# Patient Record
Sex: Male | Born: 1945 | Race: White | Hispanic: No | Marital: Married | Smoking: Former smoker
Health system: Southern US, Community
[De-identification: ages and names within clinical notes are randomized; demographics above are authoritative.]

## PROBLEM LIST (undated history)

## (undated) DIAGNOSIS — K219 Gastro-esophageal reflux disease without esophagitis: Secondary | ICD-10-CM

## (undated) DIAGNOSIS — M159 Polyosteoarthritis, unspecified: Secondary | ICD-10-CM

## (undated) DIAGNOSIS — F419 Anxiety disorder, unspecified: Secondary | ICD-10-CM

## (undated) DIAGNOSIS — M797 Fibromyalgia: Secondary | ICD-10-CM

## (undated) DIAGNOSIS — G4730 Sleep apnea, unspecified: Secondary | ICD-10-CM

## (undated) DIAGNOSIS — N529 Male erectile dysfunction, unspecified: Secondary | ICD-10-CM

## (undated) DIAGNOSIS — Z9889 Other specified postprocedural states: Secondary | ICD-10-CM

## (undated) DIAGNOSIS — IMO0001 Reserved for inherently not codable concepts without codable children: Secondary | ICD-10-CM

## (undated) DIAGNOSIS — I4891 Unspecified atrial fibrillation: Secondary | ICD-10-CM

## (undated) DIAGNOSIS — D126 Benign neoplasm of colon, unspecified: Secondary | ICD-10-CM

## (undated) DIAGNOSIS — H269 Unspecified cataract: Secondary | ICD-10-CM

## (undated) DIAGNOSIS — G4733 Obstructive sleep apnea (adult) (pediatric): Secondary | ICD-10-CM

## (undated) DIAGNOSIS — IMO0002 Reserved for concepts with insufficient information to code with codable children: Secondary | ICD-10-CM

## (undated) HISTORY — DX: Obstructive sleep apnea (adult) (pediatric): G47.33

## (undated) HISTORY — DX: Gastro-esophageal reflux disease without esophagitis: K21.9

## (undated) HISTORY — PX: COLONOSCOPY: SHX174

## (undated) HISTORY — DX: Polyosteoarthritis, unspecified: M15.9

## (undated) HISTORY — PX: POLYPECTOMY: SHX149

## (undated) HISTORY — PX: ROTATOR CUFF REPAIR: SHX139

## (undated) HISTORY — DX: Unspecified cataract: H26.9

## (undated) HISTORY — PX: LUMBAR LAMINECTOMY: SHX95

## (undated) HISTORY — DX: Sleep apnea, unspecified: G47.30

## (undated) HISTORY — DX: Benign neoplasm of colon, unspecified: D12.6

## (undated) HISTORY — DX: Other specified postprocedural states: Z98.890

## (undated) HISTORY — DX: Anxiety disorder, unspecified: F41.9

## (undated) HISTORY — DX: Reserved for inherently not codable concepts without codable children: IMO0001

## (undated) HISTORY — PX: LIPOMA EXCISION: SHX5283

## (undated) HISTORY — PX: UPPER GASTROINTESTINAL ENDOSCOPY: SHX188

## (undated) HISTORY — DX: Male erectile dysfunction, unspecified: N52.9

## (undated) HISTORY — DX: Reserved for concepts with insufficient information to code with codable children: IMO0002

## (undated) HISTORY — PX: CHONDROPLASTY: SHX5177

---

## 1999-02-11 ENCOUNTER — Observation Stay (HOSPITAL_COMMUNITY): Admission: RE | Admit: 1999-02-11 | Discharge: 1999-02-12 | Payer: Self-pay | Admitting: Specialist

## 1999-02-21 ENCOUNTER — Inpatient Hospital Stay (HOSPITAL_COMMUNITY): Admission: EM | Admit: 1999-02-21 | Discharge: 1999-02-25 | Payer: Self-pay | Admitting: Specialist

## 1999-08-19 ENCOUNTER — Ambulatory Visit: Admission: RE | Admit: 1999-08-19 | Discharge: 1999-08-19 | Payer: Self-pay | Admitting: Internal Medicine

## 2003-04-19 ENCOUNTER — Emergency Department (HOSPITAL_COMMUNITY): Admission: EM | Admit: 2003-04-19 | Discharge: 2003-04-19 | Payer: Self-pay | Admitting: Emergency Medicine

## 2003-08-03 ENCOUNTER — Emergency Department (HOSPITAL_COMMUNITY): Admission: EM | Admit: 2003-08-03 | Discharge: 2003-08-03 | Payer: Self-pay | Admitting: Emergency Medicine

## 2004-11-11 ENCOUNTER — Ambulatory Visit: Payer: Self-pay | Admitting: Internal Medicine

## 2005-01-10 ENCOUNTER — Encounter (INDEPENDENT_AMBULATORY_CARE_PROVIDER_SITE_OTHER): Payer: Self-pay | Admitting: *Deleted

## 2005-01-10 ENCOUNTER — Ambulatory Visit (HOSPITAL_COMMUNITY): Admission: RE | Admit: 2005-01-10 | Discharge: 2005-01-10 | Payer: Self-pay | Admitting: Surgery

## 2005-07-01 ENCOUNTER — Ambulatory Visit: Payer: Self-pay | Admitting: Internal Medicine

## 2005-07-03 ENCOUNTER — Ambulatory Visit: Payer: Self-pay | Admitting: Internal Medicine

## 2006-04-20 ENCOUNTER — Encounter: Admission: RE | Admit: 2006-04-20 | Discharge: 2006-04-20 | Payer: Self-pay

## 2006-11-02 ENCOUNTER — Ambulatory Visit: Payer: Self-pay | Admitting: Emergency Medicine

## 2006-11-02 LAB — CONVERTED CEMR LAB
Inflenza A Ag: NEGATIVE
Influenza B Ag: NEGATIVE

## 2007-09-09 DIAGNOSIS — D126 Benign neoplasm of colon, unspecified: Secondary | ICD-10-CM

## 2007-09-09 HISTORY — DX: Benign neoplasm of colon, unspecified: D12.6

## 2007-09-20 ENCOUNTER — Ambulatory Visit: Payer: Self-pay | Admitting: Internal Medicine

## 2007-09-21 ENCOUNTER — Encounter: Payer: Self-pay | Admitting: Internal Medicine

## 2007-09-21 ENCOUNTER — Ambulatory Visit: Payer: Self-pay | Admitting: Internal Medicine

## 2008-04-03 ENCOUNTER — Ambulatory Visit: Payer: Self-pay | Admitting: Internal Medicine

## 2008-04-03 DIAGNOSIS — J449 Chronic obstructive pulmonary disease, unspecified: Secondary | ICD-10-CM

## 2008-04-03 DIAGNOSIS — J309 Allergic rhinitis, unspecified: Secondary | ICD-10-CM | POA: Insufficient documentation

## 2008-04-03 DIAGNOSIS — G4733 Obstructive sleep apnea (adult) (pediatric): Secondary | ICD-10-CM

## 2009-06-07 ENCOUNTER — Ambulatory Visit: Payer: Self-pay | Admitting: Internal Medicine

## 2009-09-11 ENCOUNTER — Ambulatory Visit: Payer: Self-pay | Admitting: Internal Medicine

## 2009-10-08 ENCOUNTER — Encounter: Payer: Self-pay | Admitting: Internal Medicine

## 2009-11-29 ENCOUNTER — Encounter: Payer: Self-pay | Admitting: Internal Medicine

## 2009-12-03 ENCOUNTER — Ambulatory Visit: Payer: Self-pay | Admitting: Internal Medicine

## 2009-12-03 DIAGNOSIS — R079 Chest pain, unspecified: Secondary | ICD-10-CM | POA: Insufficient documentation

## 2009-12-13 ENCOUNTER — Ambulatory Visit: Payer: Self-pay | Admitting: Internal Medicine

## 2009-12-27 ENCOUNTER — Ambulatory Visit: Payer: Self-pay | Admitting: Cardiology

## 2009-12-27 DIAGNOSIS — R0602 Shortness of breath: Secondary | ICD-10-CM

## 2010-01-08 ENCOUNTER — Ambulatory Visit (HOSPITAL_COMMUNITY): Admission: RE | Admit: 2010-01-08 | Discharge: 2010-01-08 | Payer: Self-pay | Admitting: Cardiology

## 2010-01-08 ENCOUNTER — Ambulatory Visit: Payer: Self-pay | Admitting: Cardiology

## 2010-01-21 ENCOUNTER — Telehealth: Payer: Self-pay | Admitting: Cardiology

## 2010-10-06 LAB — CONVERTED CEMR LAB: Pro B Natriuretic peptide (BNP): <30 pg/mL (ref 0.0–100.0)

## 2010-10-08 NOTE — Assessment & Plan Note (Signed)
Summary: np6/ chestpain./ pt has medicare/ gd  Medications Added MULTIVITAMINS   TABS (MULTIPLE VITAMIN) 1 po daily COQ10 30 MG CAPS (COENZYME Q10) 1 by mouth daliy VITAMIN D 1000 UNIT TABS (CHOLECALCIFEROL) 1 podaily PROBIOTIC  CAPS (PROBIOTIC PRODUCT) 1 by mouth daily ZEGERID OTC 20-1100 MG CAPS (OMEPRAZOLE-SODIUM BICARBONATE) 1 by mouth daily PROAIR HFA 108 (90 BASE) MCG/ACT AERS (ALBUTEROL SULFATE) as needed      Allergies Added:   Visit Type:  Initial Consult Referring Provider:  Fannie Knee, MD Primary Provider:  Shary Decamp, MD  CC:  Dyspnea and Chest Pain.  History of Present Illness: The patient presents with chest discomfort. This has been going on for approximately 3-4 weeks. It happens more often in the morning. It is sporadic. He describes a left-sided discomfort that is 3/10 in intensity. It's heavy. It might go away after about 30 minutes. His wife thinks that there is some anxiety involved.  He does not describe associated symptoms such as nausea vomiting or diaphoresis. Presented palpitations, presyncope or syncope. He also has been having shortness of breath for about 3 or 4 months. This is slowly progressive. It happens when he is trying to rise sure when he is walking at least a moderate distance on level ground. He does not describe any PND or orthopnea. He doesn't describe any palpitations, presyncope or syncope. He does sleep with CPAP. His last stress test was a perfusion study in 2002.  Current Medications (verified): 1)  Benefiber   Powd (Wheat Dextrin) .... As Needed 2)  Cpap 12 Cwp Advanced 3)  Vicodin 5-500 Mg Tabs (Hydrocodone-Acetaminophen) .... Take 1 By Mouth Every 6 Hours As Needed Pain 4)  Multivitamins   Tabs (Multiple Vitamin) .Marland Kitchen.. 1 Po Daily 5)  Coq10 30 Mg Caps (Coenzyme Q10) .Marland Kitchen.. 1 By Mouth Daliy 6)  Vitamin D 1000 Unit Tabs (Cholecalciferol) .Marland Kitchen.. 1 Podaily 7)  Probiotic  Caps (Probiotic Product) .Marland Kitchen.. 1 By Mouth Daily 8)  Zegerid Otc 20-1100  Mg Caps (Omeprazole-Sodium Bicarbonate) .Marland Kitchen.. 1 By Mouth Daily 9)  Proair Hfa 108 (90 Base) Mcg/act Aers (Albuterol Sulfate) .... As Needed  Allergies (verified): 1)  ! Pcn 2)  ! Prilosec 3)  ! Hydrocodone 4)  ! Codeine  Past History:  Past Medical History: Last updated: 04/03/2008 Allergic Rhinitis Bronchitis Sleep Apnea  Past Surgical History: Right rotator cuff- staph infection post op Lumbar laminectomy Lipoma from back  Social History: Wife was respiratory therapist, he is disabled from multiple orthopedic problems. Quit smoking- 1996  Review of Systems       Positive for constipation, headaches, 25 pound weight gain in 2 years, situational depression, reflux, joint pains. Otherwise as stated in the history of present illness negative for all other systems.   Vital Signs:  Patient profile:   65 year old male Height:      70.5 inches Weight:      238 pounds BMI:     33.79 Pulse rate:   49 / minute Resp:     18 per minute BP sitting:   148 / 78  (right arm)  Vitals Entered By: Marrion Coy, CNA (December 27, 2009 2:30 PM)  Physical Exam  General:  Well developed, well nourished, in no acute distress. Head:  normocephalic and atraumatic Eyes:  PERRLA/EOM intact; conjunctiva and lids normal. Mouth:  Teeth, gums and palate normal. Oral mucosa normal. Neck:  Neck supple, no JVD. No masses, thyromegaly or abnormal cervical nodes. Chest Wall:  no deformities or breast  masses noted Lungs:  Clear bilaterally to auscultation and percussion. Abdomen:  Bowel sounds positive; abdomen soft and non-tender without masses, organomegaly, or hernias noted, obese Msk:  Back normal, normal gait. Muscle strength and tone normal. Extremities:  No clubbing or cyanosis. Neurologic:  Alert and oriented x 3. Skin:  Intact without lesions or rashes. Cervical Nodes:  no significant adenopathy Axillary Nodes:  no significant adenopathy Inguinal Nodes:  no significant adenopathy Psych:   Normal affect.   Detailed Cardiovascular Exam  Neck    Carotids: Carotids full and equal bilaterally without bruits.      Neck Veins: Normal, no JVD.    Heart    Inspection: no deformities or lifts noted.      Palpation: normal PMI with no thrills palpable.      Auscultation: regular rate and rhythm, S1, S2 without murmurs, rubs, gallops, or clicks.    Vascular    Abdominal Aorta: no palpable masses, pulsations, or audible bruits.      Femoral Pulses: normal femoral pulses bilaterally.      Pedal Pulses: normal pedal pulses bilaterally.      Radial Pulses: normal radial pulses bilaterally.      Peripheral Circulation: no clubbing, cyanosis, or edema noted with normal capillary refill.     EKG  Procedure date:  12/27/2009  Findings:      sinus bradycardia, rate 49, axis within normal limits, RSR prime V1, no acute ST-T wave changes  Impression & Recommendations:  Problem # 1:  CHEST PAIN (ICD-786.50) The patient's chest pain is somewhat atypical. However, he has risk factors including a family history. I would like to perform an exercise treadmill test that he could not ambulate because of his joint pains. Therefore, I would consider pharmacologic perfusion imaging. Orders: EKG w/ Interpretation (93000) Cardiac CTA (Cardiac CTA)  Problem # 2:  SHORTNESS OF BREATH (ICD-786.05) I will check a BNP level. This had the study above are normal then I would not suggest a cardiac etiology for his dyspnea. Rather it would be related to obesity and sedentary lifestyle Orders: EKG w/ Interpretation (93000) T-BNP  (B Natriuretic Peptide) (04540-98119)  Patient Instructions: 1)  Your physician recommends that you schedule a follow-up appointment as directed 2)  Your physician recommends that you have a BNP today 786.05 3)  Your physician recommends that you continue on your current medications as directed. Please refer to the Current Medication list given to you today.

## 2010-10-08 NOTE — Progress Notes (Signed)
Summary: ct scan    Phone Note Call from Patient Call back at Home Phone [phone] Call back at [phone]   Caller: Spouse - patricia Reason for Call: Talk to Nurse, Lab or Test Results Details for Reason: ct scan, pt in a research study.  Initial call taken by: Lorne Skeens,  Jan 21, 2010 4:40 PM  Follow-up for Phone Call        I called the pt's cell # and got the ok to info to his wife. The pt's wife if aware of his results.  Follow-up by: Sherri Rad, RN, BSN,  Jan 21, 2010 5:17 PM

## 2010-10-08 NOTE — Assessment & Plan Note (Signed)
Summary: sob/jd   Primary Provider/Referring Provider:  Shary Decamp  CC:  Accute Visit-Increased SOB-new onset; mild chest pain non raidating and increased pressure. Marland Kitchen  History of Present Illness:  SLEEP APNEA (ICD-780.57) Hx of BRONCHITIS (ICD-491.20) ALLERGIC RHINITIS (ICD-477.9) 04/03/08- 65 year old man returning with his wife for follow-up.  History of sleep apnea, and allergic rhinitis.  Acute visit today because of cough and bronchitis.  They stayed for 3 or 4 days.  He has been coughing up "nasty stuff.".  Denies fever, chills, nausea, and vomiting, adenopathy, and rash.  Was caught in rain and chilled last week, shortly before this began.  June 07, 2009--Presents for an acute office viist. Pt c/o low grade fever, productive cough with yellow mucus, runny nose, and facial pressure  x 2 to 3 days. Complains that symptoms bad last night.  Used otc no help. Denies chest pain, dyspnea, orthopnea, hemoptysis, fever, n/v/d, edema, headache.   September 11, 2009--Presents for for an acute office visit. Complains of sinus pressure/congestion with yellow/ mixed with bright red drainage, HA x3weeks - denies f/c/s. Denies chest pain, dyspnea, orthopnea, hemoptysis, fever, n/v/d, edema, headache,recent travel or antibiotics.   December 03, 2009- OSA, Bronchitis, Allergic rhinitis.......................Marland Kitchenwife here( former Buyer, retail) Wife is more articulate and outgoing than he. She gave up her appointment to get him in today. They report intervals of dyspnea with minor BP interventions with stress. 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 He notes no changes with weather. It seems exertional, noted with sustained walking . This is happening more frequently than when it began 2 months ago, but still episodic.. Gets twinges in left precordial pain- comes and goes, not exertional. He had no problem during 2 week hunting vacation.Drinks  large cups of coffee in AM.   Had normal stress test about 8-9 years ago for chest tightness attributed then to Vioxx. Continues regular compliant use of CPAP at 12.          Preventive Screening-Counseling & Management  Alcohol-Tobacco     Smoking Status: quit  Current Medications (verified): 1)  Benefiber   Powd (Wheat Dextrin) .... As Needed 2)  Cpap-Ahc Set On 12 .... Wear At Bedtime 3)  Vicodin 5-500 Mg Tabs (Hydrocodone-Acetaminophen) .... Take 1 By Mouth Every 6 Hours As Needed Pain  Allergies (verified): 1)  ! Pcn 2)  ! Prilosec 3)  ! Hydrocodone 4)  ! Codeine  Past History:  Past Medical History: Last updated: 04/03/2008 Allergic Rhinitis Bronchitis Sleep Apnea  Family History: Last updated: 12/03/2009 Cancer-mother Family History Diabetes-aunt Mother- died metastatic breast cancer Father- died MI age 33  Social History: Last updated: 12/03/2009 Wife was respiratory therapist, disabled by post-polio syndrome Quit smoking- 1996  Risk Factors: Smoking Status: quit (12/03/2009) Packs/Day: 1.5 (06/07/2009)  Past Surgical History: Right roator cuff- staph infection lumbar laminectomy Lipoma from back  Family History: Cancer-mother Family History Diabetes-aunt Mother- died metastatic breast cancer Father- died MI age 52  Social History: Wife was respiratory therapist, 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  The patient denies anorexia, fever, weight loss, weight gain, vision loss, decreased hearing, hoarseness, syncope, peripheral edema, prolonged cough, headaches, hemoptysis, abdominal pain, and severe indigestion/heartburn.    Vital Signs:  Patient profile:   65 year old male Height:  70.5 inches Weight:      246.38 pounds BMI:     34.98 O2 Sat:      94 % on Room air Pulse rate:   60 / minute BP sitting:   112 / 78   (left arm) Cuff size:   regular  Vitals Entered By: Reynaldo Minium CMA (December 03, 2009 3:39 PM)  O2 Flow:  Room air  Physical Exam  Additional Exam:  GENERAL:  A/Ox3; pleasant & cooperative.NAD, comfortable appearoing HEENT:  [place]/AT, EOM-wnl,  EACs-clear, TMs-wnl, NOSE- red, irritated  THROAT-clear & wnl., tonsils, Mallampati  II NECK:  Supple w/ fair ROM; no JVD; normal carotid impulses w/o bruits; no thyromegaly or nodules palpated; no lymphadenopathy. CHEST: clear to P&A HEART:  RRR, no m/r/g  heard ABDOMEN:  Soft & nt; nml bowel sounds; no organomegaly or masses detected. EXT: Warm bilat,  no calf pain, edema, clubbing, pulses intact Skin: no rash/lesion     Pulmonary Function Test Date: 12/03/2009 4:50 PM Gender: Male  Pre-Spirometry FVC    Value: 8.64 L/min   % Pred: 187.10 % FEV1    Value: 7.81 L     Pred: 3.47 L     % Pred: 225.40 % FEV1/FVC  Value: 90.39 %     % Pred: 120.40 %  Impression & Recommendations:  Problem # 1:  CHEST PAIN (ICD-786.50)  Nonspecific chest pains and dyspnea. I favor stress and some esophagitis. The ddx includes angina and less likely- some bronchitis. We will get spirometry now and schedule a 6 MWT with trial of bronchodilator. He will try Zegerid for a month. Since it is potentially the most acutely dangerous dx, I will ask him to see cardiology for consideration of angina work-up.  Medications Added to Medication List This Visit: 1)  Cpap-ahc Set On 12  .... Wear at bedtime 2)  Cpap 12 Cwp Advanced  3)  Vicodin 5-500 Mg Tabs (Hydrocodone-acetaminophen) .... Take 1 by mouth every 6 hours as needed pain  Other Orders: Est. Patient Level II (64332) Cardiology Referral (Cardiology)  Patient Instructions: 1)  Please schedule a follow-up appointment in 1 year. 2)  See [facility] to schedule cardiology appointment 3)  sample rescue inhlaer: 2 puffs four times a day as needed for shortness of breath 4)  Try an otc acid blocker like Zegerid 5)   Schedule 6 MWT 6)  Office spirometry     [employer] Spirometry  ID: [id number] Patient: Joseph Mcdaniel, Joseph Mcdaniel DOB: 1945-09-17 Age: 65 Years Old Sex: Male Race: White Physician: Young,Clinton Height: 70.5 Weight: 246.38 Smoker: No PPD: 1.5 Has Pacemaker? No Status: Unconfirmed Past Medical History:  Allergic Rhinitis Bronchitis Sleep Apnea  Recorded: 12/03/2009 4:50 PM  Parameter  Measured Predicted %Predicted FVC     8.64        4.62        187.10 FEV1     7.81        3.47        225.40 FEV1%   90.39        75.05        120.40 PEF    16.12        8.92        180.70   Interpretation:

## 2010-10-08 NOTE — Letter (Signed)
Summary: CMN for CPAP Supplies/[facility]  CMN for CPAP Supplies/[facility]   Imported By: Sherian Rein 10/11/2009 13:26:58  _____________________________________________________________________  External Attachment:    Type:   Image     Comment:   External Document

## 2010-10-08 NOTE — Assessment & Plan Note (Signed)
Summary: Acute NP office visit - sinus infection   Primary Provider/Referring Provider:  Dr. Phylliss Bob  CC:  sinus pressure/congestion with yellow/bright red drainage and HA x3weeks - denies f/c/s.  History of Present Illness: Current Problems:  SLEEP APNEA (ICD-780.57) Hx of BRONCHITIS (ICD-491.20) ALLERGIC RHINITIS (ICD-477.9)  65 yo male with known history fo  sleep apnea, and allergic rhinitis.     June 07, 2009--Presents for an acute office viist. Pt c/o low grade fever, productive cough with yellow mucus, runny nose, and facial pressure  x 2 to 3 days. Complains that symptoms bad last night.  Used otc no help. Denies chest pain, dyspnea, orthopnea, hemoptysis, fever, n/v/d, edema, headache.   September 11, 2009--Presents for for an acute office visit. Complains of sinus pressure/congestion with yellow/ mixed with bright red drainage, HA x3weeks - denies f/c/s. Denies chest pain, dyspnea, orthopnea, hemoptysis, fever, n/v/d, edema, headache,recent travel or antibiotics.     Medications Prior to Update: 1)  Darvocet A500 100-500 Mg  Tabs (Propoxyphene N-Apap) .... As Needed 2)  Benefiber   Powd (Wheat Dextrin) .... As Needed 3)  Cpap 4)  Zithromax Z-Pak 250 Mg Tabs (Azithromycin) .... Take As Directed.  Current Medications (verified): 1)  Darvocet A500 100-500 Mg  Tabs (Propoxyphene N-Apap) .... As Needed 2)  Benefiber   Powd (Wheat Dextrin) .... As Needed 3)  Cpap .... Wear At Bedtime  Allergies (verified): 1)  ! Pcn 2)  ! Prilosec 3)  ! Hydrocodone 4)  ! Codeine  Past History:  Past Medical History: Last updated: 04/03/2008 Allergic Rhinitis Bronchitis Sleep Apnea  Family History: Last updated: 06/07/2009 Cancer-mother Family History Diabetes-aunt  Social History: Last updated: 06/07/2009 Wife was respiratory therapist, disabled by post-polio syndrome Patient states former smoker.   Risk Factors: Smoking Status: quit (06/07/2009) Packs/Day: 1.5  (06/07/2009)  Review of Systems      See HPI  Vital Signs:  Patient profile:   65 year old male Height:      70.5 inches Weight:      231.25 pounds BMI:     32.83 O2 Sat:      97 % on Room air Temp:     98.9 degrees F oral Pulse rate:   52 / minute BP sitting:   108 / 64  (left arm) Cuff size:   regular  Vitals Entered By: Boone Master CNA (September 11, 2009 3:29 PM)  O2 Flow:  Room air CC: sinus pressure/congestion with yellow/bright red drainage, HA x3weeks - denies f/c/s Is Patient Diabetic? No Comments Medications reviewed with patient Daytime contact number verified with patient. Boone Master CNA  September 11, 2009 3:29 PM    Physical Exam  Additional Exam:  GENERAL:  A/Ox3; pleasant & cooperative.NAD HEENT:  [place]/AT, EOM-wnl,  EACs-clear, TMs-wnl, NOSE- red, irritated  THROAT-clear & wnl. NECK:  Supple w/ fair ROM; no JVD; normal carotid impulses w/o bruits; no thyromegaly or nodules palpated; no lymphadenopathy. CHEST: Mild rhonchi HEART:  RRR, no m/r/g  heard ABDOMEN:  Soft & nt; nml bowel sounds; no organomegaly or masses detected. EXT: Warm bilat,  no calf pain, edema, clubbing, pulses intact Skin: no rash/lesion     Impression & Recommendations:  Problem # 1:  Hx of BRONCHITIS (ICD-491.20)  Flare with asscoicated sinusitis REC:  Zpack take as directed.  Mucinex DM two times a day as needed cough/congestion  Saline nasal rinses as needed  .Increase fluids.  Please contact office for sooner follow up if symptoms  do not improve or worsen   Orders: Est. Patient Level IV (84696)  Medications Added to Medication List This Visit: 1)  Cpap  .... Wear at bedtime 2)  Zithromax Z-pak 250 Mg Tabs (Azithromycin) .... Take as directed.  Complete Medication List: 1)  Darvocet A500 100-500 Mg Tabs (Propoxyphene n-apap) .... As needed 2)  Benefiber Powd (Wheat dextrin) .... As needed 3)  Cpap  .... Wear at bedtime 4)  Zithromax Z-pak 250 Mg Tabs (Azithromycin)  .... Take as directed.  Patient Instructions: 1)  Zpack take as directed.  2)  Mucinex DM two times a day as needed cough/congestion  3)  Saline nasal rinses as needed  4)  .Increase fluids.  5)  Please contact office for sooner follow up if symptoms do not improve or worsen  Prescriptions: ZITHROMAX Z-PAK 250 MG TABS (AZITHROMYCIN) take as directed.  #1 x 0   Entered and Authorized by:   Rubye Oaks NP   Signed by:   Rubye Oaks NP on 09/11/2009   Method used:   Electronically to        [facility] [address] (retail)       [id number]       [place], [place]  [address]       Ph: [phone]       Fax: [phone]   RxID:   [id number]    Not Administered:    Influenza Vaccine not given due to: declined

## 2010-10-08 NOTE — Assessment & Plan Note (Signed)
Summary: chest & head congestion/apc   Primary Provider/Referring Provider:  Shary Decamp  CC:  Sinus congestion/pressure; yellow drainage x 2days. Sinus headache as well.Marland Kitchen  History of Present Illness: June 07, 2009--Presents for an acute office viist. Pt c/o low grade fever, productive cough with yellow mucus, runny nose, and facial pressure  x 2 to 3 days. Complains that symptoms bad last night.  Used otc no help. Denies chest pain, dyspnea, orthopnea, hemoptysis, fever, n/v/d, edema, headache.   September 11, 2009--Presents for for an acute office visit. Complains of sinus pressure/congestion with yellow/ mixed with bright red drainage, HA x3weeks - denies f/c/s. Denies chest pain, dyspnea, orthopnea, hemoptysis, fever, n/v/d, edema, headache,recent travel or antibiotics.   December 03, 2009- OSA, Bronchitis, Allergic rhinitis.......................Marland Kitchenwife here( former Buyer, retail) Wife is more articulate and outgoing than he. She gave up her appointment to get him in today. They report intervals of dyspnea with minor BP interventions with stress. 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 He notes no changes with weather. It seems exertional, noted with sustained walking . This is happening more frequently than when it began 2 months ago, but still episodic.. Gets twinges in left precordial pain- comes and goes, not exertional. He had no problem during 2 week hunting vacation.Drinks large cups of coffee in AM.   Had normal stress test about 8-9 years ago for chest tightness attributed then to Vioxx. Continues regular compliant use of CPAP at 12.  December 13, 2009- OSA, bronchitis, Allergic rhinitis Returns for f/u dyspnea and chest pains. Acid blocker seems to have relieved this- c/w GERD etiology. Reports sinus pressure, yellow nasal discharge, postnasal drip. Harder to breathe through his nose with his CPAP. This all began during  night yesterday. Denies fever or head congestion.  Current Medications (verified): 1)  Benefiber   Powd (Wheat Dextrin) .... As Needed 2)  Cpap 12 Cwp Advanced 3)  Vicodin 5-500 Mg Tabs (Hydrocodone-Acetaminophen) .... Take 1 By Mouth Every 6 Hours As Needed Pain  Allergies (verified): 1)  ! Pcn 2)  ! Prilosec 3)  ! Hydrocodone 4)  ! Codeine  Past History:  Past Medical History: Last updated: 04/03/2008 Allergic Rhinitis Bronchitis Sleep Apnea  Family History: Last updated: 12/03/2009 Cancer-mother Family History Diabetes-aunt Mother- died metastatic breast cancer Father- died MI age 18  Social History: Last updated: 12/03/2009 Wife was respiratory therapist, disabled by post-polio syndrome Quit smoking- 1996  Risk Factors: Smoking Status: quit (12/03/2009) Packs/Day: 1.5 (06/07/2009)  Past Surgical History: Right rotator cuff- staph infection post op lumbar laminectomy Lipoma from back  Review of Systems      See HPI  The patient denies anorexia, fever, weight loss, weight gain, vision loss, decreased hearing, hoarseness, chest pain, syncope, dyspnea on exertion, peripheral edema, prolonged cough, headaches, hemoptysis, and severe indigestion/heartburn.    Vital Signs:  Patient profile:   65 year old male Height:      70.5 inches Weight:      245.25 pounds BMI:     34.82 O2 Sat:      95 % on Room air Pulse rate:   61 / minute BP sitting:   122 / 80  (left arm) Cuff size:   large  Vitals Entered By: Reynaldo Minium CMA (December 13, 2009 8:59 AM)  O2 Flow:  Room air  Physical Exam  Additional Exam:  GENERAL:  A/Ox3; pleasant & cooperative.NAD, comfortable appearoing HEENT:  [place]/AT, EOM-wnl,  EACs-clear, TMs-wnl, NOSE- moderate turbinate edema with congestion  THROAT-clear & wnl., tonsils, Mallampati  II NECK:  Supple w/ fair ROM; no JVD; normal carotid impulses w/o bruits; no thyromegaly or nodules palpated; no lymphadenopathy. CHEST: clear to P&A HEART:   RRR, no m/r/g  heard ABDOMEN:  Soft & nt; nml bowel sounds; no organomegaly or masses detected. EXT: Warm bilat,  no calf pain, edema, clubbing, pulses intact Skin: no rash/lesion     Impression & Recommendations:  Problem # 1:  ALLERGIC RHINITIS (ICD-477.9)  Acute exacerbation of seasonal rhinitis with probable developing rhinosinusisits. We will give neb neo, depo 80, doxycycline and decongestant antihistamine   Medications Added to Medication List This Visit: 1)  Doxycycline Hyclate 100 Mg Caps (Doxycycline hyclate) .... 2 today then one daily  Other Orders: Est. Patient Level II (16109) Admin of Therapeutic Inj  intramuscular or subcutaneous (60454) Depo- Medrol 80mg  (J1040) Nebulizer Tx (09811)  Patient Instructions: 1)  Please schedule a follow-up appointment in 1 year. 2)  Neb neo nasal 3)  depo 80 4)  script for antibiotic doxycycline sent to drug store 5)  Try an antihistamine-decongestant like claritin-D, or combine an otc decongestamnt like Sudafed-PE with your plain antihistamine. if needed Prescriptions: DOXYCYCLINE HYCLATE 100 MG CAPS (DOXYCYCLINE HYCLATE) 2 today then one daily  #8 x 0   Entered and Authorized by:   Waymon Budge MD   Signed by:   Waymon Budge MD on 12/13/2009   Method used:   Electronically to        [facility] [address] (retail)       [phone]       [place], [place]  [address]       Ph: [phone]       Fax: [phone]   RxID:   [id number]    Medication Administration  Injection # 1:    Medication: Depo- Medrol 80mg     Diagnosis: ALLERGIC RHINITIS (ICD-477.9)    Route: IM    Site: RUOQ gluteus    Exp Date: 07/2012    Lot #: [id number]    Mfr: Pharmacia    Patient tolerated injection without complications    Given by: Reynaldo Minium CMA (December 13, 2009 5:22 PM)  Medication # 1:    Medication: EMR miscellaneous medications    Diagnosis: ALLERGIC RHINITIS (ICD-477.9)    Dose:  3drops    Route: intranasal    Exp Date: 01/2011    Lot #: [id number]    Mfr: bayer    Comments: Neo-Synephrine    Patient tolerated medication without complications    Given by: Reynaldo Minium CMA (December 13, 2009 5:23 PM)  Orders Added: 1)  Est. Patient Level II [95638] 2)  Admin of Therapeutic Inj  intramuscular or subcutaneous [96372] 3)  Depo- Medrol 80mg  [J1040] 4)  Nebulizer Tx [75643]

## 2011-01-21 NOTE — Assessment & Plan Note (Signed)
[facility]                         GASTROENTEROLOGY OFFICE NOTE   HUMZA, TALLERICO                    MRN:          [id number]  DATE:09/20/2007                            DOB:          1945-12-01    [name] is a 65 year old gentleman referred actually by his wife  for evaluation of change in his bowel habits for constipation.  The  change has occurred in the last 8 months.  He had regular bowel habits  until last year, having 1 bowel movement daily.  Now he may go 2 or 3  days without a bowel movement.  He has responded to increasing the fiber  in his diet and taking Benefiber about once a week, drinking juice in  the mornings.  He has never had a colonoscopy.  Mr. Spahr has been  Dr. Harriette Ohara patient and Dr.Tom Phylliss Bob 's patient for fibromyalgia  and degenerative joint disease.  He also has obstructive sleep apnea for  which he uses CPAP at night.  He has asthmatic bronchitis.  He is an ex-  smoker.   MEDICATIONS:  1. Aspirin one p.o. daily.  2. Glucosamine chondroitin daily.  3. Geritol one p.o. daily.  4. Vitamin  antioxidant.  5. Flora Smart Probiotic one p.o. daily.  6. Benefiber every 2 weeks.  7. He also takes Meprozine and Darvocet for pain.   PAST HISTORY:  Significant for:  1. Sleep apnea.  2. He had herniated disk status post laminectomy 1984.  3. Right rotator cuff surgery in 2000.   FAMILY HISTORY:  1. Negative for colon cancer.  2. Mother had breast cancer.  3. Father heart disease.  4. Maternal grandmother leukemia.   SOCIAL HISTORY:  1. Married with 3 stepchildren.  2. He is a retired Visual merchandiser.  In fact, he installed carpets in      our office in 1989.  He blames his profession to having multiple      arthritic complaints, especially in his knees.  He does not smoke      and drinks alcohol only occasionally.   REVIEW OF SYSTEMS:  Positive for reading glasses, allergies, arthritic  joint  pains, sleeping problems, vision changes, back pain, hearing  problems and muscle pain.   PHYSICAL EXAM:  Blood pressure 126/54, pulse 60 and weight 234 pounds.  She was alert, oriented, no distress.  Sclera is nonicteric, oral cavity  was normal.  NECK:  Supple, no adenopathy, no bruit.  LUNGS:  Clear to auscultation.  No wheezes or rales.  COR:  With a quiet precordium, hardly audible S1 S2.  No murmur.  ABDOMEN:  Soft, relaxed with normoactive bowel sounds, no surgical  scars, liver edge at costal margin.  Lower abdomen was normal.  I could  not elicit any tenderness, pain or fullness.  RECTAL EXAM:  Normal extra tones, there was a small amount of brown  Hemoccult negative stool in the ampulla.  EXTREMITIES:  No edema.   IMPRESSION:  A 65 year old gentleman with new onset of constipation  likely related to use of analgesics for control of his DJD as well as  possibly due to decreased activity, again secondary to fibromyalgia.  He  has no risk factors for colon cancer other than his age of 23.  He is a  good candidate for screening colonoscopy since he has never had an exam.   PLAN:  1. We have discussed high fiber diet.  I gave him outline of the high      fiber diet for him to follow.  2. Samples of Benefiber to use on daily basis rather than every 2      weeks.  3. Increase activity as much as possible.  4. Colonoscopy scheduled.  I have discussed the procedure, sedation as      well as prep with the patient.     Joseph Mcdaniel. Juanda Chance, MD  Electronically Signed    DMB/MedQ  DD: 09/20/2007  DT: 09/20/2007  Job #: [id number]   cc:   Areatha Keas, M.D.  Clinton D. Maple Hudson, MD, FCCP, FACP

## 2011-01-24 NOTE — Assessment & Plan Note (Signed)
[facility]                             PULMONARY OFFICE NOTE   Joseph, Mcdaniel                    MRN:          [id number]  DATE:11/02/2006                            DOB:          Dec 06, 1945    SUBJECTIVE:  Joseph Mcdaniel is a 65 year old man followed by Dr. Maple Hudson in  our office for obstructive sleep apnea and allergic rhinitis.  He had  been doing fairly well until yesterday when he began to experience  fevers, myalgias, joint pain and nonproductive cough.  His cough has  persisted and he is now having midsternal pleuritic chest pain when he  coughs.  He has continued to have nausea with some vomiting.  He is also  developing some diarrhea.  He had a fever last night and has had some  chills as well.  He is not currently taking any medications beyond  p.r.n. pain meds.   EXAMINATION:  GENERAL:  This is a slightly ill-appearing but comfortable  gentleman who is in no distress.  His weight is 234 pounds.  Temperature 101.8.  Blood pressure 128/74.  Heart rate 92.  SPO2 98% on room air.  LUNGS:  Are clear to auscultation bilaterally.  He has no crackles or  wheezing.  ABDOMEN:  Is obese, soft, nontender.  Positive bowel sounds.  HEART:  Has a regular rate and rhythm without murmur.  EXTREMITIES:  Have no cyanosis, clubbing or edema.  I do not feel any  joint effusions.   IMPRESSION:  A 65 year old man with an acute viral syndrome with  myalgias, fever, gastrointestinal symptoms and upper respiratory  symptoms,  all of this is consistent with probable influenza.  Since he  had the onset of symptoms only 18 hours ago, I would like to start  Tamiflu for the next 5 days.  We will check an influenza A and B screen  now, otherwise we will treat him symptomatically, including Tylenol and  decongestants for his upper respiratory symptoms as well as Phenergan  for his nausea.  I have asked him to call me back if he is no better by  the end of the  week or if he worsens in any way.     Leslye Peer, MD  Electronically Signed    RSB/MedQ  DD: 11/02/2006  DT: 11/02/2006  Job #: [id number]   cc:   Joni Fears D. Maple Hudson, MD, FCCP, FACP

## 2011-01-24 NOTE — Op Note (Signed)
NAMEEDIEL, UNANGST NO.:  [id number]   MEDICAL RECORD NO.:  [id number]          PATIENT TYPE:  OIB   LOCATION:  2899                         FACILITY:  [facility]   PHYSICIAN:  Currie Paris, M.D.DATE OF BIRTH:  September 18, 1945   DATE OF PROCEDURE:  01/09/2005  DATE OF DISCHARGE:                                 OPERATIVE REPORT   PREOPERATIVE DIAGNOSIS:  Large lipoma right flank.   POSTOPERATIVE DIAGNOSIS:  Large lipoma right flank.   OPERATION:  Removal large right flank lipoma.   SURGEON:  Currie Paris, M.D.   ANESTHESIA:  General anesthesia.   CLINICAL HISTORY:  This patient is a 65 year old gentleman with a large mass  of the right flank which clinically appeared to be lipoma.  Over many years  it has gradually enlarged and is such that it is difficult for him to sleep  because as he rolls on this, it causes discomfort and wakes him up.  It also  gives him a lot of pressure sensations.   DESCRIPTION OF PROCEDURE:  The patient was seen in the holding area and had  no further questions.  The mass was identified by the patient and marked and  initialed by me.   He was taken to the operating room and after satisfactory general  endotracheal anesthesia had been obtained, he was turned on his left side  and appropriately padded.  The area over the lipoma was clipped, prepped and  draped.  The time out occurred.   I made an elliptical incision because he had a lot of excess skin over the  mass.  Subcutaneous tissue was divided and a fairly well circumscribed large  lipoma was identified and easily, bluntly dissected out of the subcutaneous  tissues using a combination of blunt dissection and some cautery.  It came  off of the muscle fascia nicely.  Once it was removed, I infiltrated some  Marcaine both into the muscle and into the subcutaneous tissues around the  scar to see if we could diminish his postoperative pain.   I then spent a long time  closing, using 3-0 Vicryls to tack the subcutaneous  tissue down to the muscle to diminish the likelihood of a postoperative  seroma.  I put about three rows on either side to tack this down. Then the  subcu was closed along the incision line, closing the inferior part of the  incision to the superior part.  Again, tacking the subcu  down to the muscle.  This creates a nice closure.  I then closed the skin  using a 3-0 Monocryl subcuticular plus some Dermabond.  Sterile dressings  were then applied.   The patient tolerated the procedure well.  There were no operative  complications.  All counts were correct.      CJS/MEDQ  D:  01/10/2005  T:  01/10/2005  Job:  [id number]   cc:   Joni Fears D. Maple Hudson, M.D.

## 2011-10-23 ENCOUNTER — Telehealth: Payer: Self-pay | Admitting: Internal Medicine

## 2011-10-23 NOTE — Telephone Encounter (Signed)
Spoke with Dennie Bible at Dr. Dimas Millin office ([facility]). Patient was seen today for constipation and was started on Miralax daily. Wants to be scheduled before next available. Scheduled with Mike Gip, PA on 10/28/11 at 9:45/10;00 AM. She will fax records

## 2011-10-28 ENCOUNTER — Ambulatory Visit: Payer: Self-pay | Admitting: Physician Assistant

## 2011-10-29 ENCOUNTER — Other Ambulatory Visit (INDEPENDENT_AMBULATORY_CARE_PROVIDER_SITE_OTHER): Payer: Medicare Other

## 2011-10-29 ENCOUNTER — Encounter: Payer: Self-pay | Admitting: Physician Assistant

## 2011-10-29 ENCOUNTER — Ambulatory Visit (INDEPENDENT_AMBULATORY_CARE_PROVIDER_SITE_OTHER): Payer: Medicare Other | Admitting: Physician Assistant

## 2011-10-29 DIAGNOSIS — R198 Other specified symptoms and signs involving the digestive system and abdomen: Secondary | ICD-10-CM

## 2011-10-29 DIAGNOSIS — Z86010 Personal history of colonic polyps: Secondary | ICD-10-CM

## 2011-10-29 LAB — TSH: TSH: 1.17 u[IU]/mL (ref 0.35–5.50)

## 2011-10-29 MED ORDER — PEG-KCL-NACL-NASULF-NA ASC-C 100 G PO SOLR: 1.0000 | Freq: Once | ORAL | Status: AC

## 2011-10-29 NOTE — Progress Notes (Signed)
Subjective:    Patient ID: Joseph Mcdaniel, male    DOB: 1946/05/22, 66 y.o.   MRN: [id number]  HPI Joseph Mcdaniel is a 66 year old male known to Dr. Juanda Chance  from prior colonoscopy which was done in January of 2009. He did have one adenomatous polyp removed and was planned for 5 year followup. He comes in today because of a change in his bowel habits over the past 2 months. His wife gives most of the history and states that he has had a significant change in his bowel habits at least over the past 2 months. He had always been very regular but is at this point requiring MiraLax on a daily basis. MiraLax appears to be working and he is having a bowel movement almost every day but has noticed that he is passing ribbonlike stools. He has also had some mild left-sided abdominal discomfort which seems to be worse when his bowels are not moving. He has not noted any melena or hematochezia. His appetite has been fine however he has lost about 30 pounds over the past 6 months. His wife states they have been trying to diet some to lose weight but she feels that this is excessive given they have not been very strict with the diet .Patient had also had some recent hematospermia   and had been seen by Dr. Patsi Sears in January and treated with a 21 day course of Bactrim. He is due for followup there soon.    Review of Systems  Constitutional: Positive for unexpected weight change.  HENT: Negative.   Eyes: Negative.   Respiratory: Negative.   Cardiovascular: Negative.   Gastrointestinal: Positive for abdominal pain and constipation.  Genitourinary: Positive for hematuria.  Musculoskeletal: Negative.   Neurological: Negative.   Hematological: Negative.   Psychiatric/Behavioral: Negative.    Outpatient Encounter Prescriptions as of 10/29/2011  Medication Sig Dispense Refill  . bisacodyl (DULCOLAX) 10 MG suppository Place 10 mg rectally as needed.      . docusate sodium (DULCOLAX) 100 MG capsule Take 100 mg by mouth  2 (two) times daily. As needed.      . Garlic (GARLIQUE) 400 MG TBEC Take by mouth. Take 1 tablet delayed release once a day.      . Multiple Vitamin (MULTIVITAMIN) tablet Take 1 tablet by mouth daily.      . Omega-3 Fatty Acids (FISH OIL) 1000 MG CAPS Take by mouth. Take 1 capsule once daily.      Maxwell Caul Bicarbonate (ZEGERID) 20-1100 MG CAPS Take 1 capsule by mouth daily before breakfast. Take 1 capsule as needed once a day.      . peg 3350 powder (MOVIPREP) 100 G SOLR Take 1 kit (100 g total) by mouth once.  1 kit  0    Allergies  Allergen Reactions  . Codeine   . Hydrocodone   . Omeprazole   . Penicillins       Patient Active Problem List  Diagnoses  . ALLERGIC RHINITIS  . BRONCHITIS  . SLEEP APNEA  . SHORTNESS OF BREATH  . CHEST PAIN  . H/O adenomatous polyp of colon    Objective:   Physical Exam that his wife. Blood pressure 104/62 pulse 60 weight 227. HEENT; nontraumatic, normocephalic, EOM,I PERRLA sclera anicteric,Neck; Supple no JVD, Cardiovascular; regular rate and rhythm with S1-S2 no murmur gallop, Pulmonary; clear bilaterally, Abdomen; soft bowel sounds are active he Has  mild tenderness in the left mid quadrant, left lower quadrant, there is no guarding no  rebound, no palpable mass or hepatosplenomegaly, Rectal; not done, Extremities; no clubbing cyanosis or edema, Psych; mood and affect normal and appropriate.        Assessment & Plan:  #40 66 year old male with history of adenomatous colon polyps last colonoscopy January 2009, now presenting with an abrupt change in his bowel habits over the past 2 months with onset of significant constipation and mild left-sided abdominal discomfort. This is in the setting of a semi-unintentional weight loss of 30 pounds over the past 6 months. Etiology of his constipation is not clear however need to rule out an occult colon lesion. #2 history of sleep apnea patient uses CPAP at night #3 recent history of  hematospermia urologic workup in progress  Plan; scheduled for colonoscopy with Dr. Lina Sar, procedure discussed in detail with the patient and his wife and they are anxious to proceed. Continue MiraLax on a daily basis If colonoscopy is unrevealing he will need CT scan of the abdomen and pelvis for further evaluation

## 2011-10-29 NOTE — Patient Instructions (Signed)
Please go to the basement level to have your labs drawn.   We have scheduled the Colonoscopy with Dr. Lina Sar. Directions and brochure provided.                                                                   COLONOSCOPY A colonoscopy is an exam to evaluate your entire colon. In this exam, your colon is cleansed. A long fiberoptic tube is inserted through your rectum and into your colon. The fiberoptic scope (endoscope) is a long bundle of enclosed and very flexible fibers. These fibers transmit light to the area examined and send images from that area to your caregiver. Discomfort is usually minimal. You may be given a drug to help you sleep (sedative) during or prior to the procedure. This exam helps to detect lumps (tumors), polyps, inflammation, and areas of bleeding. Your caregiver may also take a small piece of tissue (biopsy) that will be examined under a microscope. LET YOUR CAREGIVER KNOW ABOUT:   Allergies to food or medicine.   Medicines taken, including vitamins, herbs, eyedrops, over-the-counter medicines, and creams.   Use of steroids (by mouth or creams).   Previous problems with anesthetics or numbing medicines.   History of bleeding problems or blood clots.   Previous surgery.   Other health problems, including diabetes and kidney problems.   Possibility of pregnancy, if this applies.  BEFORE THE PROCEDURE   A clear liquid diet may be required for 2 days before the exam.   Ask your caregiver about changing or stopping your regular medications.   Liquid injections (enemas) or laxatives may be required.   A large amount of electrolyte solution may be given to you to drink over a short period of time. This solution is used to clean out your colon.   You should be present 60 minutes prior to your procedure or as directed by your caregiver.  AFTER THE PROCEDURE   If you received a sedative or pain relieving medication, you will need to arrange for someone to  drive you home.   Occasionally, there is a little blood passed with the first bowel movement. Do not be concerned.  FINDING OUT THE RESULTS OF YOUR TEST Not all test results are available during your visit. If your test results are not back during the visit, make an appointment with your caregiver to find out the results. Do not assume everything is normal if you have not heard from your caregiver or the medical facility. It is important for you to follow up on all of your test results. HOME CARE INSTRUCTIONS   It is not unusual to pass moderate amounts of gas and experience mild abdominal cramping following the procedure. This is due to air being used to inflate your colon during the exam. Walking or a warm pack on your belly (abdomen) may help.   You may resume all normal meals and activities after sedatives and medicines have worn off.   Only take over-the-counter or prescription medicines for pain, discomfort, or fever as directed by your caregiver. Do not use aspirin or blood thinners if a biopsy was taken. Consult your caregiver for medicine usage if biopsies were taken.  SEEK IMMEDIATE MEDICAL CARE IF:   You have a fever.  You pass large blood clots or fill a toilet with blood following the procedure. This may also occur 10 to 14 days following the procedure. This is more likely if a biopsy was taken.   You develop abdominal pain that keeps getting worse and cannot be relieved with medicine.  Document Released: 08/22/2000 Document Revised: 05/07/2011 Document Reviewed: 04/06/2008 [facility] Patient Information 2012 [place], [place].

## 2011-10-29 NOTE — Progress Notes (Signed)
Reviewed and agree.

## 2011-11-05 ENCOUNTER — Encounter: Payer: Self-pay | Admitting: Internal Medicine

## 2011-11-05 ENCOUNTER — Ambulatory Visit (AMBULATORY_SURGERY_CENTER): Payer: Medicare Other | Admitting: Internal Medicine

## 2011-11-05 ENCOUNTER — Other Ambulatory Visit (INDEPENDENT_AMBULATORY_CARE_PROVIDER_SITE_OTHER): Payer: Medicare Other

## 2011-11-05 ENCOUNTER — Other Ambulatory Visit: Payer: Self-pay

## 2011-11-05 VITALS — BP 138/74 | HR 46 | Temp 97.4°F | Resp 20 | Ht 70.0 in | Wt 227.0 lb

## 2011-11-05 DIAGNOSIS — R194 Change in bowel habit: Secondary | ICD-10-CM

## 2011-11-05 DIAGNOSIS — R198 Other specified symptoms and signs involving the digestive system and abdomen: Secondary | ICD-10-CM

## 2011-11-05 DIAGNOSIS — Z86010 Personal history of colonic polyps: Secondary | ICD-10-CM

## 2011-11-05 DIAGNOSIS — D126 Benign neoplasm of colon, unspecified: Secondary | ICD-10-CM

## 2011-11-05 DIAGNOSIS — Z1211 Encounter for screening for malignant neoplasm of colon: Secondary | ICD-10-CM

## 2011-11-05 DIAGNOSIS — R634 Abnormal weight loss: Secondary | ICD-10-CM

## 2011-11-05 DIAGNOSIS — K6389 Other specified diseases of intestine: Secondary | ICD-10-CM

## 2011-11-05 LAB — COMPREHENSIVE METABOLIC PANEL
ALT: 19 U/L (ref 0–53)
AST: 17 U/L (ref 0–37)
Albumin: 3.8 g/dL (ref 3.5–5.2)
Alkaline Phosphatase: 57 U/L (ref 39–117)
BUN: 11 mg/dL (ref 6–23)
CO2: 29 mEq/L (ref 19–32)
Calcium: 8.2 mg/dL — ABNORMAL LOW (ref 8.4–10.5)
Chloride: 106 mEq/L (ref 96–112)
Creatinine, Ser: 1 mg/dL (ref 0.4–1.5)
GFR: 82.54 mL/min (ref >60.00)
Glucose, Bld: 81 mg/dL (ref 70–99)
Potassium: 4.3 mEq/L (ref 3.5–5.1)
Sodium: 141 mEq/L (ref 135–145)
Total Bilirubin: 0.5 mg/dL (ref 0.3–1.2)
Total Protein: 6.6 g/dL (ref 6.0–8.3)

## 2011-11-05 LAB — CBC WITH DIFFERENTIAL/PLATELET
Basophils Absolute: 0 10*3/uL (ref 0.0–0.1)
Basophils Relative: 0.3 % (ref 0.0–3.0)
Eosinophils Absolute: 0.3 10*3/uL (ref 0.0–0.7)
Eosinophils Relative: 3.4 % (ref 0.0–5.0)
HCT: 42.8 % (ref 39.0–52.0)
Hemoglobin: 14.3 g/dL (ref 13.0–17.0)
Lymphocytes Relative: 24.7 % (ref 12.0–46.0)
Lymphs Abs: 2 10*3/uL (ref 0.7–4.0)
MCHC: 33.5 g/dL (ref 30.0–36.0)
MCV: 90.4 fl (ref 78.0–100.0)
Monocytes Absolute: 0.4 10*3/uL (ref 0.1–1.0)
Monocytes Relative: 5.3 % (ref 3.0–12.0)
Neutro Abs: 5.3 10*3/uL (ref 1.4–7.7)
Neutrophils Relative %: 66.3 % (ref 43.0–77.0)
Platelets: 173 10*3/uL (ref 150.0–400.0)
RBC: 4.73 Mil/uL (ref 4.22–5.81)
RDW: 13.4 % (ref 11.5–14.6)
WBC: 7.9 10*3/uL (ref 4.5–10.5)

## 2011-11-05 LAB — SEDIMENTATION RATE: Sed Rate: 6 mm/hr (ref 0–22)

## 2011-11-05 MED ORDER — SODIUM CHLORIDE 0.9 % IV SOLN: 500.0000 mL | INTRAVENOUS | Status: DC

## 2011-11-05 NOTE — Progress Notes (Signed)
Darcey Nora, RN FROM GI IN TO SEE PATIENT WITH INSTRUCTIONS FOR ABD CT SCAN. NO FLUIDS GIVEN PRIOR TO LABWORK.

## 2011-11-05 NOTE — Patient Instructions (Signed)
YOU HAD AN ENDOSCOPIC PROCEDURE TODAY AT THE [facility]: Refer to the procedure report that was given to you for any specific questions about what was found during the examination.  If the procedure report does not answer your questions, please call your gastroenterologist to clarify.  If you requested that your care partner not be given the details of your procedure findings, then the procedure report has been included in a sealed envelope for you to review at your convenience later.  YOU SHOULD EXPECT: Some feelings of bloating in the abdomen. Passage of more gas than usual.  Walking can help get rid of the air that was put into your GI tract during the procedure and reduce the bloating. If you had a lower endoscopy (such as a colonoscopy or flexible sigmoidoscopy) you may notice spotting of blood in your stool or on the toilet paper. If you underwent a bowel prep for your procedure, then you may not have a normal bowel movement for a few days.  DIET: Your first meal following the procedure should be a light meal and then it is ok to progress to your normal diet.  A half-sandwich or bowl of soup is an example of a good first meal.  Heavy or fried foods are harder to digest and may make you feel nauseous or bloated.  Likewise meals heavy in dairy and vegetables can cause extra gas to form and this can also increase the bloating.  Drink plenty of fluids but you should avoid alcoholic beverages for 24 hours.  ACTIVITY: Your care partner should take you home directly after the procedure.  You should plan to take it easy, moving slowly for the rest of the day.  You can resume normal activity the day after the procedure however you should NOT DRIVE or use heavy machinery for 24 hours (because of the sedation medicines used during the test).    SYMPTOMS TO REPORT IMMEDIATELY: A gastroenterologist can be reached at any hour.  During normal business hours, 8:30 AM to 5:00 PM Monday through Friday,  call [phone].  After hours and on weekends, please call the GI answering service at [phone] who will take a message and have the physician on call contact you.   Following lower endoscopy (colonoscopy or flexible sigmoidoscopy):  Excessive amounts of blood in the stool  Significant tenderness or worsening of abdominal pains  Swelling of the abdomen that is new, acute  Fever of 100F or higher  Following upper endoscopy (EGD)  Vomiting of blood or coffee ground material  New chest pain or pain under the shoulder blades  Painful or persistently difficult swallowing  New shortness of breath  Fever of 100F or higher  Black, tarry-looking stools  FOLLOW UP: If any biopsies were taken you will be contacted by phone or by letter within the next 1-3 weeks.  Call your gastroenterologist if you have not heard about the biopsies in 3 weeks.  Our staff will call the home number listed on your records the next business day following your procedure to check on you and address any questions or concerns that you may have at that time regarding the information given to you following your procedure. This is a courtesy call and so if there is no answer at the home number and we have not heard from you through the emergency physician on call, we will assume that you have returned to your regular daily activities without incident.  SIGNATURES/CONFIDENTIALITY: You and/or your care   partner have signed paperwork which will be entered into your electronic medical record.  These signatures attest to the fact that that the information above on your After Visit Summary has been reviewed and is understood.  Full responsibility of the confidentiality of this discharge information lies with you and/or your care-partner.  

## 2011-11-05 NOTE — Progress Notes (Signed)
Patient did not have preoperative order for IV antibiotic SSI prophylaxis. (G8918)  Patient did not experience any of the following events: a burn prior to discharge; a fall within the facility; wrong site/side/patient/procedure/implant event; or a hospital transfer or hospital admission upon discharge from the facility. (G8907)  

## 2011-11-05 NOTE — Progress Notes (Signed)
Patient and [facility] recovery aware of the need for labs today and CT scan scheduled for 11/11/11 1:30

## 2011-11-05 NOTE — Op Note (Signed)
[facility] [address] [place], [place]  [address]  COLONOSCOPY PROCEDURE REPORT  PATIENT:  Joseph Mcdaniel, Joseph Mcdaniel  MR#:  [id number] BIRTHDATE:  1946/02/07, 66 yrs. old  GENDER:  male ENDOSCOPIST:  Hedwig Morton. Juanda Chance, MD REF. BY:  Vernie Ammons, M.D. 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 Digital rectal exam was performed and revealed no rectal masses. The [id number] endoscope was introduced through the anus and advanced to the cecum, which was identified by both the appendix and ileocecal valve.  The quality of the prep was good, using MoviPrep.  The instrument was then slowly withdrawn as the colon was fully examined. <<PROCEDUREIMAGES>>  FINDINGS:  A lipoma was found. at 30 cm, 20 mm submucosal nodule c/w lipoma With standard forceps, biopsy was obtained and sent to pathology (see image6 and image7).  Two polyps were found. 1-2 mm polyps in the rectum The polyps were removed using cold biopsy forceps (see image9 and image8).  This was otherwise a normal examination of the colon (see image1, image2, image3, image4, and image5).   Retroflexed views in the rectum revealed no abnormalities.    The scope was then withdrawn from the patient and the procedure completed.  COMPLICATIONS:  None ENDOSCOPIC IMPRESSION: 1) Lipoma 2) Two polyps 3) Otherwise normal examination RECOMMENDATIONS: 1) Await pathology results 2) High fiber diet.  REPEAT EXAM:  In 5 - 7 year(s) for.  ______________________________ Hedwig Morton. Juanda Chance, MD  CC:  n. eSIGNED:   Hedwig Morton. Juanita Devincent at 11/05/2011 12:48 PM  Blane Ohara, [phone]

## 2011-11-06 ENCOUNTER — Telehealth: Payer: Self-pay | Admitting: *Deleted

## 2011-11-06 NOTE — Telephone Encounter (Signed)
Left message on answering machine to call for questions or concerns.

## 2011-11-11 ENCOUNTER — Ambulatory Visit (INDEPENDENT_AMBULATORY_CARE_PROVIDER_SITE_OTHER)
Admission: RE | Admit: 2011-11-11 | Discharge: 2011-11-11 | Disposition: A | Discharge status: Home / Self Care | Payer: Medicare Other | Source: Ambulatory Visit | Attending: Internal Medicine | Admitting: Internal Medicine

## 2011-11-11 DIAGNOSIS — R194 Change in bowel habit: Secondary | ICD-10-CM

## 2011-11-11 DIAGNOSIS — R198 Other specified symptoms and signs involving the digestive system and abdomen: Secondary | ICD-10-CM

## 2011-11-11 DIAGNOSIS — R634 Abnormal weight loss: Secondary | ICD-10-CM

## 2011-11-11 MED ORDER — IOHEXOL 300 MG/ML  SOLN
100.0000 mL | Freq: Once | INTRAMUSCULAR | Status: AC | PRN
  Administered 2011-11-11: 100 mL via INTRAVENOUS

## 2011-11-12 ENCOUNTER — Telehealth: Payer: Self-pay | Admitting: Internal Medicine

## 2011-11-12 ENCOUNTER — Encounter: Payer: Self-pay | Admitting: Internal Medicine

## 2011-11-12 NOTE — Telephone Encounter (Signed)
Spoke with patient's wife and gave her results and recommendations. She does not wish to set up a nutrition consult. Per her request, sent copy of CT to Dr. Patsi Sears and Dr. Ronne Binning.

## 2011-11-12 NOTE — Telephone Encounter (Signed)
Spoke with patient's wife. She states they are anxious about CT results. Can be reached at [phone] when results are available.

## 2011-11-12 NOTE — Telephone Encounter (Signed)
Please call pt with results of CT scan - no active disease, small hemangioma in the liver was present before and has not changed.weight loss unexplained, it was initially intentional. Please offer for him to meet with the nutritionist to assess his caloric needs to maintain weight.

## 2012-08-08 DIAGNOSIS — I4891 Unspecified atrial fibrillation: Secondary | ICD-10-CM

## 2012-08-08 HISTORY — DX: Unspecified atrial fibrillation: I48.91

## 2012-08-30 ENCOUNTER — Emergency Department (HOSPITAL_COMMUNITY): Payer: Medicare Other

## 2012-08-30 ENCOUNTER — Observation Stay (HOSPITAL_COMMUNITY)
Admission: EM | Admit: 2012-08-30 | Discharge: 2012-08-31 | DRG: 310 | Disposition: A | Discharge status: Home / Self Care | Payer: Medicare Other | Attending: Internal Medicine | Admitting: Internal Medicine

## 2012-08-30 ENCOUNTER — Encounter (HOSPITAL_COMMUNITY): Payer: Self-pay | Admitting: *Deleted

## 2012-08-30 DIAGNOSIS — K219 Gastro-esophageal reflux disease without esophagitis: Secondary | ICD-10-CM | POA: Insufficient documentation

## 2012-08-30 DIAGNOSIS — I4891 Unspecified atrial fibrillation: Principal | ICD-10-CM

## 2012-08-30 DIAGNOSIS — G4733 Obstructive sleep apnea (adult) (pediatric): Secondary | ICD-10-CM

## 2012-08-30 DIAGNOSIS — R079 Chest pain, unspecified: Secondary | ICD-10-CM

## 2012-08-30 DIAGNOSIS — J449 Chronic obstructive pulmonary disease, unspecified: Secondary | ICD-10-CM

## 2012-08-30 DIAGNOSIS — Z9989 Dependence on other enabling machines and devices: Secondary | ICD-10-CM

## 2012-08-30 DIAGNOSIS — R0789 Other chest pain: Secondary | ICD-10-CM | POA: Insufficient documentation

## 2012-08-30 DIAGNOSIS — F1220 Cannabis dependence, uncomplicated: Secondary | ICD-10-CM | POA: Insufficient documentation

## 2012-08-30 HISTORY — DX: Fibromyalgia: M79.7

## 2012-08-30 HISTORY — DX: Unspecified atrial fibrillation: I48.91

## 2012-08-30 LAB — CBC WITH DIFFERENTIAL/PLATELET
Basophils Absolute: 0 10*3/uL (ref 0.0–0.1)
Basophils Relative: 0 % (ref 0–1)
Eosinophils Absolute: 0.4 10*3/uL (ref 0.0–0.7)
MCH: 30.9 pg (ref 26.0–34.0)
MCHC: 34.4 g/dL (ref 30.0–36.0)
Neutro Abs: 6.3 10*3/uL (ref 1.7–7.7)
Neutrophils Relative %: 60 % (ref 43–77)
Platelets: 190 10*3/uL (ref 150–400)
RDW: 12.9 % (ref 11.5–15.5)

## 2012-08-30 LAB — URINALYSIS, ROUTINE W REFLEX MICROSCOPIC
Ketones, ur: NEGATIVE mg/dL
Leukocytes, UA: NEGATIVE
Protein, ur: NEGATIVE mg/dL
Urobilinogen, UA: 1 mg/dL (ref 0.0–1.0)

## 2012-08-30 LAB — MAGNESIUM: Magnesium: 2.1 mg/dL (ref 1.5–2.5)

## 2012-08-30 LAB — BASIC METABOLIC PANEL
BUN: 16 mg/dL (ref 6–23)
Calcium: 8.9 mg/dL (ref 8.4–10.5)
Creatinine, Ser: 1.08 mg/dL (ref 0.50–1.35)
GFR calc Af Amer: 81 mL/min — ABNORMAL LOW (ref >90)
GFR calc non Af Amer: 70 mL/min — ABNORMAL LOW (ref >90)
Potassium: 3.9 mEq/L (ref 3.5–5.1)

## 2012-08-30 LAB — RAPID URINE DRUG SCREEN, HOSP PERFORMED
Opiates: NOT DETECTED
Tetrahydrocannabinol: POSITIVE — AB

## 2012-08-30 LAB — D-DIMER, QUANTITATIVE: D-Dimer, Quant: 0.52 ug/mL-FEU — ABNORMAL HIGH (ref 0.00–0.48)

## 2012-08-30 MED ORDER — SODIUM CHLORIDE 0.9 % IV BOLUS (SEPSIS)
1000.0000 mL | Freq: Once | INTRAVENOUS | Status: AC
  Administered 2012-08-30: 1000 mL via INTRAVENOUS

## 2012-08-30 MED ORDER — ASPIRIN 81 MG PO CHEW
162.0000 mg | CHEWABLE_TABLET | Freq: Once | ORAL | Status: AC
  Administered 2012-08-30: 162 mg via ORAL
  Filled 2012-08-30: qty 2

## 2012-08-30 MED ORDER — IOHEXOL 350 MG/ML SOLN
100.0000 mL | Freq: Once | INTRAVENOUS | Status: AC | PRN
  Administered 2012-08-30: 100 mL via INTRAVENOUS

## 2012-08-30 NOTE — ED Provider Notes (Addendum)
History     CSN: [id number]  Arrival date & time 08/30/12  2043   First MD Initiated Contact with Patient 08/30/12 2105      Chief Complaint  Patient presents with  . Chest Pain  . Irregular Heart Beat    (Consider location/radiation/quality/duration/timing/severity/associated sxs/prior treatment) HPI Comments: 66 y/o male comes in with cc of chest pain and palpitations. Pt has no medical hx, does have family hx of CAD. States that he started having palpitations around 5 pm, unprovoked. Associated with the palpitations he had some chest pain, midsternal, and back pain. He has no sob, dixxiness, diophoresis. No hx of HTN, no hx of MS disorders and no hx of illicit use. The pain was constant pressure like pain, with no aggravating or relieving factors. No hx of PE, DV, and no risk factors for the same.   Patient is a 66 y.o. male presenting with chest pain. The history is provided by the patient and the spouse.  Chest Pain Primary symptoms include shortness of breath and dizziness. Pertinent negatives for primary symptoms include no fever, no cough and no palpitations.     Past Medical History  Diagnosis Date  . GERD (gastroesophageal reflux disease)   . ED (erectile dysfunction)   . Asthma   . Anxiety   . Vitamin D deficiency   . OSA (obstructive sleep apnea)     Uses CPAP machine  . Generalized OA   . DDD (degenerative disc disease)   . Myalgia and myositis   . Colon polyp     Past Surgical History  Procedure Date  . Rotator cuff tear and repair     Right side  . Lumbar laminectomy   . Lipoma from back     Family History  Problem Relation Age of Onset  . Breast cancer Mother     Metastatic  . Diabetes Maternal Aunt   . Heart attack Father     Died at age 31  . Colon cancer Neg Hx     History  Substance Use Topics  . Smoking status: Former Games developer  . Smokeless tobacco: Never Used     Comment: Quit smoking 1996  . Alcohol Use: 1.8 oz/week    3 Cans of  beer per week     Comment: occ beer      Review of Systems  Constitutional: Negative for fever, chills and activity change.  HENT: Negative for neck pain.   Eyes: Negative for visual disturbance.  Respiratory: Positive for chest tightness and shortness of breath. Negative for cough.   Cardiovascular: Positive for chest pain. Negative for palpitations and leg swelling.  Gastrointestinal: Negative for abdominal distention.  Genitourinary: Negative for dysuria, enuresis and difficulty urinating.  Musculoskeletal: Negative for arthralgias.  Neurological: Positive for dizziness. Negative for light-headedness and headaches.  Psychiatric/Behavioral: Negative for confusion.    Allergies  Codeine; Hydrocodone; Omeprazole; and Penicillins  Home Medications   Current Outpatient Rx  Name  Route  Sig  Dispense  Refill  . TADALAFIL 5 MG PO TABS   Oral   Take 5 mg by mouth daily as needed. Erectile dysfunction           BP 103/69  Pulse 63  Resp 19  SpO2 98%  Physical Exam  Nursing note and vitals reviewed. Constitutional: He is oriented to person, place, and time. He appears well-developed.  HENT:  Head: Normocephalic and atraumatic.  Eyes: Conjunctivae normal and EOM are normal. Pupils are equal, round, and reactive  to light.  Neck: Normal range of motion. Neck supple. No JVD present.  Cardiovascular: Normal heart sounds.   No murmur heard.      tachycardia  Pulmonary/Chest: Effort normal and breath sounds normal. No respiratory distress. He has no wheezes.  Abdominal: Soft. Bowel sounds are normal. He exhibits no distension. There is no tenderness. There is no rebound and no guarding.  Neurological: He is alert and oriented to person, place, and time.  Skin: Skin is warm.    ED Course  Procedures (including critical care time)   Labs Reviewed  CBC WITH DIFFERENTIAL  BASIC METABOLIC PANEL  TROPONIN I  URINALYSIS, ROUTINE W REFLEX MICROSCOPIC  URINE RAPID DRUG SCREEN  (HOSP PERFORMED)  MAGNESIUM  D-DIMER, QUANTITATIVE   No results found.   No diagnosis found.    MDM   Date: 08/30/2012  Rate: 150  Rhythm: atrial fibrillation  QRS Axis: left  Intervals: normal  ST/T Wave abnormalities: nonspecific ST/T changes  Conduction Disutrbances:none  Narrative Interpretation:   Old EKG Reviewed: changes noted New afib, rsr prime v2  Pt comes in with cc of palpitations, chest pain, back pain. Pt noted to be in afib with rvr, no hx of same.  Pt's CHAD2 score is 0, we will give ASA. Pt has no striking etiology for afib on hx or exam though. We will get basic workup. Given the rsr primes, we will get dimer. Pt has chest pain, with radiation to the back, mild at my evaluation - dissection in the ddx. Bilateral BP are normal, and there is no other complain besides the pain. Dimer has shown sensitivity to dissection as well - so it will act as a screening test for PE and Dissection. CXR ordered, hydration started.  10:05 PM Pt went into sinus rhythm spontaneously. We will continue with the current workup though. Also of note - pt had a stress 5 yrs ago - and it was normal.     Derwood Kaplan, MD 08/30/12 2206   Date: 08/30/2012  Rate: 69  Rhythm: normal sinus rhythm  QRS Axis: left  Intervals: normal  ST/T Wave abnormalities: nonspecific ST/T changes  Conduction Disutrbances:none  Narrative Interpretation:   Old EKG Reviewed: changes noted    Derwood Kaplan, MD 08/30/12 2207  11:46 PM CT PE negative. Will admit for chest pain r/o.  CRITICAL CARE Performed by: Derwood Kaplan   Total critical care time: 45 minutes  Critical care time was exclusive of separately billable procedures and treating other patients.  Critical care was necessary to treat or prevent imminent or life-threatening deterioration.  Critical care was time spent personally by me on the following activities: development of treatment plan with patient and/or  surrogate as well as nursing, discussions with consultants, evaluation of patient's response to treatment, examination of patient, obtaining history from patient or surrogate, ordering and performing treatments and interventions, ordering and review of laboratory studies, ordering and review of radiographic studies, pulse oximetry and re-evaluation of patient's condition.   Derwood Kaplan, MD 08/30/12 [phone]

## 2012-08-30 NOTE — ED Notes (Signed)
EKG completed in triage.

## 2012-08-30 NOTE — ED Notes (Signed)
Pt c/o chest pain that radiates to back. Reports pain started last night. Pain is described as dull. Pt with irregular heart rate in triage.

## 2012-08-31 ENCOUNTER — Encounter (HOSPITAL_COMMUNITY): Payer: Self-pay | Admitting: Physician Assistant

## 2012-08-31 DIAGNOSIS — R079 Chest pain, unspecified: Secondary | ICD-10-CM

## 2012-08-31 DIAGNOSIS — J449 Chronic obstructive pulmonary disease, unspecified: Secondary | ICD-10-CM

## 2012-08-31 DIAGNOSIS — I4891 Unspecified atrial fibrillation: Secondary | ICD-10-CM

## 2012-08-31 DIAGNOSIS — Z9989 Dependence on other enabling machines and devices: Secondary | ICD-10-CM

## 2012-08-31 DIAGNOSIS — F1220 Cannabis dependence, uncomplicated: Secondary | ICD-10-CM

## 2012-08-31 LAB — TROPONIN I
Troponin I: <0.3 ng/mL (ref <0.30)
Troponin I: <0.3 ng/mL (ref <0.30)

## 2012-08-31 LAB — BASIC METABOLIC PANEL
CO2: 28 mEq/L (ref 19–32)
Chloride: 106 mEq/L (ref 96–112)
GFR calc Af Amer: 80 mL/min — ABNORMAL LOW (ref >90)
Potassium: 3.7 mEq/L (ref 3.5–5.1)

## 2012-08-31 LAB — CBC
HCT: 42 % (ref 39.0–52.0)
Hemoglobin: 13.9 g/dL (ref 13.0–17.0)
MCV: 90.3 fL (ref 78.0–100.0)
WBC: 8.1 10*3/uL (ref 4.0–10.5)

## 2012-08-31 LAB — TSH: TSH: 3.49 u[IU]/mL (ref 0.350–4.500)

## 2012-08-31 LAB — T4, FREE: Free T4: 1.21 ng/dL (ref 0.80–1.80)

## 2012-08-31 MED ORDER — ASPIRIN 325 MG PO TBEC: 325.0000 mg | DELAYED_RELEASE_TABLET | Freq: Every day | ORAL | Status: DC

## 2012-08-31 MED ORDER — ONDANSETRON HCL 4 MG/2ML IJ SOLN: 4.0000 mg | Freq: Four times a day (QID) | INTRAMUSCULAR | Status: DC | PRN

## 2012-08-31 MED ORDER — ACETAMINOPHEN 650 MG RE SUPP: 650.0000 mg | Freq: Four times a day (QID) | RECTAL | Status: DC | PRN

## 2012-08-31 MED ORDER — ASPIRIN EC 325 MG PO TBEC
325.0000 mg | DELAYED_RELEASE_TABLET | Freq: Every day | ORAL | Status: DC
  Administered 2012-08-31: 325 mg via ORAL
  Filled 2012-08-31: qty 1

## 2012-08-31 MED ORDER — ENOXAPARIN SODIUM 40 MG/0.4ML ~~LOC~~ SOLN
40.0000 mg | SUBCUTANEOUS | Status: DC
  Administered 2012-08-31: 40 mg via SUBCUTANEOUS
  Filled 2012-08-31: qty 0.4

## 2012-08-31 MED ORDER — ONDANSETRON HCL 4 MG PO TABS: 4.0000 mg | ORAL_TABLET | Freq: Four times a day (QID) | ORAL | Status: DC | PRN

## 2012-08-31 MED ORDER — DILTIAZEM HCL 30 MG PO TABS: 30.0000 mg | ORAL_TABLET | Freq: Every day | ORAL | Status: DC | PRN

## 2012-08-31 MED ORDER — ACETAMINOPHEN 325 MG PO TABS: 650.0000 mg | ORAL_TABLET | Freq: Four times a day (QID) | ORAL | Status: DC | PRN

## 2012-08-31 NOTE — Consult Note (Signed)
CARDIOLOGY CONSULT NOTE  Patient ID: Joseph Mcdaniel, MRN: [id number], DOB/AGE: [birth year] 66 y.o. Admit date: 08/30/2012   Date of Consult: 08/31/2012 Primary Physician: Dorcas Carrow, MD Primary Cardiologist: saw Dr. Antoine Poche remotely for eval  Chief Complaint: chest pressure, palpitations Reason for Consult: atrial fib with RVR  HPI: Joseph Mcdaniel is a 66 y/o M with history of normal coronaries by cardiac CT 2011, 30 yrs former tobacco, GERD, OSA, obesity, and fibromyalgia who presented to [facility] complaining of palpitations. Yesterday around 5pm while watching TV he developed tachypalpitations associated with chest pressure and mild radiation to back. No diaphoresis, nausea, vomiting, lightheadedness or SOB. He came to the ER  where he was found to be in afib RVR with rates in the 150's. D-dimer was borderline at 0.52; CT angio showed no evidence of PE or acute CP disease. IV hydration was started and the patient spontaneously converted to NSR/sinus bradycardia. He believes at that point he began to feel better. He has maintained NSR overnight and currently feels "great." He does a fair amount of gardening including hoeing without any exertional CP or SOB. He isn't sure but thinks he may have had an episode in the past but only lasting less than a minute (his wife recounts a time when he asked her to check his BP/HR because of feeling anxious, but by the time she did, his vitals were normal). No other recent confounding symptoms. Labs: troponin neg x 3, glu borderline at 104-113. UDS + for THC.  Past Medical History  Diagnosis Date  . GERD (gastroesophageal reflux disease)   . ED (erectile dysfunction)   . Anxiety   . Vitamin D deficiency   . OSA (obstructive sleep apnea)     Uses CPAP machine  . Generalized OA   . DDD (degenerative disc disease)   . Myalgia and myositis   . Colon polyp   . Fibromyalgia   No history of DM, stroke, ministroke, HTN or CHF.    Most Recent Cardiac  Studies: Coronary CT 2011 IMPRESSION: 1. Normal coronaries with no evidence for stenosis or plaque. Right dominant system. 2. Coronary calcium score of 0, which is low risk. 3. Non-cardiac findings will be reported by [facility] Radiology    Surgical History:  Past Surgical History  Procedure Date  . Rotator cuff tear and repair     Right side  . Lumbar laminectomy   . Lipoma from back      Home Meds: Prior to Admission medications   Medication Sig Start Date End Date Taking? Authorizing Provider  tadalafil (CIALIS) 5 MG tablet Take 5 mg by mouth daily as needed. Erectile dysfunction   Yes Historical Provider, MD    Inpatient Medications:    . aspirin EC  325 mg Oral Daily  . enoxaparin (LOVENOX) injection  40 mg Subcutaneous Q24H    Allergies:  Allergies  Allergen Reactions  . Codeine   . Hydrocodone   . Omeprazole   . Penicillins     History   Social History  . Marital Status: Married    Spouse Name: N/A    Number of Children: 4  . Years of Education: N/A   Occupational History  . Retired     PT @ [employer]   Social History Main Topics  . Smoking status: Former Games developer  . Smokeless tobacco: Never Used     Comment: Quit smoking 1996, smoked for 30 years  . Alcohol Use: 1.8 oz/week    3 Cans of  beer per week     Comment: occ beer on weekends  . Drug Use: No  . Sexually Active: Not on file   Other Topics Concern  . Not on file   Social History Narrative   Patient disabled due to multiple orthopedic problems.     Family History  Problem Relation Age of Onset  . Breast cancer Mother     Metastatic  . Diabetes Maternal Aunt   . Heart attack Father     Died at age 74  . Colon cancer Neg Hx      Review of Systems: General: negative for chills, fever, night sweats or weight changes.  Cardiovascular: negative for edema, orthopnea, paroxysmal nocturnal dyspnea, shortness of breath or dyspnea on exertion Dermatological: negative for rash Respiratory:  negative for cough or wheezing Urologic: negative for hematuria Abdominal: negative for nausea, vomiting, diarrhea, bright red blood per rectum, melena, or hematemesis Neurologic: negative for visual changes, syncope, or dizziness All other systems reviewed and are otherwise negative except as noted above.  Labs:  [facility] 08/31/12 0650 08/31/12 0045 08/30/12 2105  CKTOTAL -- -- --  CKMB -- -- --  TROPONINI <0.30 <0.30 <0.30   Lab Results  Component Value Date   WBC 8.1 08/31/2012   HGB 13.9 08/31/2012   HCT 42.0 08/31/2012   MCV 90.3 08/31/2012   PLT 158 08/31/2012    Lab 08/31/12 0650  NA 139  K 3.7  CL 106  CO2 28  BUN 17  CREATININE 1.09  CALCIUM 8.1*  PROT --  BILITOT --  ALKPHOS --  ALT --  AST --  GLUCOSE 104*    Lab Results  Component Value Date   DDIMER 0.52* 08/30/2012   Radiology/Studies:  Ct Angio Chest Pe W/cm &/or Wo Cm 08/30/2012  *RADIOLOGY REPORT*  Clinical Data: Chest pain, palpitations  CT ANGIOGRAPHY CHEST  Technique:  Multidetector CT imaging of the chest using the standard protocol during bolus administration of intravenous contrast. Multiplanar reconstructed images including MIPs were obtained and reviewed to evaluate the vascular anatomy.  Contrast: OMNIPAQUE IOHEXOL 350 MG/ML SOLN  Comparison: Chest radiographs dated 08/30/2012.  Findings: No evidence of pulmonary embolism.  The lungs are essentially clear.  No focal consolidation.  No pleural effusion or pneumothorax.  Visualized thyroid is unremarkable.  The heart is normal in size.  No pericardial effusion. Atherosclerotic calcifications of the aortic arch.  No suspicious mediastinal, hilar, or axillary lymphadenopathy.  Visualized upper abdomen is unremarkable.  Degenerative changes of the visualized thoracolumbar spine.  IMPRESSION: No evidence of pulmonary embolism.  No evidence of acute cardiopulmonary disease.   Original Report Authenticated By: Charline Bills, M.D.    Dg Chest Port  1 View 08/30/2012  *RADIOLOGY REPORT*  Clinical Data: Chest pain; arrhythmia.  PORTABLE CHEST - 1 VIEW  Comparison: Chest radiograph performed 01/07/2005, and cardiac CT performed 01/08/2010  Findings: The lungs are well-aerated.  Chronic peribronchial thickening is noted.  There is no evidence of focal opacification, pleural effusion or pneumothorax.  The cardiomediastinal silhouette is within normal limits.  No acute osseous abnormalities are seen.  IMPRESSION: No acute cardiopulmonary process seen.  Chronic peribronchial thickening noted.   Original Report Authenticated By: Tonia Ghent, M.D.    EKG: 12/23 at 20:54: atrial fib 150bpm RSR V1, no acute ST-T changes 12/23 at 21:07: NSR 69bpm borderline IVCD with RSR V1, nonspecific TWI III 12/24 at 5:49: SB 53bpm, no acute ST-T changes  Physical Exam: Blood pressure 113/70, pulse 52,  temperature 97.3 F (36.3 C), temperature source Oral, resp. rate 18, weight 230 lb 2.6 oz (104.4 kg), SpO2 99.00%. General: Well developed, well nourished overweight WM in no acute distress. Head: Normocephalic, atraumatic, sclera non-icteric, no xanthomas, nares are without discharge.  Neck: Negative for carotid bruits. JVD not elevated. Lungs: Clear bilaterally to auscultation without wheezes, rales, or rhonchi. Breathing is unlabored. Heart: RRR with S1 S2. No murmurs, rubs, or gallops appreciated. Abdomen: Soft, non-tender, non-distended with normoactive bowel sounds. No hepatomegaly. No rebound/guarding. No obvious abdominal masses. Msk:  Strength and tone appear normal for age. Extremities: No clubbing or cyanosis. No edema.  Distal pedal pulses are 2+ and equal bilaterally. Neuro: Alert and oriented X 3. No facial asymmetry. No focal deficit. Moves all extremities spontaneously. Psych:  Responds to questions appropriately with a normal affect.   Assessment and Plan:   1. Transient afib with RVR - this is his first recognized episode. He thinks in  retrospect he might have had one other remote episode but has not had any recent symptoms otherwise. CHADS2 is currently 0, but will await echo read to r/o LV dysfunction as part of score. Will also need close following of his blood sugars as he may be pre-diabetic. Check TSH and free T4 (we will follow up on result - does not need to stay for this). After discussion with Dr. Daleen Squibb, would recommend to prescribe diltiazem (short-acting) 30mg  tablets at discharge - take 1 tablet daily PRN palpitations/racing heart, may repeat 1 tablet 30 minutes later if symptoms not improved. (BP/HR a bit soft for maintenance dosing at present.) Continue ASA. Will f/u echo results this afternoon. If not markedly abnormal, he should be OK for discharge this afternoon with followup on 09/15/11 at 10:10am with Tereso Newcomer PA-C. We will let you know.  2. Chest pressure - suspect due to #1. H/o normal coronaries with calcium score of zero in 2011. No recent exertional symptoms. Continue ASA.  3. Hyperglycemia - will need sugar and lipids followed by PCP. The patient reports eating a lot of Christmas cookies yesterday.   4. OSA - continue CPAP.  Signed, Dayna Dunn PA-C 08/31/2012, 10:07 AM   I have taken a history, reviewed medications, allergies, PMH, SH, FH, and reviewed ROS and examined the patient.  I agree with the assessment and plan. If Echo normal, will discharge home on ASA and prn diltiazem with slow HR and BP. Discussed how to react and respond if occurs again with patient and his wife.  Emalynn Clewis C. Daleen Squibb, MD, [facility] Pager:  [phone]

## 2012-08-31 NOTE — Progress Notes (Signed)
Discharge to home ambulatory, , d/c instructions  And follow up appointment done and given to the patient, verbalized understanding.D/c no complaints of any pain or discomfort.PIV removed no s/s if swelling or infiltration  noted.

## 2012-08-31 NOTE — Discharge Summary (Signed)
Physician Discharge Summary  Joseph Mcdaniel ZOX:[id number] DOB: [birth year] DOA: 08/30/2012  PCP: Dorcas Carrow, MD  Admit date: 08/30/2012 Discharge date: 08/31/2012   Recommendations for Outpatient Follow-up:  Follow up with cardiology as recommended. Discharge Diagnoses:  Active Problems:  SLEEP APNEA  Cannabis dependence, episodic use  OSA on CPAP   Discharge Condition: stable.  Diet recommendation: regular diet  Filed Weights   08/31/12 0207  Weight: 104.4 kg (230 lb 2.6 oz)    History of present illness:  66 year old male with a history of obstructive sleep apnea and presumptive COPD presented with chest discomfort that began at 5 PM on August 30, 2012. At that time, the patient actually felt some palpitations and fluttering in his chest. His wife felt his pulse and felt to be correct. He tried to take his blood pressure at home, but it not register well. As a result, EMS was activated and the patient was brought to emergency department. The patient was found to have atrial fibrillation with RVR. The patient spontaneously converted to sinus without any interventions. He also complained of chest discomfort and was admitted for further evaluation.    Hospital Course:   Atrial fibrillation with RVR : initially his Heart rate was up to 150, which has Spontaneously converted back to sinus without medications, only given IV fluids -Echocardiogram ordered showed good LVEF.  cardiology evaluation consulted for possible stress test or heart catheterization. -CHADS score=0, continue ASA and diltiazem as needed for rapid heart rate. And outpatient work up .  Chest discomfort  -Cycled troponins which were negative -Repeat EKG in the morning showed sinus Presumptive COPD  -Patient smoked 3 packs per day x35 years, quit 1996  Obstructive sleep apnea  -Patient wears CPAP at night, 12mm H2O pressure  Cannabis use  -Urine drug screen positive for cannabis  -Cessation  discussed  Procedures:  echo  Consultations:  cardiology  Discharge Exam: Filed Vitals:   08/31/12 0000 08/31/12 0207 08/31/12 0549 08/31/12 1334  BP: 108/69 120/100 113/70 119/64  Pulse: 59 60 52 52  Temp:  97.1 F (36.2 C) 97.3 F (36.3 C) 97.4 F (36.3 C)  TempSrc:  Oral Oral Oral  Resp: 17 18 18 18   Weight:  104.4 kg (230 lb 2.6 oz)    SpO2: 100% 98% 99% 98%    General: A&O x 3, NAD, nontoxic, pleasant/cooperative  Head/Eye: No conjunctival hemorrhage, no icterus, [place]/AT  ENT: No icterus, No thrush, good dentition, no pharyngeal exudate  Neck: No masses, no lymphadenpathy  CV: RRR, no rub, no gallop, no S3  Lung: CTAB, good air movement, no wheeze, no rhonchi  Abdomen: soft/NT, +BS, nondistended, no peritoneal signs  Ext: No cyanosis, No rashes, No petechiae, No lymphangitis,   Discharge Instructions  Discharge Orders    Future Appointments: Provider: Department: Dept Phone: Center:   09/14/2012 10:10 AM Beatrice Lecher, PA [facility] Main Office [place]) [phone] [id number]     Future Orders Please Complete By Expires   Diet general      Discharge instructions      Comments:   Please follow up with cardiology as recommended.  When you get an episode of rapid heart rate and palpitations, please take cardizem 30mg  as recommended by cardiology.   Activity as tolerated - No restrictions          Medication List     As of 08/31/2012  1:56 PM    TAKE these medications  aspirin 325 MG EC tablet   Take 1 tablet (325 mg total) by mouth daily.      diltiazem 30 MG tablet   Commonly known as: CARDIZEM   Take 1 tablet (30 mg total) by mouth daily as needed.      tadalafil 5 MG tablet   Commonly known as: CIALIS   Take 5 mg by mouth daily as needed. Erectile dysfunction           Follow-up Information    Follow up with Tereso Newcomer, PA. (09/15/11 at 10:10am)    Contact information:   [facility] [address] [place] [address] [phone]       Follow up with Dorcas Carrow, MD. (As needed)    Contact information:   [address] [place] [address] [phone]           The results of significant diagnostics from this hospitalization (including imaging, microbiology, ancillary and laboratory) are listed below for reference.    Significant Diagnostic Studies: Ct Angio Chest Pe W/cm &/or Wo Cm  08/30/2012  *RADIOLOGY REPORT*  Clinical Data: Chest pain, palpitations  CT ANGIOGRAPHY CHEST  Technique:  Multidetector CT imaging of the chest using the standard protocol during bolus administration of intravenous contrast. Multiplanar reconstructed images including MIPs were obtained and reviewed to evaluate the vascular anatomy.  Contrast: OMNIPAQUE IOHEXOL 350 MG/ML SOLN  Comparison: Chest radiographs dated 08/30/2012.  Findings: No evidence of pulmonary embolism.  The lungs are essentially clear.  No focal consolidation.  No pleural effusion or pneumothorax.  Visualized thyroid is unremarkable.  The heart is normal in size.  No pericardial effusion. Atherosclerotic calcifications of the aortic arch.  No suspicious mediastinal, hilar, or axillary lymphadenopathy.  Visualized upper abdomen is unremarkable.  Degenerative changes of the visualized thoracolumbar spine.  IMPRESSION: No evidence of pulmonary embolism.  No evidence of acute cardiopulmonary disease.   Original Report Authenticated By: Charline Bills, M.D.    Dg Chest Port 1 View  08/30/2012  *RADIOLOGY REPORT*  Clinical Data: Chest pain; arrhythmia.  PORTABLE CHEST - 1 VIEW  Comparison: Chest radiograph performed 01/07/2005, and cardiac CT performed 01/08/2010  Findings: The lungs are well-aerated.  Chronic peribronchial thickening is noted.  There is no evidence of focal opacification, pleural effusion or pneumothorax.  The cardiomediastinal silhouette is within normal limits.  No acute osseous abnormalities are seen.   IMPRESSION: No acute cardiopulmonary process seen.  Chronic peribronchial thickening noted.   Original Report Authenticated By: Tonia Ghent, M.D.     Microbiology: No results found for this or any previous visit (from the past 240 hour(s)).   Labs: Basic Metabolic Panel:  Lab 08/31/12 4010 08/30/12 2105  NA 139 139  K 3.7 3.9  CL 106 103  CO2 28 27  GLUCOSE 104* 113*  BUN 17 16  CREATININE 1.09 1.08  CALCIUM 8.1* 8.9  MG 2.0 2.1  PHOS -- --   Liver Function Tests: No results found for this basename: AST:5,ALT:5,ALKPHOS:5,BILITOT:5,PROT:5,ALBUMIN:5 in the last 168 hours No results found for this basename: LIPASE:5,AMYLASE:5 in the last 168 hours No results found for this basename: AMMONIA:5 in the last 168 hours CBC:  Lab 08/31/12 0650 08/30/12 2105  WBC 8.1 10.5  NEUTROABS -- 6.3  HGB 13.9 15.9  HCT 42.0 46.2  MCV 90.3 89.9  PLT 158 190   Cardiac Enzymes:  Lab 08/31/12 0650 08/31/12 0045 08/30/12 2105  CKTOTAL -- -- --  CKMB -- -- --  CKMBINDEX -- -- --  TROPONINI <0.30 <0.30 <0.30   BNP: BNP (last 3 results) No results found for this basename: PROBNP:3 in the last 8760 hours CBG: No results found for this basename: GLUCAP:5 in the last 168 hours     Signed:  Monty Mccarrell  [facility] 08/31/2012, 1:56 PM

## 2012-08-31 NOTE — Progress Notes (Signed)
  Echocardiogram 2D Echocardiogram has been performed.  Nirvi Boehler 08/31/2012, 9:30 AM

## 2012-08-31 NOTE — Plan of Care (Signed)
Problem: Phase III Progression Outcomes Goal: Pain controlled on oral analgesia Outcome: Completed/Met Date Met:  08/31/12 No C/O any pain.

## 2012-08-31 NOTE — H&P (Addendum)
[facility] History and Physical  GAYLORD SEYDEL ZOX:[id number] DOB: 01/14/1946 DOA: 08/30/2012   PCP: Dorcas Carrow, MD   Chief Complaint: Chest discomfort  HPI:  66 year old male with a history of obstructive sleep apnea and presumptive COPD presented with chest discomfort that began at 5 PM on August 30, 2012. At that time, the patient actually felt some palpitations and fluttering in his chest. His wife felt his pulse and felt to be correct. He tried to take his blood pressure at home, but it not register well. As a result, EMS was activated and the patient was brought to emergency department. The patient was found to have atrial fibrillation with RVR. The patient spontaneously converted to sinus without any interventions.  Regarding the patient's chest discomfort, he described it as a pressure sensation that radiated to his neck. He also had sharp pain that radiated straight through to his back. CT angiogram of the chest was negative for pulmonary embolus or aortic dissection. Currently, the patient is pain-free. He had denied any dizziness, syncope, shortness of breath, nausea, or diaphoresis. The patient states that he had a stress test over 10 years ago which was negative. The patient denies any nausea, vomiting, diarrhea, abdominal pain, dysuria, hematuria. He has never been told that he has had atrial fibrillation or other cardiac dysrhythmia in the past. Assessment/Plan: Atrial fibrillation with RVR -Heart rate up to 150 -Spontaneously converted back to sinus without medications, only given IV fluid -Echocardiogram -May need cardiology evaluation for possible stress test or heart catheterization -CHADS score=0, continue ASA Chest discomfort -Cycle troponins -Repeat EKG in the morning -Patient's father died of MI at age 2 Presumptive COPD -Patient smoked 3 packs per day x35 years, quit 1996 Obstructive sleep apnea -Patient wears CPAP at night, 12mm H2O  pressure Cannabis use -Urine drug screen positive for cannabis -Cessation discussed        Past Medical History  Diagnosis Date  . GERD (gastroesophageal reflux disease)   . ED (erectile dysfunction)   . Asthma   . Anxiety   . Vitamin D deficiency   . OSA (obstructive sleep apnea)     Uses CPAP machine  . Generalized OA   . DDD (degenerative disc disease)   . Myalgia and myositis   . Colon polyp    Past Surgical History  Procedure Date  . Rotator cuff tear and repair     Right side  . Lumbar laminectomy   . Lipoma from back    Social History:  reports that he has quit smoking. He has never used smokeless tobacco. He reports that he drinks about 1.8 ounces of alcohol per week. He reports that he does not use illicit drugs.   Family History  Problem Relation Age of Onset  . Breast cancer Mother     Metastatic  . Diabetes Maternal Aunt   . Heart attack Father     Died at age 63  . Colon cancer Neg Hx      Allergies  Allergen Reactions  . Codeine   . Hydrocodone   . Omeprazole   . Penicillins       Prior to Admission medications   Medication Sig Start Date End Date Taking? Authorizing Provider  tadalafil (CIALIS) 5 MG tablet Take 5 mg by mouth daily as needed. Erectile dysfunction   Yes Historical Provider, MD    Review of Systems:  Constitutional:  No weight loss, night sweats, Fevers, chills, fatigue.  Head&Eyes: No headache.  No vision  loss.  No eye pain or scotoma ENT:  No Difficulty swallowing,Tooth/dental problems,Sore throat,   Cardio-vascular:  No  Orthopnea, PND, swelling in lower extremities,  dizziness, palpitations  GI:  No  abdominal pain, nausea, vomiting, diarrhea, loss of appetite, hematochezia,  Resp:  No shortness of breath with exertion or at rest. No cough. No coughing up of blood  Skin:  no rash or lesions.  GU:  no dysuria, change in color of urine, no urgency or frequency. No flank pain.  Musculoskeletal:  No joint  pain or swelling. No decreased range of motion. No back pain.  Psych:  No change in mood or affect.  Neurologic: No headache, no dysesthesia, no focal weakness, no vision loss. No syncope  Physical Exam: Filed Vitals:   08/30/12 2130 08/30/12 2148 08/30/12 2200 08/31/12 0000  BP:  103/69 101/68 108/69  Pulse: 69 63 63 59  Resp: 12 19 14 17   SpO2: 97% 98% 99% 100%   General:  A&O x 3, NAD, nontoxic, pleasant/cooperative Head/Eye: No conjunctival hemorrhage, no icterus, [place]/AT ENT:  No icterus,  No thrush, good dentition, no pharyngeal exudate Neck:  No masses, no lymphadenpathy CV:  RRR, no rub, no gallop, no S3 Lung:  CTAB, good air movement, no wheeze, no rhonchi Abdomen: soft/NT, +BS, nondistended, no peritoneal signs Ext: No cyanosis, No rashes, No petechiae, No lymphangitis, No edema Neuro: CNII-XII intact, strength 4/5 in bilateral upper and lower extremities, no dysmetria  Labs on Admission:  Basic Metabolic Panel:  Lab 08/30/12 4540  NA 139  K 3.9  CL 103  CO2 27  GLUCOSE 113*  BUN 16  CREATININE 1.08  CALCIUM 8.9  MG 2.1  PHOS --   Liver Function Tests: No results found for this basename: AST:5,ALT:5,ALKPHOS:5,BILITOT:5,PROT:5,ALBUMIN:5 in the last 168 hours No results found for this basename: LIPASE:5,AMYLASE:5 in the last 168 hours No results found for this basename: AMMONIA:5 in the last 168 hours CBC:  Lab 08/30/12 2105  WBC 10.5  NEUTROABS 6.3  HGB 15.9  HCT 46.2  MCV 89.9  PLT 190   Cardiac Enzymes:  Lab 08/30/12 2105  CKTOTAL --  CKMB --  CKMBINDEX --  TROPONINI <0.30   BNP: No components found with this basename: POCBNP:5 CBG: No results found for this basename: GLUCAP:5 in the last 168 hours  Radiological Exams on Admission: Ct Angio Chest Pe W/cm &/or Wo Cm  08/30/2012  *RADIOLOGY REPORT*  Clinical Data: Chest pain, palpitations  CT ANGIOGRAPHY CHEST  Technique:  Multidetector CT imaging of the chest using the standard protocol  during bolus administration of intravenous contrast. Multiplanar reconstructed images including MIPs were obtained and reviewed to evaluate the vascular anatomy.  Contrast: OMNIPAQUE IOHEXOL 350 MG/ML SOLN  Comparison: Chest radiographs dated 08/30/2012.  Findings: No evidence of pulmonary embolism.  The lungs are essentially clear.  No focal consolidation.  No pleural effusion or pneumothorax.  Visualized thyroid is unremarkable.  The heart is normal in size.  No pericardial effusion. Atherosclerotic calcifications of the aortic arch.  No suspicious mediastinal, hilar, or axillary lymphadenopathy.  Visualized upper abdomen is unremarkable.  Degenerative changes of the visualized thoracolumbar spine.  IMPRESSION: No evidence of pulmonary embolism.  No evidence of acute cardiopulmonary disease.   Original Report Authenticated By: Charline Bills, M.D.    Dg Chest Port 1 View  08/30/2012  *RADIOLOGY REPORT*  Clinical Data: Chest pain; arrhythmia.  PORTABLE CHEST - 1 VIEW  Comparison: Chest radiograph performed 01/07/2005, and cardiac CT performed  01/08/2010  Findings: The lungs are well-aerated.  Chronic peribronchial thickening is noted.  There is no evidence of focal opacification, pleural effusion or pneumothorax.  The cardiomediastinal silhouette is within normal limits.  No acute osseous abnormalities are seen.  IMPRESSION: No acute cardiopulmonary process seen.  Chronic peribronchial thickening noted.   Original Report Authenticated By: Tonia Ghent, M.D.     EKG: Independently reviewed. EKG at 2054--atrial fibrillation, EKG at 2107--sinus rhythm    Time spent:70 minutes Code Status:   FULL Family Communication:   Family at bedside   Dannika Hilgeman, Egor, DO  [facility] Pager [phone]  If 7PM-7AM, please contact night-coverage [email or web address] Password [facility] 08/31/2012, 12:19 AM

## 2012-09-12 ENCOUNTER — Encounter: Payer: Self-pay | Admitting: Physician Assistant

## 2012-09-14 ENCOUNTER — Ambulatory Visit (INDEPENDENT_AMBULATORY_CARE_PROVIDER_SITE_OTHER): Payer: Medicare Other | Admitting: Physician Assistant

## 2012-09-14 ENCOUNTER — Encounter: Payer: Self-pay | Admitting: Physician Assistant

## 2012-09-14 VITALS — BP 110/80 | HR 64 | Ht 70.0 in | Wt 227.8 lb

## 2012-09-14 DIAGNOSIS — I4891 Unspecified atrial fibrillation: Secondary | ICD-10-CM

## 2012-09-14 NOTE — Patient Instructions (Addendum)
Your physician wants you to follow-up in: 6 MONTHS WITH DR. HOCHREIN. You will receive a reminder letter in the mail two months in advance. If you don't receive a letter, please call our office to schedule the follow-up appointment.   NO CHANGES WERE MADE TODAY  Your physician recommends that you continue on your current medications as directed. Please refer to the Current Medication list given to you today.

## 2012-09-14 NOTE — Progress Notes (Signed)
[address], [address] [place], [place]  [address] Phone: [phone], Fax:  [phone]  Date:  09/14/2012   Name:  Joseph Mcdaniel   DOB:  1946/07/15   MRN:  [id number]  PCP:  Dorcas Carrow, MD  Primary Cardiologist:  Dr. Rollene Rotunda  Primary Electrophysiologist:  None    History of Present Illness: Joseph Mcdaniel is a 67 y.o. male who returns for follow up after recent admission to the hospital for AFib with RVR.  He has a hx of normal coronary arteries and a Ca score of 0 by Cardiac CT in 2011, COPD, GERD, sleep apnea, obesity and fibromyalgia.  He was admitted 12/23-12/24. He presented with chest pressure and palpitations. He was noted to be in atrial fibrillation with RVR with heart rates in the 150s upon presentation. D-dimer was elevated and chest CT was negative for pulmonary embolism. Patient spontaneously converted to NSR. Cardiac markers were negative. He was seen by cardiology for evaluation. Echocardiogram 08/30/12: Normal LV wall thickness, EF 60%, normal wall motion, normal RV systolic function. CHADS2 score 0. Long-term anticoagulation was not recommended. He was given short acting diltiazem to take as needed for palpitations. Since discharge, he denies any further palpitations.  The patient denies chest pain, shortness of breath, syncope, orthopnea, PND or significant pedal edema.   Labs (12/13):   K 3.7, creatinine 1.09, ALT 19, Hgb 13.9, TSH 3.490, FT4 1.21, UDS + for THC   Wt Readings from Last 3 Encounters:  09/14/12 227 lb 12.8 oz (103.329 kg)  08/31/12 230 lb 2.6 oz (104.4 kg)  11/05/11 227 lb (102.967 kg)     Past Medical History  Diagnosis Date  . GERD (gastroesophageal reflux disease)   . ED (erectile dysfunction)   . Anxiety   . Vitamin D deficiency   . OSA (obstructive sleep apnea)     Uses CPAP machine  . Generalized OA   . DDD (degenerative disc disease)   . Myalgia and myositis   . Colon polyp   . Fibromyalgia   . A-fib      Recognized 08/2012- spont converted to NSR  . Hx of cardiac catheterization     cardiac CT 5/11: Ca score 0, normal cors  . Hx of echocardiogram     a. Echocardiogram 08/30/12: Normal LV wall thickness, EF 60%, normal wall motion, normal RV systolic function    Current Outpatient Prescriptions  Medication Sig Dispense Refill  . aspirin EC 325 MG EC tablet Take 1 tablet (325 mg total) by mouth daily.  30 tablet  1  . cholecalciferol (VITAMIN D) 1000 UNITS tablet Take 1,000 Units by mouth daily.      Marland Kitchen diltiazem (CARDIZEM) 30 MG tablet Take 1 tablet (30 mg total) by mouth daily as needed.  30 tablet  1  . fish oil-omega-3 fatty acids 1000 MG capsule Take 2 g by mouth daily.      . Multiple Vitamin (MULTIVITAMIN) capsule Take 1 capsule by mouth daily.      . tadalafil (CIALIS) 5 MG tablet Take 5 mg by mouth daily as needed. Erectile dysfunction        Allergies: Allergies  Allergen Reactions  . Codeine   . Hydrocodone   . Omeprazole   . Penicillins     Social History:  The patient  reports that he has quit smoking. He has never used smokeless tobacco. He reports that he drinks about 1.8 ounces of alcohol per week. He reports that  he does not use illicit drugs. Has used Marijuana for pain from Fibromyalgia.  PHYSICAL EXAM: VS:  BP 110/80  Pulse 64  Ht 5\' 10"  (1.778 m)  Wt 227 lb 12.8 oz (103.329 kg)  BMI 32.69 kg/m2 Well nourished, well developed, in no acute distress HEENT: normal Neck: no JVD Cardiac:  normal S1, S2; RRR; no murmur Lungs:  clear to auscultation bilaterally, no wheezing, rhonchi or rales Abd: soft, nontender, no hepatomegaly Ext: no edema Skin: warm and dry Neuro:  CNs 2-12 intact, no focal abnormalities noted  EKG:  NSR, HR 63, leftward axis, nonspecific ST-T wave changes      ASSESSMENT AND PLAN:  1. Paroxysmal Atrial Fibrillation:   No recurrent palpitations. He has diltiazem to use as needed. He can remain on aspirin. CHADS2 score remains 0. We  had a long discussion regarding the nature of atrial fibrillation and rationale for treatment. He was concerned that marijuana was leading to his atrial fibrillation. I explained to him that, as far as I know, there is no clear-cut evidence that this contributes to atrial fibrillation. However, I did explain to him that this is largely unknown. He can followup with me or Dr. Antoine Poche in 6 months or sooner as needed.  SignedTereso Newcomer, PA-C  11:01 AM 09/14/2012

## 2012-10-23 ENCOUNTER — Other Ambulatory Visit: Payer: Self-pay

## 2013-04-13 ENCOUNTER — Other Ambulatory Visit: Payer: Self-pay

## 2013-07-14 ENCOUNTER — Other Ambulatory Visit: Payer: Self-pay

## 2014-10-06 DIAGNOSIS — G4733 Obstructive sleep apnea (adult) (pediatric): Secondary | ICD-10-CM | POA: Diagnosis not present

## 2014-12-20 DIAGNOSIS — M542 Cervicalgia: Secondary | ICD-10-CM | POA: Diagnosis not present

## 2014-12-20 DIAGNOSIS — M5136 Other intervertebral disc degeneration, lumbar region: Secondary | ICD-10-CM | POA: Diagnosis not present

## 2014-12-20 DIAGNOSIS — M47816 Spondylosis without myelopathy or radiculopathy, lumbar region: Secondary | ICD-10-CM | POA: Diagnosis not present

## 2015-01-03 DIAGNOSIS — S134XXS Sprain of ligaments of cervical spine, sequela: Secondary | ICD-10-CM | POA: Diagnosis not present

## 2015-01-03 DIAGNOSIS — M542 Cervicalgia: Secondary | ICD-10-CM | POA: Diagnosis not present

## 2015-01-03 DIAGNOSIS — S335XXS Sprain of ligaments of lumbar spine, sequela: Secondary | ICD-10-CM | POA: Diagnosis not present

## 2015-01-03 DIAGNOSIS — M545 Low back pain: Secondary | ICD-10-CM | POA: Diagnosis not present

## 2015-01-08 ENCOUNTER — Telehealth: Payer: Self-pay | Admitting: Internal Medicine

## 2015-01-08 NOTE — Telephone Encounter (Signed)
Start by having pt take CPAP machine to DME to be checked. May have dirty filter. Make appointment as able

## 2015-01-08 NOTE — Telephone Encounter (Signed)
Spoke with pt's wife. Advised her of CY's recommendation. Offered her CY's next available appointment which is 03/14/15. She declined and made the comment that, "This is ridiculous, he shouldn't have to wait that long." States she is going to take the machine to [facility] and call us back if need be.

## 2015-01-08 NOTE — Telephone Encounter (Signed)
Pt last seen 2011 by CDY.  She reports pt CPAP machine has a lot of black residue on the outside and inside of machine. They did get new supplies for pt and now the tubing has black residue on the inside. She is concerned bc pt now has a sinus infection. She wants pt to be seen this week to re-establish for sleep or what else can be done? Please advise Dr. Annamaria Boots thanks

## 2015-01-08 NOTE — Telephone Encounter (Signed)
Made appt, message not needed/spm

## 2015-01-09 ENCOUNTER — Ambulatory Visit (INDEPENDENT_AMBULATORY_CARE_PROVIDER_SITE_OTHER)
Admission: RE | Admit: 2015-01-09 | Discharge: 2015-01-09 | Disposition: A | Discharge status: Home / Self Care | Payer: Medicare Other | Source: Ambulatory Visit | Attending: Internal Medicine | Admitting: Internal Medicine

## 2015-01-09 ENCOUNTER — Ambulatory Visit (INDEPENDENT_AMBULATORY_CARE_PROVIDER_SITE_OTHER): Payer: Medicare Other | Admitting: Internal Medicine

## 2015-01-09 ENCOUNTER — Encounter: Payer: Self-pay | Admitting: Internal Medicine

## 2015-01-09 VITALS — BP 114/74 | HR 60 | Ht 70.0 in | Wt 221.0 lb

## 2015-01-09 DIAGNOSIS — R059 Cough, unspecified: Secondary | ICD-10-CM

## 2015-01-09 DIAGNOSIS — R062 Wheezing: Secondary | ICD-10-CM

## 2015-01-09 DIAGNOSIS — J329 Chronic sinusitis, unspecified: Secondary | ICD-10-CM | POA: Diagnosis not present

## 2015-01-09 DIAGNOSIS — R0602 Shortness of breath: Secondary | ICD-10-CM | POA: Diagnosis not present

## 2015-01-09 DIAGNOSIS — Z7729 Contact with and (suspected ) exposure to other hazardous substances: Secondary | ICD-10-CM

## 2015-01-09 DIAGNOSIS — R0600 Dyspnea, unspecified: Secondary | ICD-10-CM | POA: Insufficient documentation

## 2015-01-09 DIAGNOSIS — R05 Cough: Secondary | ICD-10-CM

## 2015-01-09 DIAGNOSIS — R0982 Postnasal drip: Secondary | ICD-10-CM

## 2015-01-09 DIAGNOSIS — G4733 Obstructive sleep apnea (adult) (pediatric): Secondary | ICD-10-CM | POA: Diagnosis not present
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
## 2015-01-18 ENCOUNTER — Encounter (HOSPITAL_COMMUNITY): Payer: Medicare Other

## 2015-01-18 ENCOUNTER — Ambulatory Visit: Payer: Medicare Other | Admitting: Adult Health

## 2015-02-07 DIAGNOSIS — M47817 Spondylosis without myelopathy or radiculopathy, lumbosacral region: Secondary | ICD-10-CM | POA: Diagnosis not present

## 2015-02-22 DIAGNOSIS — M47816 Spondylosis without myelopathy or radiculopathy, lumbar region: Secondary | ICD-10-CM | POA: Diagnosis not present

## 2015-02-22 DIAGNOSIS — M5090 Cervical disc disorder, unspecified, unspecified cervical region: Secondary | ICD-10-CM | POA: Diagnosis not present

## 2015-02-22 DIAGNOSIS — M4807 Spinal stenosis, lumbosacral region: Secondary | ICD-10-CM | POA: Diagnosis not present

## 2015-04-18 DIAGNOSIS — G4733 Obstructive sleep apnea (adult) (pediatric): Secondary | ICD-10-CM | POA: Diagnosis not present

## 2015-04-18 DIAGNOSIS — M47817 Spondylosis without myelopathy or radiculopathy, lumbosacral region: Secondary | ICD-10-CM | POA: Diagnosis not present

## 2015-06-05 DIAGNOSIS — Z23 Encounter for immunization: Secondary | ICD-10-CM | POA: Diagnosis not present

## 2015-07-20 DIAGNOSIS — G4733 Obstructive sleep apnea (adult) (pediatric): Secondary | ICD-10-CM | POA: Diagnosis not present

## 2015-07-26 DIAGNOSIS — S335XXS Sprain of ligaments of lumbar spine, sequela: Secondary | ICD-10-CM | POA: Diagnosis not present

## 2015-07-26 DIAGNOSIS — M47816 Spondylosis without myelopathy or radiculopathy, lumbar region: Secondary | ICD-10-CM | POA: Diagnosis not present

## 2015-07-26 DIAGNOSIS — M542 Cervicalgia: Secondary | ICD-10-CM | POA: Diagnosis not present

## 2015-07-26 DIAGNOSIS — S134XXS Sprain of ligaments of cervical spine, sequela: Secondary | ICD-10-CM | POA: Diagnosis not present

## 2015-08-15 DIAGNOSIS — E781 Pure hyperglyceridemia: Secondary | ICD-10-CM | POA: Diagnosis not present

## 2015-08-15 DIAGNOSIS — E559 Vitamin D deficiency, unspecified: Secondary | ICD-10-CM | POA: Diagnosis not present

## 2015-08-15 DIAGNOSIS — Z125 Encounter for screening for malignant neoplasm of prostate: Secondary | ICD-10-CM | POA: Diagnosis not present

## 2015-08-22 DIAGNOSIS — E559 Vitamin D deficiency, unspecified: Secondary | ICD-10-CM | POA: Diagnosis not present

## 2015-08-22 DIAGNOSIS — N182 Chronic kidney disease, stage 2 (mild): Secondary | ICD-10-CM | POA: Diagnosis not present

## 2015-08-22 DIAGNOSIS — K219 Gastro-esophageal reflux disease without esophagitis: Secondary | ICD-10-CM | POA: Diagnosis not present

## 2015-08-22 DIAGNOSIS — F17211 Nicotine dependence, cigarettes, in remission: Secondary | ICD-10-CM | POA: Diagnosis not present

## 2015-08-22 DIAGNOSIS — E785 Hyperlipidemia, unspecified: Secondary | ICD-10-CM | POA: Diagnosis not present

## 2015-10-04 DIAGNOSIS — Z23 Encounter for immunization: Secondary | ICD-10-CM | POA: Diagnosis not present

## 2015-10-24 DIAGNOSIS — G4733 Obstructive sleep apnea (adult) (pediatric): Secondary | ICD-10-CM | POA: Diagnosis not present

## 2015-10-24 DIAGNOSIS — M545 Low back pain: Secondary | ICD-10-CM | POA: Diagnosis not present

## 2015-10-24 DIAGNOSIS — M50323 Other cervical disc degeneration at C6-C7 level: Secondary | ICD-10-CM | POA: Diagnosis not present

## 2015-10-24 DIAGNOSIS — M47816 Spondylosis without myelopathy or radiculopathy, lumbar region: Secondary | ICD-10-CM | POA: Diagnosis not present

## 2015-11-01 DIAGNOSIS — H2513 Age-related nuclear cataract, bilateral: Secondary | ICD-10-CM | POA: Diagnosis not present

## 2015-11-14 ENCOUNTER — Telehealth: Payer: Self-pay | Admitting: Internal Medicine

## 2015-11-14 NOTE — Telephone Encounter (Signed)
Spoke with patient's wife and she states he had been on pain medications and was given Movantik. It did not agree with him. He stopped it. He has since had an injection to his back and is not longer taking pain medication. For the last 3 weeks, he has been constipated again. She states he has taken Miralax(once dose), Magnesium Citrate, stool softeners, enemas with varied results.  She will try increasing Miralax up to 3 doses daily and see if this helps. She is asking to schedule OV also in case this does not help him. Scheduled OV with Dr. Havery [name] on 12/13/15 at 10:45 AM.

## 2015-11-22 DIAGNOSIS — M5033 Other cervical disc degeneration, cervicothoracic region: Secondary | ICD-10-CM | POA: Diagnosis not present

## 2015-11-22 DIAGNOSIS — M50323 Other cervical disc degeneration at C6-C7 level: Secondary | ICD-10-CM | POA: Diagnosis not present

## 2015-11-22 DIAGNOSIS — M50322 Other cervical disc degeneration at C5-C6 level: Secondary | ICD-10-CM | POA: Diagnosis not present

## 2015-11-22 DIAGNOSIS — M961 Postlaminectomy syndrome, not elsewhere classified: Secondary | ICD-10-CM | POA: Diagnosis not present

## 2015-11-27 ENCOUNTER — Encounter: Payer: Self-pay | Admitting: Internal Medicine

## 2015-12-13 ENCOUNTER — Ambulatory Visit (INDEPENDENT_AMBULATORY_CARE_PROVIDER_SITE_OTHER): Payer: Medicare Other | Admitting: Gastroenterology

## 2015-12-13 ENCOUNTER — Encounter: Payer: Self-pay | Admitting: Gastroenterology

## 2015-12-13 VITALS — BP 100/62 | HR 60 | Ht 70.0 in | Wt 227.2 lb

## 2015-12-13 DIAGNOSIS — R1032 Left lower quadrant pain: Secondary | ICD-10-CM

## 2015-12-13 DIAGNOSIS — Z86010 Personal history of colonic polyps: Secondary | ICD-10-CM

## 2015-12-13 DIAGNOSIS — K5900 Constipation, unspecified: Secondary | ICD-10-CM | POA: Diagnosis not present

## 2015-12-13 DIAGNOSIS — R194 Change in bowel habit: Secondary | ICD-10-CM

## 2015-12-13 MED ORDER — NA SULFATE-K SULFATE-MG SULF 17.5-3.13-1.6 GM/177ML PO SOLN: 1.0000 | Freq: Once | ORAL | Status: DC

## 2015-12-13 NOTE — Patient Instructions (Signed)
Continue Miralax.   You have been scheduled for a colonoscopy. Please follow written instructions given to you at your visit today.  Please pick up your prep supplies at the pharmacy within the next 1-3 days. If you use inhalers (even only as needed), please bring them with you on the day of your procedure. Your physician has requested that you go to [email or web address] and enter the access code given to you at your visit today. This web site gives a general overview about your procedure. However, you should still follow specific instructions given to you by our office regarding your preparation for the procedure.

## 2015-12-13 NOTE — Progress Notes (Signed)
HPI :  70 y/o male with PMH of chronic back pain and OSA here for evaluation of new bowel changes.  He has not been seen in this office in almost 4 years.  Patient has chronic back pains following an accident 2 years ago. He has been on some pain medications for this over recent months. He normally has injections to his back which keeps his pain at [place] however he had a hard time having this and led to a period of time using narcotics. While on narcotics he had severe constipation. He tried Movantik which did not help at all. He eventually was placed on Miralax , and on high dose it worked okay for him. If he does not use Miralax he will be quite constipated. He has since stopped his pain medications and is not on any narcotics at this time. He has injections every few months to control his back pain. He has been off narcotics since February. He reports his bowel habits never went back to normal. He endorses very thin stools and noticed change in caliber of his stools, are extremely thin. He has been trying to come off Miralax but it has not worked. When taking Miralax once daily, he will have one BM daily. If he doesn't take miralax he has a very hard time moving his bowels. He denies any blood in the stools. He has some low back pain and LLQ pain with bowel movements which is new. He thinks constipation can make his back feel worse. No unintentional weight loss. Eating okay. No vomiting. He has had some nausea if he doesn't move his bowels. No family history of colon cancer.   His wife is a Marine scientist and he has tried several over the counter laxatives - wife reports he has tried everything including enemas daily, which did not help. He has tried metamucil and other fiber supplements which did not help.   He feels okay on Miralax at present time but is concerned about something blocking his colon. 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    He has has blood work per PCM, no  anemia or abnormality done in January.   Colonoscopy 11/05/2011 - lipoma, hyperplastic polyps Colonoscopy 09/2007 - 1cm polyp - tubulovillous adenoma  Past Medical History  Diagnosis Date  . GERD (gastroesophageal reflux disease)   . ED (erectile dysfunction)   . Anxiety   . Vitamin D deficiency   . OSA (obstructive sleep apnea)     Uses CPAP machine  . Generalized OA   . DDD (degenerative disc disease)   . Myalgia and myositis   . Adenomatous colon polyp 09/2007  . Fibromyalgia   . A-fib [facility])     Recognized 08/2012- spont converted to NSR  . Hx of cardiac catheterization     cardiac CT 5/11: Ca score 0, normal cors  . Hx of echocardiogram     a. Echocardiogram 08/30/12: Normal LV wall thickness, EF 60%, normal wall motion, normal RV systolic function     Past Surgical History  Procedure Laterality Date  . Rotator cuff tear and repair Right   . Lumbar laminectomy    . Lipoma from back     Family History  Problem Relation Age of Onset  . Breast cancer Mother     Metastatic  . Diabetes Maternal Aunt   . Heart attack Father     Died at age 90  . Colon cancer Neg Hx  Social History  Substance Use Topics  . Smoking status: Former Smoker -- 1.50 packs/day for 30 years    Types: Cigarettes    Quit date: 09/08/1994  . Smokeless tobacco: Never Used     Comment: Quit smoking 1996, smoked for 30 years, pt smoke 3 packs per day for 1-2 years.   . Alcohol Use: 1.8 oz/week    3 Cans of beer per week     Comment: occ beer on weekends   Current Outpatient Prescriptions  Medication Sig Dispense Refill  . cholecalciferol (VITAMIN D) 1000 UNITS tablet Take 1,000 Units by mouth daily.    . fish oil-omega-3 fatty acids 1000 MG capsule Take 2 g by mouth daily.    . Multiple Vitamin (MULTIVITAMIN) capsule Take 1 capsule by mouth daily.    . polyethylene glycol powder (MIRALAX) powder Take 1 capful up to three times daily    . Probiotic Product (ALIGN) 4 MG CAPS Take 1 capsule by  mouth daily.    . tadalafil (CIALIS) 5 MG tablet Take 5 mg by mouth daily as needed. Erectile dysfunction     No current facility-administered medications for this visit.   Allergies  Allergen Reactions  . Codeine   . Hydrocodone   . Omeprazole   . Penicillins      Review of Systems: All systems reviewed and negative except where noted in HPI.   Lab Results  Component Value Date   WBC 8.1 08/31/2012   HGB 13.9 08/31/2012   HCT 42.0 08/31/2012   MCV 90.3 08/31/2012   PLT 158 08/31/2012   Lab Results  Component Value Date   ALT 19 11/05/2011   AST 17 11/05/2011   ALKPHOS 57 11/05/2011   BILITOT 0.5 11/05/2011     Physical Exam: BP 100/62 mmHg  Pulse 60  Ht 5\' 10"  (1.778 m)  Wt 227 lb 3.2 oz (103.057 kg)  BMI 32.60 kg/m2 Constitutional: Pleasant,well-developed, male in no acute distress. HEENT: Normocephalic and atraumatic. Conjunctivae are normal. No scleral icterus. Neck supple.  Cardiovascular: Normal rate, regular rhythm.  Pulmonary/chest: Effort normal and breath sounds normal. No wheezing, rales or rhonchi. Abdominal: Soft, nondistended, mild LLQ TTP without rebound or guarding Bowel sounds active throughout. There are no masses palpable. No hepatomegaly. Extremities: no edema Lymphadenopathy: No cervical adenopathy noted. Neurological: Alert and oriented to person place and time. Skin: Skin is warm and dry. No rashes noted. Psychiatric: Normal mood and affect. Behavior is normal.   ASSESSMENT AND PLAN: 70 y/o male with history of chronic back pain, with history of acute bowel changes since taking narcotics earlier this year. He has since come off narcotics a few months ago but endorses ongoing narrow caliber stools and LLQ pains. Miralax is working to prevent constipation at this time however he states he won't have a bowel movement without it and has not had a normal caliber stool in months. He and his wife are quite concerned about these symptoms, acute  onset, and lack of improvement after stopping narcotics. There are concerned about interval development of polyp / mass lesion. He had a remote history of a tubulovillous adenoma. He simply could have had a change in motility causing this and triggered by nartocis, however I offered them a colonoscopy to ensure no polyp lesion or significant diverticulosis which could be driving this presentation. Following a discussion of risks / benefits of colonoscopy, he wished to proceed. In the interim he will continue miralax for now. All questions answered.  Eureka Cellar, MD [facility] Pager [phone]

## 2015-12-18 DIAGNOSIS — M170 Bilateral primary osteoarthritis of knee: Secondary | ICD-10-CM | POA: Diagnosis not present

## 2016-01-04 ENCOUNTER — Ambulatory Visit (AMBULATORY_SURGERY_CENTER): Payer: Medicare Other | Admitting: Gastroenterology

## 2016-01-04 ENCOUNTER — Encounter: Payer: Self-pay | Admitting: Gastroenterology

## 2016-01-04 VITALS — BP 114/71 | HR 52 | Temp 98.0°F | Resp 12 | Ht 70.0 in | Wt 227.0 lb

## 2016-01-04 DIAGNOSIS — R194 Change in bowel habit: Secondary | ICD-10-CM

## 2016-01-04 DIAGNOSIS — D123 Benign neoplasm of transverse colon: Secondary | ICD-10-CM | POA: Diagnosis not present

## 2016-01-04 DIAGNOSIS — D122 Benign neoplasm of ascending colon: Secondary | ICD-10-CM | POA: Diagnosis not present

## 2016-01-04 DIAGNOSIS — M797 Fibromyalgia: Secondary | ICD-10-CM | POA: Diagnosis not present

## 2016-01-04 DIAGNOSIS — D125 Benign neoplasm of sigmoid colon: Secondary | ICD-10-CM

## 2016-01-04 DIAGNOSIS — Z86010 Personal history of colonic polyps: Secondary | ICD-10-CM | POA: Diagnosis not present

## 2016-01-04 MED ORDER — SODIUM CHLORIDE 0.9 % IV SOLN: 500.0000 mL | INTRAVENOUS | Status: DC

## 2016-01-04 NOTE — Patient Instructions (Signed)
Discharge instructions given. Handout on polyps and hemorrhoids. Resume previous medications. YOU HAD AN ENDOSCOPIC PROCEDURE TODAY AT THE [facility]:   Refer to the procedure report that was given to you for any specific questions about what was found during the examination.  If the procedure report does not answer your questions, please call your gastroenterologist to clarify.  If you requested that your care partner not be given the details of your procedure findings, then the procedure report has been included in a sealed envelope for you to review at your convenience later.  YOU SHOULD EXPECT: Some feelings of bloating in the abdomen. Passage of more gas than usual.  Walking can help get rid of the air that was put into your GI tract during the procedure and reduce the bloating. If you had a lower endoscopy (such as a colonoscopy or flexible sigmoidoscopy) you may notice spotting of blood in your stool or on the toilet paper. If you underwent a bowel prep for your procedure, you may not have a normal bowel movement for a few days.  Please Note:  You might notice some irritation and congestion in your nose or some drainage.  This is from the oxygen used during your procedure.  There is no need for concern and it should clear up in a day or so.  SYMPTOMS TO REPORT IMMEDIATELY:   Following lower endoscopy (colonoscopy or flexible sigmoidoscopy):  Excessive amounts of blood in the stool  Significant tenderness or worsening of abdominal pains  Swelling of the abdomen that is new, acute  Fever of 100F or higher   For urgent or emergent issues, a gastroenterologist can be reached at any hour by calling [phone].   DIET: Your first meal following the procedure should be a small meal and then it is ok to progress to your normal diet. Heavy or fried foods are harder to digest and may make you feel nauseous or bloated.  Likewise, meals heavy in dairy and vegetables can increase  bloating.  Drink plenty of fluids but you should avoid alcoholic beverages for 24 hours.  ACTIVITY:  You should plan to take it easy for the rest of today and you should NOT DRIVE or use heavy machinery until tomorrow (because of the sedation medicines used during the test).    FOLLOW UP: Our staff will call the number listed on your records the next business day following your procedure to check on you and address any questions or concerns that you may have regarding the information given to you following your procedure. If we do not reach you, we will leave a message.  However, if you are feeling well and you are not experiencing any problems, there is no need to return our call.  We will assume that you have returned to your regular daily activities without incident.  If any biopsies were taken you will be contacted by phone or by letter within the next 1-3 weeks.  Please call us at [phone] if you have not heard about the biopsies in 3 weeks.    SIGNATURES/CONFIDENTIALITY: You and/or your care partner have signed paperwork which will be entered into your electronic medical record.  These signatures attest to the fact that that the information above on your After Visit Summary has been reviewed and is understood.  Full responsibility of the confidentiality of this discharge information lies with you and/or your care-partner. 

## 2016-01-04 NOTE — Progress Notes (Signed)
Called to room to assist during endoscopic procedure.  Patient ID and intended procedure confirmed with present staff. Received instructions for my participation in the procedure from the performing physician.  

## 2016-01-04 NOTE — Progress Notes (Addendum)
No egg or soy allergy known to patient  No issues with past sedation with any surgeries  or procedures, no intubation problems  No diet pills per patient No home 02 use per patient  No blood thinners per patient  Pt has  issues with constipation when takes hydrocodone but no other problems with constipation- uses miralax as needed

## 2016-01-04 NOTE — Op Note (Signed)
[facility] Patient Name: Joseph Mcdaniel Procedure Date: 01/04/2016 2:51 PM MRN: [id number] Endoscopist: Remo Lipps P. Havery [name] , MD Age: 70 Date of Birth: August 13, 1946 Gender: Male Procedure:                Colonoscopy Indications:              Change in bowel habits, Change in stool caliber Medicines:                Monitored Anesthesia Care Procedure:                Pre-Anesthesia Assessment:                           - Prior to the procedure, a History and Physical                            was performed, and patient medications and                            allergies were reviewed. The patient's tolerance of                            previous anesthesia was also reviewed. The risks                            and benefits of the procedure and the sedation                            options and risks were discussed with the patient.                            All questions were answered, and informed consent                            was obtained. Prior Anticoagulants: The patient has                            taken no previous anticoagulant or antiplatelet                            agents. ASA Grade Assessment: III - A patient with                            severe systemic disease. After reviewing the risks                            and benefits, the patient was deemed in                            satisfactory condition to undergo the procedure.                           After obtaining informed consent, the colonoscope  was passed under direct vision. Throughout the                            procedure, the patient's blood pressure, pulse, and                            oxygen saturations were monitored continuously. The                            Model CF-HQ190L [id number]) scope was introduced                            through the anus and advanced to the the terminal                            ileum, with identification of the  appendiceal                            orifice and IC valve. The colonoscopy was performed                            without difficulty. The patient tolerated the                            procedure well. The quality of the bowel                            preparation was adequate. The terminal ileum,                            ileocecal valve, appendiceal orifice, and rectum                            were photographed. Scope In: 2:58:00 PM Scope Out: 3:19:13 PM Scope Withdrawal Time: 0 hours 16 minutes 52 seconds  Total Procedure Duration: 0 hours 21 minutes 13 seconds  Findings:                 The perianal and digital rectal examinations were                            normal.                           A 4 mm polyp was found in the ascending colon. The                            polyp was sessile. The polyp was removed with a                            cold snare. Resection and retrieval were complete.                           A 5 mm polyp was found in the transverse colon. The  polyp was sessile. The polyp was removed with a                            cold snare. Resection and retrieval were complete.                           A 4 mm polyp was found in the sigmoid colon. The                            polyp was sessile. The polyp was removed with a                            cold snare. Resection and retrieval were complete.                           The terminal ileum appeared normal.                           Non-bleeding internal hemorrhoids were found during                            retroflexion. The hemorrhoids were small.                           The exam was otherwise without abnormality. No                            abnormality noted which could cause the patient's                            changes in bowel symptoms, which perhaps were due                            to change in medications (narcotics) Complications:            No immediate  complications. Estimated blood loss:                            Minimal. Estimated Blood Loss:     Estimated blood loss was minimal. Impression:               - One 4 mm polyp in the ascending colon, removed                            with a cold snare. Resected and retrieved.                           - One 5 mm polyp in the transverse colon, removed                            with a cold snare. Resected and retrieved.                           - One 4 mm polyp in the  sigmoid colon, removed with                            a cold snare. Resected and retrieved.                           - The examined portion of the ileum was normal.                           - Non-bleeding internal hemorrhoids.                           - The examination was otherwise normal.                           Suspect symptoms were likely due to prior                            medication use. Recommendation:           - Patient has a contact number available for                            emergencies. The signs and symptoms of potential                            delayed complications were discussed with the                            patient. Return to normal activities tomorrow.                            Written discharge instructions were provided to the                            patient.                           - Resume previous diet.                           - Continue present medications.                           - No aspirin, ibuprofen, naproxen, or other                            non-steroidal anti-inflammatory drugs for 2 weeks                            after polyp removal.                           - Await pathology results.                           - Repeat colonoscopy is recommended for  surveillance. The colonoscopy date will be                            determined after pathology results from today's                            exam become available for  review. Remo Lipps P. Havery [name], MD 01/04/2016 3:25:34 PM This report has been signed electronically.

## 2016-01-04 NOTE — Progress Notes (Signed)
To recovery, report to [employer], VSS.

## 2016-01-07 ENCOUNTER — Telehealth: Payer: Self-pay | Admitting: *Deleted

## 2016-01-07 NOTE — Telephone Encounter (Signed)
  Follow up Call-  Call back number 01/04/2016  Post procedure Call Back phone  # [phone] wife   Permission to leave phone message Yes     Patient questions:  Do you have a fever, pain , or abdominal swelling? No. Pain Score  0 *  Have you tolerated food without any problems? Yes.    Have you been able to return to your normal activities? Yes.    Do you have any questions about your discharge instructions: Diet   No. Medications  No. Follow up visit  No.  Do you have questions or concerns about your Care? No.  Actions: * If pain score is 4 or above: No action needed, pain <4.

## 2016-01-14 ENCOUNTER — Encounter: Payer: Self-pay | Admitting: Gastroenterology

## 2016-01-21 DIAGNOSIS — M170 Bilateral primary osteoarthritis of knee: Secondary | ICD-10-CM | POA: Diagnosis not present

## 2016-01-21 DIAGNOSIS — M1711 Unilateral primary osteoarthritis, right knee: Secondary | ICD-10-CM | POA: Diagnosis not present

## 2016-01-21 DIAGNOSIS — M1712 Unilateral primary osteoarthritis, left knee: Secondary | ICD-10-CM | POA: Diagnosis not present

## 2016-01-22 DIAGNOSIS — G4733 Obstructive sleep apnea (adult) (pediatric): Secondary | ICD-10-CM | POA: Diagnosis not present

## 2016-01-30 DIAGNOSIS — M170 Bilateral primary osteoarthritis of knee: Secondary | ICD-10-CM | POA: Diagnosis not present

## 2016-01-30 DIAGNOSIS — M1712 Unilateral primary osteoarthritis, left knee: Secondary | ICD-10-CM | POA: Diagnosis not present

## 2016-01-30 DIAGNOSIS — M1711 Unilateral primary osteoarthritis, right knee: Secondary | ICD-10-CM | POA: Diagnosis not present

## 2016-02-06 DIAGNOSIS — M170 Bilateral primary osteoarthritis of knee: Secondary | ICD-10-CM | POA: Diagnosis not present

## 2016-02-06 DIAGNOSIS — M1711 Unilateral primary osteoarthritis, right knee: Secondary | ICD-10-CM | POA: Diagnosis not present

## 2016-02-06 DIAGNOSIS — M1712 Unilateral primary osteoarthritis, left knee: Secondary | ICD-10-CM | POA: Diagnosis not present

## 2016-02-12 ENCOUNTER — Telehealth: Payer: Self-pay | Admitting: Internal Medicine

## 2016-02-12 NOTE — Telephone Encounter (Signed)
Patient calling because his CPAP is broken and [facility] advised them that they will need new CPAP machine.  Patient advised that he will need OV to get new CPAP machine.  Scheduled to see Dr. Annamaria Boots tomorrow at 1045.  Patient aware of appointment and aware that he needs to bring SD card so we can get a download from his old machine. Nothing further needed.

## 2016-02-13 ENCOUNTER — Encounter: Payer: Self-pay | Admitting: Internal Medicine

## 2016-02-13 ENCOUNTER — Ambulatory Visit (INDEPENDENT_AMBULATORY_CARE_PROVIDER_SITE_OTHER): Payer: Medicare Other | Admitting: Internal Medicine

## 2016-02-13 ENCOUNTER — Telehealth: Payer: Self-pay | Admitting: Internal Medicine

## 2016-02-13 VITALS — BP 124/78 | HR 57 | Ht 70.0 in | Wt 225.0 lb

## 2016-02-13 DIAGNOSIS — I480 Paroxysmal atrial fibrillation: Secondary | ICD-10-CM

## 2016-02-13 DIAGNOSIS — G4733 Obstructive sleep apnea (adult) (pediatric): Secondary | ICD-10-CM | POA: Diagnosis not present

## 2016-02-13 NOTE — Assessment & Plan Note (Signed)
He is now on Medicare and almost certainly will be required to have an updated sleep study to qualify for replacement machine.  Plan-schedule sleep study to requalify. Replacement for CPAP machine, auto titration and adding AirView

## 2016-02-13 NOTE — Assessment & Plan Note (Signed)
Currently in sinus rhythm by exam

## 2016-02-13 NOTE — Telephone Encounter (Signed)
Spoke with pt's wife and she states that pt was told today that he would need a new sleep study. I reviewed today's OV recommendations and advised her that that was not what had been discussed. I explained that we do have to tell pt's that they MAY need a new sleep study depending on their download compliance with their current CPAP machine. I also explained that it is up to Medicare as to whether or not pt will need a new sleep study. She also states that she spoke with [facility] and they did not have the order we sent today. I explained that it does take some time for them to receive the order and enter it into their system. She states that she will call them again tomorrow and check on the order. Nothing further needed.

## 2016-02-13 NOTE — Patient Instructions (Signed)
Order- schedule unattended Home Sleep Test     Dx OSA  Order- [facility]- replacement for broken CPAP machine to continue current pressure, mask of choice, humidifier, supplies, AirView     Dx OSA  Please call as needed

## 2016-02-13 NOTE — Progress Notes (Signed)
02/13/2016-70 year old male former smoker reestablishing for management of OSA, complicated by PAfib Former patient; has broken CPAP machine and will need new order. DME: [facility]. DL attached. NPSG 08/19/99- AHI 90.6/ hr, desat to 59%, body weight 226 lbs He reports using CPAP every night with original machine. The switch now has failed so he has to plug the machine and unplug it to charted on and off. Sleep quality of life much better with CPAP. He denies awareness of lung disease. No ENT surgery.  Prior to Admission medications   Medication Sig Start Date End Date Taking? Authorizing Provider  cholecalciferol (VITAMIN D) 1000 UNITS tablet Take 1,000 Units by mouth daily.   Yes Historical Provider, MD  fish oil-omega-3 fatty acids 1000 MG capsule Take 2 g by mouth daily.   Yes Historical Provider, MD  Multiple Vitamin (MULTIVITAMIN) capsule Take 1 capsule by mouth daily.   Yes Historical Provider, MD  polyethylene glycol powder (MIRALAX) powder Take 1 capful up to three times daily   Yes Historical Provider, MD  Probiotic Product (ALIGN) 4 MG CAPS Take 1 capsule by mouth daily.   Yes Historical Provider, MD  tadalafil (CIALIS) 5 MG tablet Take 5 mg by mouth daily as needed. Erectile dysfunction   Yes Historical Provider, MD   Past Medical History  Diagnosis Date  . GERD (gastroesophageal reflux disease)   . ED (erectile dysfunction)   . Anxiety   . Vitamin D deficiency   . OSA (obstructive sleep apnea)     Uses CPAP machine  . Generalized OA   . DDD (degenerative disc disease)   . Myalgia and myositis   . Adenomatous colon polyp 09/2007  . Fibromyalgia   . A-fib [facility])     Recognized 08/2012- spont converted to NSR  . Hx of cardiac catheterization     cardiac CT 5/11: Ca score 0, normal cors  . Hx of echocardiogram     a. Echocardiogram 08/30/12: Normal LV wall thickness, EF 60%, normal wall motion, normal RV systolic function  . Sleep apnea     wears cpap  . Cataract     bilateral    Past Surgical History  Procedure Laterality Date  . Rotator cuff tear and repair Right   . Lumbar laminectomy    . Lipoma from back    . Colonoscopy    . Polypectomy     Family History  Problem Relation Age of Onset  . Breast cancer Mother     Metastatic  . Diabetes Maternal Aunt   . Heart attack Father     Died at age 73  . Colon cancer Neg Hx   . Colon polyps Neg Hx   . Rectal cancer Neg Hx   . Stomach cancer Neg Hx   . Esophageal cancer Neg Hx    Social History   Social History  . Marital Status: Married    Spouse Name: N/A  . Number of Children: 4  . Years of Education: N/A   Occupational History  . Retired     PT @ [employer]   Social History Main Topics  . Smoking status: Former Smoker -- 1.50 packs/day for 30 years    Types: Cigarettes    Quit date: 09/08/1994  . Smokeless tobacco: Never Used     Comment: Quit smoking 1996, smoked for 30 years, pt smoke 3 packs per day for 1-2 years.   . Alcohol Use: 1.8 oz/week    3 Cans of beer per week  Comment: occ beer on weekends  . Drug Use: No  . Sexual Activity: Not on file   Other Topics Concern  . Not on file   Social History Narrative   Patient disabled due to multiple orthopedic problems.   ROS-see HPI   Negative unless "+" Constitutional:    weight loss, night sweats, fevers, chills, fatigue, lassitude. HEENT:    headaches, difficulty swallowing, tooth/dental problems, sore throat,       sneezing, itching, ear ache, nasal congestion, post nasal drip, snoring CV:    chest pain, orthopnea, PND, swelling in lower extremities, anasarca,                                                    dizziness, palpitations Resp:   shortness of breath with exertion or at rest.                productive cough,   non-productive cough, coughing up of blood.              change in color of mucus.  wheezing.   Skin:    rash or lesions. GI:  No-   heartburn, indigestion, abdominal pain, nausea, vomiting, diarrhea,                  change in bowel habits, loss of appetite GU: dysuria, change in color of urine, no urgency or frequency.   flank pain. MS:   joint pain, stiffness, decreased range of motion, back pain. Neuro-     nothing unusual Psych:  change in mood or affect.  depression or anxiety.   memory loss.  OBJ- Physical Exam General- Alert, Oriented, Affect-appropriate, Distress- none acute Skin- rash-none, lesions- none, excoriation- none Lymphadenopathy- none Head- atraumatic            Eyes- Gross vision intact, PERRLA, conjunctivae and secretions clear            Ears- Hearing, canals-normal            Nose- Clear, no-Septal dev, mucus, polyps, erosion, perforation             Throat- Mallampati II-III , mucosa clear , drainage- none, tonsils- atrophic Neck- flexible , trachea midline, no stridor , thyroid nl, carotid no bruit Chest - symmetrical excursion , unlabored           Heart/CV- RRR , no murmur , no gallop  , no rub, nl s1 s2                           - JVD- none , edema- none, stasis changes- none, varices- none           Lung- clear to P&A, wheeze- none, cough- none , dullness-none, rub- none           Chest wall-  Abd-  Br/ Gen/ Rectal- Not done, not indicated Extrem- cyanosis- none, clubbing, none, atrophy- none, strength- nl Neuro- grossly intact to observation

## 2016-02-14 DIAGNOSIS — E559 Vitamin D deficiency, unspecified: Secondary | ICD-10-CM | POA: Diagnosis not present

## 2016-02-14 DIAGNOSIS — N182 Chronic kidney disease, stage 2 (mild): Secondary | ICD-10-CM | POA: Diagnosis not present

## 2016-02-14 DIAGNOSIS — Z0001 Encounter for general adult medical examination with abnormal findings: Secondary | ICD-10-CM | POA: Diagnosis not present

## 2016-02-14 DIAGNOSIS — E785 Hyperlipidemia, unspecified: Secondary | ICD-10-CM | POA: Diagnosis not present

## 2016-02-15 ENCOUNTER — Telehealth: Payer: Self-pay | Admitting: Internal Medicine

## 2016-02-15 DIAGNOSIS — G4733 Obstructive sleep apnea (adult) (pediatric): Secondary | ICD-10-CM

## 2016-02-15 NOTE — Telephone Encounter (Signed)
Order placed with CPAP setting included

## 2016-02-20 ENCOUNTER — Telehealth: Payer: Self-pay | Admitting: Internal Medicine

## 2016-02-20 NOTE — Telephone Encounter (Signed)
Joseph Mcdaniel it is the one from 02/15/16 it says no signature required but it does need to be signed Joseph Mcdaniel

## 2016-02-20 NOTE — Telephone Encounter (Signed)
Dr young needs to sign order Joellen Jersey

## 2016-02-20 NOTE — Telephone Encounter (Signed)
There are no orders to be co-signed on patient in his in basket.

## 2016-02-21 NOTE — Telephone Encounter (Signed)
Spoke with Libby-we are faxing over paper form to [facility] with CY's signature on it. Nothing more needed at this time.

## 2016-02-28 DIAGNOSIS — E559 Vitamin D deficiency, unspecified: Secondary | ICD-10-CM | POA: Diagnosis not present

## 2016-02-28 DIAGNOSIS — E785 Hyperlipidemia, unspecified: Secondary | ICD-10-CM | POA: Diagnosis not present

## 2016-02-28 DIAGNOSIS — N182 Chronic kidney disease, stage 2 (mild): Secondary | ICD-10-CM | POA: Diagnosis not present

## 2016-02-28 DIAGNOSIS — R079 Chest pain, unspecified: Secondary | ICD-10-CM | POA: Diagnosis not present

## 2016-02-28 DIAGNOSIS — F17211 Nicotine dependence, cigarettes, in remission: Secondary | ICD-10-CM | POA: Diagnosis not present

## 2016-03-06 DIAGNOSIS — G4733 Obstructive sleep apnea (adult) (pediatric): Secondary | ICD-10-CM | POA: Diagnosis not present

## 2016-03-18 ENCOUNTER — Other Ambulatory Visit (HOSPITAL_COMMUNITY): Payer: Self-pay | Admitting: Internal Medicine

## 2016-03-18 DIAGNOSIS — R079 Chest pain, unspecified: Secondary | ICD-10-CM

## 2016-03-20 ENCOUNTER — Telehealth (HOSPITAL_COMMUNITY): Payer: Self-pay | Admitting: *Deleted

## 2016-03-20 NOTE — Telephone Encounter (Signed)
Patient unable to hear on phone for instructions. Patient verbally informed me to give instructions to his wife [name]. Patient's wife given detailed instructions per Myocardial Perfusion Study Information Sheet for the test on 03/25/16 at 0745. Patient notified to arrive 15 minutes early and that it is imperative to arrive on time for appointment to keep from having the test rescheduled.  If you need to cancel or reschedule your appointment, please call the office within 24 hours of your appointment. Failure to do so may result in a cancellation of your appointment, and a $50 no show fee. Patient verbalized understanding.Joseph Mcdaniel, Ranae Palms

## 2016-03-25 ENCOUNTER — Ambulatory Visit (HOSPITAL_COMMUNITY): Payer: Medicare Other | Attending: Internal Medicine

## 2016-03-25 DIAGNOSIS — R079 Chest pain, unspecified: Secondary | ICD-10-CM | POA: Diagnosis not present

## 2016-03-25 DIAGNOSIS — R9439 Abnormal result of other cardiovascular function study: Secondary | ICD-10-CM | POA: Diagnosis not present

## 2016-03-25 DIAGNOSIS — I4891 Unspecified atrial fibrillation: Secondary | ICD-10-CM | POA: Diagnosis not present

## 2016-03-25 LAB — MYOCARDIAL PERFUSION IMAGING
CSEPPHR: 78 {beats}/min
LHR: 0.38
LV dias vol: 139 mL (ref 62–150)
LVSYSVOL: 74 mL
Rest HR: 45 {beats}/min
SDS: 2
SRS: 1
SSS: 3
TID: 1

## 2016-03-25 MED ORDER — TECHNETIUM TC 99M TETROFOSMIN IV KIT
10.7000 | PACK | Freq: Once | INTRAVENOUS | Status: AC | PRN
  Administered 2016-03-25: 11 via INTRAVENOUS
  Filled 2016-03-25: qty 11

## 2016-03-25 MED ORDER — TECHNETIUM TC 99M TETROFOSMIN IV KIT
32.2000 | PACK | Freq: Once | INTRAVENOUS | Status: AC | PRN
  Administered 2016-03-25: 32.2 via INTRAVENOUS
  Filled 2016-03-25: qty 32

## 2016-03-25 MED ORDER — REGADENOSON 0.4 MG/5ML IV SOLN
0.4000 mg | Freq: Once | INTRAVENOUS | Status: AC
  Administered 2016-03-25: 0.4 mg via INTRAVENOUS

## 2016-04-05 DIAGNOSIS — G4733 Obstructive sleep apnea (adult) (pediatric): Secondary | ICD-10-CM | POA: Diagnosis not present

## 2016-05-06 DIAGNOSIS — G4733 Obstructive sleep apnea (adult) (pediatric): Secondary | ICD-10-CM | POA: Diagnosis not present

## 2016-05-09 DIAGNOSIS — G4733 Obstructive sleep apnea (adult) (pediatric): Secondary | ICD-10-CM | POA: Diagnosis not present

## 2016-05-15 ENCOUNTER — Encounter: Payer: Self-pay | Admitting: Internal Medicine

## 2016-05-15 ENCOUNTER — Ambulatory Visit (INDEPENDENT_AMBULATORY_CARE_PROVIDER_SITE_OTHER): Payer: Medicare Other | Admitting: Internal Medicine

## 2016-05-15 VITALS — BP 126/74 | HR 64 | Ht 70.0 in | Wt 225.4 lb

## 2016-05-15 DIAGNOSIS — J449 Chronic obstructive pulmonary disease, unspecified: Secondary | ICD-10-CM

## 2016-05-15 DIAGNOSIS — G4733 Obstructive sleep apnea (adult) (pediatric): Secondary | ICD-10-CM | POA: Diagnosis not present

## 2016-05-15 NOTE — Patient Instructions (Signed)
Order- DME [facility]) continue CPAP 13, mask of choice, humidifier, supplies                    Please add AirView    Dx OSA

## 2016-05-15 NOTE — Progress Notes (Signed)
02/13/2016-70 year old male former smoker reestablishing for management of OSA, complicated by PAfib Former patient; has broken CPAP machine and will need new order. DME: [facility]. DL attached. NPSG 08/19/99- AHI 90.6/ hr, desat to 59%, body weight 226 lbs He reports using CPAP every night with original machine. The switch now has failed so he has to plug the machine and unplug it to charted on and off. Sleep quality of life much better with CPAP. He denies awareness of lung disease. No ENT surgery.  05/15/2016-71 year old male former smoker followed for OSA, complicated by PAFib CPAP 13/Advanced FOLLOWS FOR: DME:[facility] ([place] [facility]) Not in AirView and no recent DL. Pt wears CPAP every night for at least 8 hours; pressure works well for patient. No new supplies needed at this time.  He says he is very comfortable with CPAP at current pressure using nasal pillows  ROS-see HPI   Negative unless "+" Constitutional:    weight loss, night sweats, fevers, chills, fatigue, lassitude. HEENT:    headaches, difficulty swallowing, tooth/dental problems, sore throat,       sneezing, itching, ear ache, nasal congestion, post nasal drip, snoring CV:    chest pain, orthopnea, PND, swelling in lower extremities, anasarca,                                                    dizziness, palpitations Resp:   shortness of breath with exertion or at rest.                productive cough,   non-productive cough, coughing up of blood.              change in color of mucus.  wheezing.   Skin:    rash or lesions. GI:  No-   heartburn, indigestion, abdominal pain, nausea, vomiting, diarrhea,                 change in bowel habits, loss of appetite GU: dysuria, change in color of urine, no urgency or frequency.   flank pain. MS:   joint pain, stiffness, decreased range of motion, back pain. Neuro-     nothing unusual Psych:  change in mood or affect.  depression or anxiety.   memory loss.  OBJ- Physical Exam General-  Alert, Oriented, Affect-appropriate, Distress- none acute Skin- rash-none, lesions- none, excoriation- none Lymphadenopathy- none Head- atraumatic            Eyes- Gross vision intact, PERRLA, conjunctivae and secretions clear            Ears- Hearing, canals-normal            Nose- Clear, no-Septal dev, mucus, polyps, erosion, perforation             Throat- Mallampati II-III , mucosa clear , drainage- none, tonsils- atrophic Neck- flexible , trachea midline, no stridor , thyroid nl, carotid no bruit Chest - symmetrical excursion , unlabored           Heart/CV- RRR , no murmur , no gallop  , no rub, nl s1 s2                           - JVD- none , edema- none, stasis changes- none, varices- none           Lung- clear  to P&A, wheeze- none, cough- none , dullness-none, rub- none           Chest wall-  Abd-  Br/ Gen/ Rectal- Not done, not indicated Extrem- cyanosis- none, clubbing, none, atrophy- none, strength- nl Neuro- grossly intact to observation

## 2016-05-18 NOTE — Assessment & Plan Note (Signed)
No recent exacerbation. Breathing feels comfortable and stable.

## 2016-05-18 NOTE — Assessment & Plan Note (Signed)
He indicates much improved quality of life since he has been using CPAP with no concerns or questions. Machine replaced last year. Plan-continue CPAP 13. We need download for compliance and control documentation.

## 2016-05-22 DIAGNOSIS — R05 Cough: Secondary | ICD-10-CM | POA: Diagnosis not present

## 2016-05-22 DIAGNOSIS — J22 Unspecified acute lower respiratory infection: Secondary | ICD-10-CM | POA: Diagnosis not present

## 2016-06-04 DIAGNOSIS — M50322 Other cervical disc degeneration at C5-C6 level: Secondary | ICD-10-CM | POA: Diagnosis not present

## 2016-06-06 DIAGNOSIS — G4733 Obstructive sleep apnea (adult) (pediatric): Secondary | ICD-10-CM | POA: Diagnosis not present

## 2016-06-18 DIAGNOSIS — Z23 Encounter for immunization: Secondary | ICD-10-CM | POA: Diagnosis not present

## 2016-07-06 DIAGNOSIS — G4733 Obstructive sleep apnea (adult) (pediatric): Secondary | ICD-10-CM | POA: Diagnosis not present

## 2016-08-06 DIAGNOSIS — G4733 Obstructive sleep apnea (adult) (pediatric): Secondary | ICD-10-CM | POA: Diagnosis not present

## 2016-08-07 DIAGNOSIS — E785 Hyperlipidemia, unspecified: Secondary | ICD-10-CM | POA: Diagnosis not present

## 2016-08-07 DIAGNOSIS — Z0000 Encounter for general adult medical examination without abnormal findings: Secondary | ICD-10-CM | POA: Diagnosis not present

## 2016-08-07 DIAGNOSIS — Z125 Encounter for screening for malignant neoplasm of prostate: Secondary | ICD-10-CM | POA: Diagnosis not present

## 2016-08-07 DIAGNOSIS — E559 Vitamin D deficiency, unspecified: Secondary | ICD-10-CM | POA: Diagnosis not present

## 2016-08-12 DIAGNOSIS — G4733 Obstructive sleep apnea (adult) (pediatric): Secondary | ICD-10-CM | POA: Diagnosis not present

## 2016-08-21 DIAGNOSIS — N182 Chronic kidney disease, stage 2 (mild): Secondary | ICD-10-CM | POA: Diagnosis not present

## 2016-08-21 DIAGNOSIS — E785 Hyperlipidemia, unspecified: Secondary | ICD-10-CM | POA: Diagnosis not present

## 2016-08-21 DIAGNOSIS — E559 Vitamin D deficiency, unspecified: Secondary | ICD-10-CM | POA: Diagnosis not present

## 2016-09-05 DIAGNOSIS — G4733 Obstructive sleep apnea (adult) (pediatric): Secondary | ICD-10-CM | POA: Diagnosis not present

## 2016-10-06 DIAGNOSIS — G4733 Obstructive sleep apnea (adult) (pediatric): Secondary | ICD-10-CM | POA: Diagnosis not present

## 2016-11-05 DIAGNOSIS — M50322 Other cervical disc degeneration at C5-C6 level: Secondary | ICD-10-CM | POA: Diagnosis not present

## 2016-11-05 DIAGNOSIS — G4733 Obstructive sleep apnea (adult) (pediatric): Secondary | ICD-10-CM | POA: Diagnosis not present

## 2016-12-04 DIAGNOSIS — G4733 Obstructive sleep apnea (adult) (pediatric): Secondary | ICD-10-CM | POA: Diagnosis not present

## 2016-12-30 DIAGNOSIS — G4733 Obstructive sleep apnea (adult) (pediatric): Secondary | ICD-10-CM | POA: Diagnosis not present

## 2017-01-02 DIAGNOSIS — G4733 Obstructive sleep apnea (adult) (pediatric): Secondary | ICD-10-CM | POA: Diagnosis not present

## 2017-02-06 DIAGNOSIS — Z9109 Other allergy status, other than to drugs and biological substances: Secondary | ICD-10-CM | POA: Diagnosis not present

## 2017-02-06 DIAGNOSIS — K5901 Slow transit constipation: Secondary | ICD-10-CM | POA: Diagnosis not present

## 2017-02-18 DIAGNOSIS — M50323 Other cervical disc degeneration at C6-C7 level: Secondary | ICD-10-CM | POA: Diagnosis not present

## 2017-03-18 DIAGNOSIS — M961 Postlaminectomy syndrome, not elsewhere classified: Secondary | ICD-10-CM | POA: Diagnosis not present

## 2017-03-18 DIAGNOSIS — M47816 Spondylosis without myelopathy or radiculopathy, lumbar region: Secondary | ICD-10-CM | POA: Diagnosis not present

## 2017-04-02 DIAGNOSIS — G4733 Obstructive sleep apnea (adult) (pediatric): Secondary | ICD-10-CM | POA: Diagnosis not present

## 2017-04-28 DIAGNOSIS — M47816 Spondylosis without myelopathy or radiculopathy, lumbar region: Secondary | ICD-10-CM | POA: Diagnosis not present

## 2017-04-28 DIAGNOSIS — M545 Low back pain: Secondary | ICD-10-CM | POA: Diagnosis not present

## 2017-05-15 DIAGNOSIS — M1711 Unilateral primary osteoarthritis, right knee: Secondary | ICD-10-CM | POA: Diagnosis not present

## 2017-05-15 DIAGNOSIS — M1712 Unilateral primary osteoarthritis, left knee: Secondary | ICD-10-CM | POA: Diagnosis not present

## 2017-05-15 DIAGNOSIS — M170 Bilateral primary osteoarthritis of knee: Secondary | ICD-10-CM | POA: Diagnosis not present

## 2017-06-03 DIAGNOSIS — M170 Bilateral primary osteoarthritis of knee: Secondary | ICD-10-CM | POA: Diagnosis not present

## 2017-06-10 DIAGNOSIS — M170 Bilateral primary osteoarthritis of knee: Secondary | ICD-10-CM | POA: Diagnosis not present

## 2017-06-17 DIAGNOSIS — M170 Bilateral primary osteoarthritis of knee: Secondary | ICD-10-CM | POA: Diagnosis not present

## 2017-07-02 DIAGNOSIS — Z23 Encounter for immunization: Secondary | ICD-10-CM | POA: Diagnosis not present

## 2017-07-06 DIAGNOSIS — G4733 Obstructive sleep apnea (adult) (pediatric): Secondary | ICD-10-CM | POA: Diagnosis not present

## 2017-08-03 ENCOUNTER — Ambulatory Visit: Payer: Medicare Other | Admitting: Physician Assistant

## 2017-08-03 ENCOUNTER — Encounter: Payer: Self-pay | Admitting: Physician Assistant

## 2017-08-03 VITALS — BP 100/64 | HR 62 | Resp 16 | Ht 70.0 in | Wt 236.0 lb

## 2017-08-03 DIAGNOSIS — I480 Paroxysmal atrial fibrillation: Secondary | ICD-10-CM | POA: Diagnosis not present

## 2017-08-03 DIAGNOSIS — G4733 Obstructive sleep apnea (adult) (pediatric): Secondary | ICD-10-CM | POA: Diagnosis not present

## 2017-08-03 DIAGNOSIS — R079 Chest pain, unspecified: Secondary | ICD-10-CM
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
## 2017-08-03 NOTE — Patient Instructions (Addendum)
Medication Instructions:  1. Your physician recommends that you continue on your current medications as directed. Please refer to the Current Medication list given to you today.   Labwork: NONE ORDERED TODAY  Testing/Procedures: 1. Your physician has requested that you have an echocardiogram. Echocardiography is a painless test that uses sound waves to create images of your heart. It provides your doctor with information about the size and shape of your heart and how well your heart's chambers and valves are working. This procedure takes approximately one hour. There are no restrictions for this procedure.    Follow-Up: DR. Percival Spanish 2 MONTHS   Any Other Special Instructions Will Be Listed Below (If Applicable).     If you need a refill on your cardiac medications before your next appointment, please call your pharmacy.

## 2017-08-05 ENCOUNTER — Ambulatory Visit (HOSPITAL_COMMUNITY): Payer: Medicare Other | Attending: Cardiology

## 2017-08-05 ENCOUNTER — Other Ambulatory Visit: Payer: Self-pay

## 2017-08-05 DIAGNOSIS — I480 Paroxysmal atrial fibrillation: Secondary | ICD-10-CM | POA: Diagnosis not present

## 2017-08-05 DIAGNOSIS — R079 Chest pain, unspecified: Secondary | ICD-10-CM | POA: Insufficient documentation

## 2017-08-05 DIAGNOSIS — Z87891 Personal history of nicotine dependence: Secondary | ICD-10-CM | POA: Diagnosis not present

## 2017-08-05 DIAGNOSIS — G4733 Obstructive sleep apnea (adult) (pediatric): Secondary | ICD-10-CM | POA: Insufficient documentation

## 2017-08-07 ENCOUNTER — Telehealth: Payer: Self-pay | Admitting: *Deleted

## 2017-08-07 DIAGNOSIS — I480 Paroxysmal atrial fibrillation: Secondary | ICD-10-CM

## 2017-08-07 NOTE — Telephone Encounter (Signed)
-----   Message from Imogene Burn, PA-C sent at 08/07/2017 10:00 AM EST ----- Heart function down to 40-45% on echo was 47% on stress test last year. Should have exercise myoview and earlier f/u with Dr. Percival Spanish in the next 2-3 weeks

## 2017-08-19 ENCOUNTER — Telehealth (HOSPITAL_COMMUNITY): Payer: Self-pay | Admitting: *Deleted

## 2017-08-19 NOTE — Telephone Encounter (Signed)
Patient's wife, per DPR, given detailed instructions per Myocardial Perfusion Study Information Sheet for the test on 08/21/17. Patient notified to arrive 15 minutes early and that it is imperative to arrive on time for appointment to keep from having the test rescheduled.  If you need to cancel or reschedule your appointment, please call the office within 24 hours of your appointment. . Patient verbalized understanding. Kirstie Peri

## 2017-08-21 ENCOUNTER — Ambulatory Visit (HOSPITAL_COMMUNITY): Payer: Medicare Other | Attending: Internal Medicine

## 2017-08-21 DIAGNOSIS — I480 Paroxysmal atrial fibrillation: Secondary | ICD-10-CM | POA: Diagnosis not present

## 2017-08-21 LAB — MYOCARDIAL PERFUSION IMAGING
CHL CUP NUCLEAR SDS: 3
CHL CUP NUCLEAR SRS: 1
CSEPPHR: 86 {beats}/min
LHR: 0.36
LV dias vol: 153 mL (ref 62–150)
LV sys vol: 81 mL
Rest HR: 45 {beats}/min
SSS: 4
TID: 1.01

## 2017-08-21 MED ORDER — REGADENOSON 0.4 MG/5ML IV SOLN
0.4000 mg | Freq: Once | INTRAVENOUS | Status: AC
  Administered 2017-08-21: 0.4 mg via INTRAVENOUS

## 2017-08-21 MED ORDER — TECHNETIUM TC 99M TETROFOSMIN IV KIT
30.5000 | PACK | Freq: Once | INTRAVENOUS | Status: AC | PRN
  Administered 2017-08-21: 30.5 via INTRAVENOUS
  Filled 2017-08-21: qty 31

## 2017-08-21 MED ORDER — TECHNETIUM TC 99M TETROFOSMIN IV KIT
10.7000 | PACK | Freq: Once | INTRAVENOUS | Status: AC | PRN
  Administered 2017-08-21: 10.7 via INTRAVENOUS
  Filled 2017-08-21: qty 11
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
## 2017-08-27 DIAGNOSIS — M50323 Other cervical disc degeneration at C6-C7 level: Secondary | ICD-10-CM | POA: Diagnosis not present

## 2017-08-28 ENCOUNTER — Ambulatory Visit: Payer: Medicare Other | Admitting: Cardiology

## 2017-09-10 ENCOUNTER — Emergency Department (HOSPITAL_COMMUNITY): Payer: Medicare Other

## 2017-09-10 ENCOUNTER — Emergency Department (HOSPITAL_COMMUNITY)
Admission: EM | Admit: 2017-09-10 | Discharge: 2017-09-11 | Disposition: A | Discharge status: Home / Self Care | Payer: Medicare Other | Attending: Emergency Medicine | Admitting: Emergency Medicine

## 2017-09-10 ENCOUNTER — Encounter (HOSPITAL_COMMUNITY): Payer: Self-pay | Admitting: Emergency Medicine

## 2017-09-10 DIAGNOSIS — R0789 Other chest pain: Secondary | ICD-10-CM | POA: Diagnosis not present

## 2017-09-10 DIAGNOSIS — R42 Dizziness and giddiness: Secondary | ICD-10-CM | POA: Diagnosis not present

## 2017-09-10 DIAGNOSIS — I480 Paroxysmal atrial fibrillation: Secondary | ICD-10-CM | POA: Insufficient documentation

## 2017-09-10 DIAGNOSIS — R079 Chest pain, unspecified: Secondary | ICD-10-CM | POA: Diagnosis not present

## 2017-09-10 DIAGNOSIS — Z87891 Personal history of nicotine dependence: Secondary | ICD-10-CM | POA: Diagnosis not present

## 2017-09-10 DIAGNOSIS — R0602 Shortness of breath: Secondary | ICD-10-CM | POA: Diagnosis not present

## 2017-09-10 LAB — BASIC METABOLIC PANEL
Anion gap: 10 (ref 5–15)
BUN: 19 mg/dL (ref 6–20)
CO2: 25 mmol/L (ref 22–32)
Calcium: 9.6 mg/dL (ref 8.9–10.3)
Chloride: 107 mmol/L (ref 101–111)
Creatinine, Ser: 1.01 mg/dL (ref 0.61–1.24)
Glucose, Bld: 100 mg/dL — ABNORMAL HIGH (ref 65–99)
POTASSIUM: 3.4 mmol/L — AB (ref 3.5–5.1)
SODIUM: 142 mmol/L (ref 135–145)

## 2017-09-10 LAB — CBC
HEMATOCRIT: 46.6 % (ref 39.0–52.0)
Hemoglobin: 15.1 g/dL (ref 13.0–17.0)
MCH: 30 pg (ref 26.0–34.0)
MCHC: 32.4 g/dL (ref 30.0–36.0)
MCV: 92.6 fL (ref 78.0–100.0)
PLATELETS: 184 10*3/uL (ref 150–400)
RBC: 5.03 MIL/uL (ref 4.22–5.81)
RDW: 13.7 % (ref 11.5–15.5)
WBC: 9.6 10*3/uL (ref 4.0–10.5)

## 2017-09-10 LAB — TROPONIN I: Troponin I: <0.03 ng/mL (ref <0.03)

## 2017-09-10 MED ORDER — APIXABAN 5 MG PO TABS
5.0000 mg | ORAL_TABLET | Freq: Once | ORAL | Status: AC
  Administered 2017-09-11: 5 mg via ORAL
  Filled 2017-09-10 (×2): qty 1

## 2017-09-10 NOTE — ED Notes (Signed)
EDP at bedside updating patient and family. Pt given Coke and peanut butter crackers per request.

## 2017-09-10 NOTE — ED Triage Notes (Signed)
Patient states he had chest pain, shortness of breath, and lightheadedness approximately 30 minutes prior to arrival to ER. States chest pain is better but "I still don't feel right."

## 2017-09-10 NOTE — ED Provider Notes (Signed)
Emergency Department Provider Note   I have reviewed the triage vital signs and the nursing notes.   HISTORY  Chief Complaint Chest Pain   HPI Joseph Mcdaniel is a 72 y.o. male without significant past medical history aside from paroxysmal atrial fibrillation is had twice in the last 8 years who presents emerged from today with chest pain.  Patient states that he had acute onset of left-sided chest pressure that was mild in nature.  It felt similar to previous episodes of atrial fibrillation but not as bad.  His wife took his vital signs and his blood pressure was high and his heart rate was also high and very red.  Because of this he decided to come the emergency department and then on the way here patient states that he felt his chest discomfort go away and on arrival here he was in a sinus rhythm.  He is not on any anticoagulation or antiarrhythmics.  He has some associated shortness of breath and lightheadedness with it but is symptomatic at this time.  Sees Dr. Percival Spanish in about a week. No other associated or modifying symptoms.    Past Medical History:  Diagnosis Date  . A-fib [facility])    Recognized 08/2012- spont converted to NSR  . Adenomatous colon polyp 09/2007  . Anxiety   . Cataract    bilateral  . DDD (degenerative disc disease)   . ED (erectile dysfunction)   . Fibromyalgia   . Generalized OA   . GERD (gastroesophageal reflux disease)   . Hx of cardiac catheterization    cardiac CT 5/11: Ca score 0, normal cors  . Hx of echocardiogram    a. Echocardiogram 08/30/12: Normal LV wall thickness, EF 60%, normal wall motion, normal RV systolic function  . Myalgia and myositis   . OSA (obstructive sleep apnea)    Uses CPAP machine  . Sleep apnea    wears cpap  . Vitamin D deficiency     Patient Active Problem List   Diagnosis Date Noted  . PAF (paroxysmal atrial fibrillation) ([place]) 02/13/2016  . Cough 01/09/2015  . Dyspnea 01/09/2015  . Wheeze 01/09/2015  .  Post-nasal drainage 01/09/2015  . Exposure to smoke from industrial source 01/09/2015  . Cannabis dependence, episodic use ([place]) 08/31/2012  . H/O adenomatous polyp of colon 10/29/2011  . SHORTNESS OF BREATH 12/27/2009  . CHEST PAIN 12/03/2009  . ALLERGIC RHINITIS 04/03/2008  . BRONCHITIS 04/03/2008  . Obstructive sleep apnea 04/03/2008    Past Surgical History:  Procedure Laterality Date  . COLONOSCOPY    . Lipoma from back    . LUMBAR LAMINECTOMY    . POLYPECTOMY    . Rotator cuff tear and repair Right     Current Outpatient Rx  . Order #: [id number] Class: Historical Med  . Order #: [id number] Class: Historical Med  . Order #: [id number] Class: Historical Med  . Order #: [id number] Class: Historical Med  . Order #: [id number] Class: Historical Med  . Order #: [id number] Class: Print    Allergies Codeine; Hydrocodone; Omeprazole; and Penicillins  Family History  Problem Relation Age of Onset  . Breast cancer Mother        Metastatic  . Diabetes Maternal Aunt   . Heart attack Father        Died at age 69  . Colon cancer Neg Hx   . Colon polyps Neg Hx   . Rectal cancer Neg Hx   . Stomach cancer Neg Hx   . Esophageal cancer  Neg Hx     Social History Social History   Tobacco Use  . Smoking status: Former Smoker    Packs/day: 1.50    Years: 30.00    Pack years: 45.00    Types: Cigarettes    Last attempt to quit: 09/08/1994    Years since quitting: 23.0  . Smokeless tobacco: Never Used  . Tobacco comment: Quit smoking 1996, smoked for 30 years, pt smoke 3 packs per day for 1-2 years.   Substance Use Topics  . Alcohol use: Yes    Alcohol/week: 1.8 oz    Types: 3 Cans of beer per week    Comment: occ beer on weekends  . Drug use: No    Review of Systems  All other systems negative except as documented in the HPI. All pertinent positives and negatives as reviewed in the HPI. ____________________________________________   PHYSICAL EXAM:  VITAL SIGNS: ED Triage Vitals   Enc Vitals Group     BP 09/10/17 1844 (!) 140/93     Pulse Rate 09/10/17 1844 63     Resp 09/10/17 1844 20     Temp 09/10/17 1844 98.3 F (36.8 C)     Temp Source 09/10/17 1844 Oral     SpO2 09/10/17 1844 97 %     Weight 09/10/17 1842 234 lb (106.1 kg)     Height 09/10/17 1842 5\' 10"  (1.778 m)    Constitutional: Alert and oriented. Well appearing and in no acute distress. Eyes: Conjunctivae are normal. PERRL. EOMI. Head: Atraumatic. Nose: No congestion/rhinnorhea. Mouth/Throat: Mucous membranes are moist.  Oropharynx non-erythematous. Neck: No stridor.  No meningeal signs.   Cardiovascular: Normal rate, sinus bradycardia. Good peripheral circulation. Grossly normal heart sounds.   Respiratory: Normal respiratory effort.  No retractions. Lungs CTAB. Gastrointestinal: Soft and nontender. No distention.  Musculoskeletal: No lower extremity tenderness nor edema. No gross deformities of extremities. Neurologic:  Normal speech and language. No gross focal neurologic deficits are appreciated.  Skin:  Skin is warm, dry and intact. No rash noted.  ____________________________________________   LABS (all labs ordered are listed, but only abnormal results are displayed)  Labs Reviewed  BASIC METABOLIC PANEL - Abnormal; Notable for the following components:      Result Value   Potassium 3.4 (*)    Glucose, Bld 100 (*)    All other components within normal limits  CBC  TROPONIN I  TROPONIN I   ____________________________________________  EKG   EKG Interpretation  Date/Time:  Thursday September 10 2017 18:48:18 EST Ventricular Rate:  61 PR Interval:  158 QRS Duration: 106 QT Interval:  392 QTC Calculation: 394 R Axis:   -12 Text Interpretation:  Normal sinus rhythm with sinus arrhythmia Normal ECG No significant change since last tracing Confirmed by Orlie Dakin [phone]) on 09/10/2017 6:57:14 PM       ____________________________________________  RADIOLOGY  Dg Chest  2 View  Result Date: 09/10/2017 CLINICAL DATA:  Chest pain diffusely. EXAM: CHEST  2 VIEW COMPARISON:  01/09/2015 FINDINGS: Eventration of the right diaphragm. There is no edema, consolidation, effusion, or pneumothorax. Normal heart size and mediastinal contours. No osseous abnormality. IMPRESSION: No evidence of active disease.  Stable from 2016. Electronically Signed   By: Monte Fantasia M.D.   On: 09/10/2017 19:27    ____________________________________________   PROCEDURES  Procedure(s) performed:   Procedures   ____________________________________________   INITIAL IMPRESSION / ASSESSMENT AND PLAN / ED COURSE Troponin negative and his resting heart rate is in the  74s.  Patient likely needs to be started on coagulation of some sort.  We will likely get a repeat troponin at 11:00 to ensure no evidence of heart damage however this will likely be negative as I think his symptoms are related to the paroxysmal atrial fibrillation.  He has appointment with his cardiologist next week however I will discussed with cardiology on-call now about appropriate management in the interim.    CHA2DS2/VAS Stroke Risk Points      1 >= 2 Points: High Risk  1 - 1.99 Points: Medium Risk  0 Points: Low Risk    The patient's score has not changed in the past year.:  No Change     Details    This score determines the patient's risk of having a stroke if the  patient has atrial fibrillation.       Points Metrics  0 Has Congestive Heart Failure:  No   0 Has Vascular Disease:  No   0 Has Hypertension:  No   1 Age:  12   0 Has Diabetes:  No   0 Had Stroke:  No  Had TIA:  No  Had thromboembolism:  No   0 Male:  No       CHADS vasc of 1.      Delta troponins negative. Discussed with Dr. Marquette Old with Cardiology who suggests starting Eliquis. Will fu w/ Dr. Percival Spanish for further management. Hold on rate/rhythm control medications for time being.      Pertinent labs & imaging results that  were available during my care of the patient were reviewed by me and considered in my medical decision making (see chart for details).  ____________________________________________  FINAL CLINICAL IMPRESSION(S) / ED DIAGNOSES  Final diagnoses:  Paroxysmal atrial fibrillation ([place])     MEDICATIONS GIVEN DURING THIS VISIT:  Medications  apixaban (ELIQUIS) tablet 5 mg (5 mg Oral Given 09/11/17 0059)     NEW OUTPATIENT MEDICATIONS STARTED DURING THIS VISIT:  This SmartLink is deprecated. Use AVSMEDLIST instead to display the medication list for a patient.  Note:  This note was prepared with assistance of Dragon voice recognition software. Occasional wrong-word or sound-a-like substitutions may have occurred due to the inherent limitations of voice recognition software.   Jennifier Smitherman, Corene Cornea, MD 09/11/17 [phone]

## 2017-09-11 LAB — TROPONIN I

## 2017-09-11 MED ORDER — APIXABAN 5 MG PO TABS: 5.0000 mg | ORAL_TABLET | Freq: Two times a day (BID) | ORAL | 0 refills | Status: DC

## 2017-09-19 DIAGNOSIS — S41112A Laceration without foreign body of left upper arm, initial encounter: Secondary | ICD-10-CM | POA: Diagnosis not present

## 2017-09-23 DIAGNOSIS — L309 Dermatitis, unspecified: Secondary | ICD-10-CM | POA: Diagnosis not present

## 2017-09-23 DIAGNOSIS — D692 Other nonthrombocytopenic purpura: Secondary | ICD-10-CM | POA: Diagnosis not present

## 2017-09-23 DIAGNOSIS — L570 Actinic keratosis: Secondary | ICD-10-CM | POA: Diagnosis not present

## 2017-09-23 NOTE — Progress Notes (Signed)
Cardiology Office Note   Date:  09/25/2017   ID:  Joseph Mcdaniel, DOB 12-06-1945, MRN [id number]  PCP:  Thressa Sheller, MD  Cardiologist:   Minus Breeding, MD   Chief Complaint  Patient presents with  . Follow-up      History of Present Illness: Joseph Mcdaniel is a 72 y.o. male who has been evaluated for atrial fib.  Joseph Mcdaniel was hospitalized and spontaneously converted to normal sinus rhythm.   Joseph Mcdaniel has a history of normal coronary arteries and calcium score of 0 by cardiac CT in 2011.  Joseph Mcdaniel was seen recently and had a negative Lexiscan Myoview for ischemia to evaluate chest pain.  Joseph Mcdaniel was in the ED about 2 week ago with palpitations.  Joseph Mcdaniel felt like Joseph Mcdaniel was in atrial fibrillation.  Joseph Mcdaniel has not felt this since 2013.  Joseph Mcdaniel felt Joseph Mcdaniel heart going fast and Joseph Mcdaniel had some chest pressure.  However, by the time Joseph Mcdaniel went to the emergency room Joseph Mcdaniel was in sinus. I reviewed these records for this visit.    Joseph Mcdaniel had negative troponins.  Despite a Joseph Mcdaniel has a CHA2DS2 - VASc score of 1 Joseph Mcdaniel was started on Eliquis.  Joseph Mcdaniel actually has not taken any of this.  Joseph Mcdaniel denies any further palpitations.  Joseph Mcdaniel otherwise denies chest discomfort, neck or arm discomfort.  Joseph Mcdaniel has had no new shortness of breath, PND or orthopnea.  Joseph Mcdaniel is active in Joseph Mcdaniel job and has stress with this.  Joseph Mcdaniel works at [employer].  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     Past Medical History:  Diagnosis Date  . A-fib [facility])    Recognized 08/2012- spont converted to NSR  . Adenomatous colon polyp 09/2007  . Anxiety   . Cataract    bilateral  . DDD (degenerative disc disease)   . ED (erectile dysfunction)   . Fibromyalgia   . Generalized OA   . GERD (gastroesophageal reflux disease)   . Hx of cardiac catheterization    cardiac CT 5/11: Ca score 0, normal cors  . Hx of echocardiogram    a. Echocardiogram 08/30/12: Normal LV wall thickness, EF 60%, normal wall motion, normal RV systolic function  . Myalgia and  myositis   . OSA (obstructive sleep apnea)    Uses CPAP machine  . Sleep apnea    wears cpap  . Vitamin D deficiency     Past Surgical History:  Procedure Laterality Date  . COLONOSCOPY    . Lipoma from back    . LUMBAR LAMINECTOMY    . POLYPECTOMY    . Rotator cuff tear and repair Right      Current Outpatient Medications  Medication Sig Dispense Refill  . apixaban (ELIQUIS) 5 MG TABS tablet Take 1 tablet (5 mg total) by mouth 2 (two) times daily. 60 tablet 0  . cholecalciferol (VITAMIN D) 1000 UNITS tablet Take 1,000 Units by mouth daily.    . fish oil-omega-3 fatty acids 1000 MG capsule Take 2 g by mouth daily.    . Multiple Vitamin (MULTIVITAMIN WITH MINERALS) TABS tablet Take 1 tablet by mouth daily.    . Probiotic Product (ALIGN) 4 MG CAPS Take 1 capsule by mouth daily.    . tadalafil (CIALIS) 5 MG tablet Take 5 mg by mouth daily as needed. Erectile dysfunction     No current facility-administered medications for this visit.     Allergies:   Codeine; Hydrocodone; Omeprazole;  and Penicillins    ROS:  Please see the history of present illness.   Otherwise, review of systems are positive for none.   All other systems are reviewed and negative.    PHYSICAL EXAM: VS:  BP 112/64   Pulse 60   Ht 5\' 10"  (1.778 m)   Wt 236 lb 9.6 oz (107.3 kg)   BMI 33.95 kg/m  , BMI Body mass index is 33.95 kg/m. GENERAL:  Well appearing NECK:  No jugular venous distention, waveform within normal limits, carotid upstroke brisk and symmetric, no bruits, no thyromegaly LUNGS:  Clear to auscultation bilaterally BACK:  No CVA tenderness CHEST:  Unremarkable HEART:  PMI not displaced or sustained,S1 and S2 within normal limits, no S3, no S4, no clicks, no rubs, no murmurs ABD:  Flat, positive bowel sounds normal in frequency in pitch, no bruits, no rebound, no guarding, no midline pulsatile mass, no hepatomegaly, no splenomegaly EXT:  2 plus pulses throughout, no edema, no cyanosis no  clubbing   EKG:  EKG is not ordered today.    Recent Labs: 09/10/2017: BUN 19; Creatinine, Ser 1.01; Hemoglobin 15.1; Platelets 184; Potassium 3.4; Sodium 142    Lipid Panel No results found for: CHOL, TRIG, HDL, CHOLHDL, VLDL, LDLCALC, LDLDIRECT    Wt Readings from Last 3 Encounters:  09/25/17 236 lb 9.6 oz (107.3 kg)  09/10/17 234 lb (106.1 kg)  08/03/17 236 lb (107 kg)      Other studies Reviewed: Additional studies/ records that were reviewed today include: ED records. Review of the above records demonstrates:  Please see elsewhere in the note.     ASSESSMENT AND PLAN:   CHEST PAIN: This was atypical and is no longer occurring.  No further workup is planned.  ATRIAL FIB:    Joseph Mcdaniel does not have significant cardiovascular risk and does not have indication for anticoagulation.  At this point Joseph Mcdaniel paroxysms are so infrequent that I do not think further medical therapy for prevention is indicated but Joseph Mcdaniel will let me know if Joseph Mcdaniel has increasing recurrences of this.  At that point Joseph Mcdaniel might need further medical therapy such as flecainide.  I will order aTSH.    OBESITY :  We talked about portion control.   C1  OBSTRUCTIVE SLEEP APNEA:  Joseph Mcdaniel continues with CPAP.    Current medicines are reviewed at length with the patient today.  The patient does not have concerns regarding medicines.  The following changes have been made:  no change  Labs/ tests ordered today include: None No orders of the defined types were placed in this encounter.    Disposition:   FU with Estella Husk PAc in 6 months.      Signed, Minus Breeding, MD  09/25/2017 9:45 AM    [place]

## 2017-09-25 ENCOUNTER — Encounter: Payer: Self-pay | Admitting: Cardiology

## 2017-09-25 ENCOUNTER — Ambulatory Visit: Payer: Medicare Other | Admitting: Cardiology

## 2017-09-25 VITALS — BP 112/64 | HR 60 | Ht 70.0 in | Wt 236.6 lb

## 2017-09-25 DIAGNOSIS — I480 Paroxysmal atrial fibrillation: Secondary | ICD-10-CM | POA: Diagnosis not present

## 2017-09-25 DIAGNOSIS — R5383 Other fatigue: Secondary | ICD-10-CM

## 2017-09-25 NOTE — Patient Instructions (Addendum)
Medication Instructions:  Continue current medications  If you need a refill on your cardiac medications before your next appointment, please call your pharmacy.  Labwork: TSH  Testing/Procedures: None Ordered  Follow-Up: Your physician wants you to follow-up in: 6 Months with Joseph Mcdaniel. You should receive a reminder letter in the mail two months in advance. If you do not receive a letter, please call our office [phone].    Thank you for choosing [facility] at [facility]!!

## 2017-09-26 LAB — TSH: TSH: 1.56 u[IU]/mL (ref 0.450–4.500)

## 2017-10-07 ENCOUNTER — Ambulatory Visit: Payer: Medicare Other | Admitting: Cardiology

## 2017-10-07 DIAGNOSIS — H2513 Age-related nuclear cataract, bilateral: Secondary | ICD-10-CM | POA: Diagnosis not present

## 2017-10-07 DIAGNOSIS — L739 Follicular disorder, unspecified: Secondary | ICD-10-CM | POA: Diagnosis not present

## 2017-10-08 ENCOUNTER — Ambulatory Visit: Payer: Medicare Other | Admitting: Cardiology

## 2017-10-16 DIAGNOSIS — G4733 Obstructive sleep apnea (adult) (pediatric): Secondary | ICD-10-CM | POA: Diagnosis not present

## 2017-11-09 DIAGNOSIS — M542 Cervicalgia: Secondary | ICD-10-CM | POA: Diagnosis not present

## 2017-12-28 NOTE — Progress Notes (Signed)
Joseph Mcdaniel is a 72 y.o. male is here for TO ESTABLISH CARE.  History of Present Illness:   Shaune Pascal CMA acting as scribe for Dr. Juleen China.  HPI: Patient comes in today to establish care with Dr. Juleen China. See Assessment and Plan section for Problem Based Charting of issues discussed today.   Health Maintenance Due  Topic Date Due  . Hepatitis C Screening  11-28-1945  . PNA vac Low Risk Adult (2 of 2 - PCV13) 05/10/2015   Depression screen PHQ 2/9 12/29/2017  Decreased Interest 0  Down, Depressed, Hopeless 0  PHQ - 2 Score 0   PMHx, SurgHx, SocialHx, FamHx, Medications, and Allergies were reviewed in the Visit Navigator and updated as appropriate.   Patient Active Problem List   Diagnosis Date Noted  . Essential hypertension 01/05/2018  . PAF (paroxysmal atrial fibrillation) ([place]) 02/13/2016  . Exposure to smoke from industrial source 01/09/2015  . Cannabis dependence, episodic use ([place]) 08/31/2012  . H/O adenomatous polyp of colon 10/29/2011  . Obstructive sleep apnea 04/03/2008   Social History   Tobacco Use  . Smoking status: Former Smoker    Packs/day: 1.50    Years: 30.00    Pack years: 45.00    Types: Cigarettes    Last attempt to quit: 09/08/1994    Years since quitting: 23.3  . Smokeless tobacco: Never Used  . Tobacco comment: Quit smoking 1996, smoked for 30 years, pt smoke 3 packs per day for 1-2 years.   Substance Use Topics  . Alcohol use: Yes    Alcohol/week: 1.8 oz    Types: 3 Cans of beer per week    Comment: occ beer on weekends  . Drug use: No   Current Medications and Allergies:   Current Outpatient Medications:  .  cholecalciferol (VITAMIN D) 1000 UNITS tablet, Take 1,000 Units by mouth daily., Disp: , Rfl:  .  fish oil-omega-3 fatty acids 1000 MG capsule, Take 2 g by mouth daily., Disp: , Rfl:  .  Multiple Vitamin (MULTIVITAMIN WITH MINERALS) TABS tablet, Take 1 tablet by mouth daily., Disp: , Rfl:  .  Probiotic Product (ALIGN) 4 MG  CAPS, Take 1 capsule by mouth daily., Disp: , Rfl:  .  tadalafil (CIALIS) 5 MG tablet, Take 5 mg by mouth daily as needed. Erectile dysfunction, Disp: , Rfl:    Allergies  Allergen Reactions  . Codeine Itching  . Hydrocodone Itching  . Omeprazole     Reaction unknown  . Penicillins Hives and Itching    Has patient had a PCN reaction causing immediate rash, facial/tongue/throat swelling, SOB or lightheadedness with hypotension: Yes Has patient had a PCN reaction causing severe rash involving mucus membranes or skin necrosis: No Has patient had a PCN reaction that required hospitalization: No Has patient had a PCN reaction occurring within the last 10 years: No If all of the above answers are "NO", then may proceed with Cephalosporin use.    Review of Systems   Pertinent items are noted in the HPI. Otherwise, ROS is negative.  Vitals:   Vitals:   12/29/17 1453  BP: 108/70  Pulse: 65  Temp: 98 F (36.7 C)  TempSrc: Oral  SpO2: 96%  Weight: 227 lb 9.6 oz (103.2 kg)  Height: 5\' 10"  (1.778 m)     Body mass index is 32.66 kg/m.   Physical Exam:   Physical Exam  Constitutional: He is oriented to person, place, and time. He appears well-developed and well-nourished. No  distress.  HENT:  Head: Normocephalic and atraumatic.  Right Ear: External ear normal.  Left Ear: External ear normal.  Nose: Nose normal.  Mouth/Throat: Oropharynx is clear and moist.  Eyes: Pupils are equal, round, and reactive to light. Conjunctivae and EOM are normal.  Neck: Normal range of motion. Neck supple.  Cardiovascular: Normal rate, regular rhythm, normal heart sounds and intact distal pulses.  Pulmonary/Chest: Effort normal and breath sounds normal.  Abdominal: Soft. Bowel sounds are normal.  Musculoskeletal: Normal range of motion.  Neurological: He is alert and oriented to person, place, and time.  Skin: Skin is warm and dry.  Psychiatric: He has a normal mood and affect. His behavior is  normal. Judgment and thought content normal.  Nursing note and vitals reviewed.    Results for orders placed or performed in visit on 12/29/17  CBC with Differential/Platelet  Result Value Ref Range   WBC 7.7 4.0 - 10.5 K/uL   RBC 4.79 4.22 - 5.81 Mil/uL   Hemoglobin 14.7 13.0 - 17.0 g/dL   HCT 43.3 39.0 - 52.0 %   MCV 90.4 78.0 - 100.0 fl   MCHC 33.9 30.0 - 36.0 g/dL   RDW 14.0 11.5 - 15.5 %   Platelets 183.0 150.0 - 400.0 K/uL   Neutrophils Relative % 64.6 43.0 - 77.0 %   Lymphocytes Relative 26.1 12.0 - 46.0 %   Monocytes Relative 6.2 3.0 - 12.0 %   Eosinophils Relative 2.4 0.0 - 5.0 %   Basophils Relative 0.7 0.0 - 3.0 %   Neutro Abs 5.0 1.4 - 7.7 K/uL   Lymphs Abs 2.0 0.7 - 4.0 K/uL   Monocytes Absolute 0.5 0.1 - 1.0 K/uL   Eosinophils Absolute 0.2 0.0 - 0.7 K/uL   Basophils Absolute 0.1 0.0 - 0.1 K/uL  Comprehensive metabolic panel  Result Value Ref Range   Sodium 139 135 - 145 mEq/L   Potassium 3.3 (L) 3.5 - 5.1 mEq/L   Chloride 106 96 - 112 mEq/L   CO2 27 19 - 32 mEq/L   Glucose, Bld 121 (H) 70 - 99 mg/dL   BUN 23 6 - 23 mg/dL   Creatinine, Ser 1.06 0.40 - 1.50 mg/dL   Total Bilirubin 0.5 0.2 - 1.2 mg/dL   Alkaline Phosphatase 58 39 - 117 U/L   AST 26 0 - 37 U/L   ALT 24 0 - 53 U/L   Total Protein 7.3 6.0 - 8.3 g/dL   Albumin 4.2 3.5 - 5.2 g/dL   Calcium 9.0 8.4 - 10.5 mg/dL   GFR 73.15 >60.00 mL/min  Lipid panel  Result Value Ref Range   Cholesterol 168 0 - 200 mg/dL   Triglycerides 125.0 0.0 - 149.0 mg/dL   HDL 39.90 >39.00 mg/dL   VLDL 25.0 0.0 - 40.0 mg/dL   LDL Cholesterol 103 (H) 0 - 99 mg/dL   Total CHOL/HDL Ratio 4    NonHDL 128.11    Assessment and Plan:   Diagnoses and all orders for this visit:  Essential hypertension -     CBC with Differential/Platelet -     Comprehensive metabolic panel  PAF (paroxysmal atrial fibrillation) (H[facility]) Comments: Two episodes in a few years.  Hypokalemia  Pure hypercholesterolemia Comments: Declines  statin. Orders: -     Lipid panel  Obesity (BMI 30.0-34.9)   . Reviewed expectations re: course of current medical issues. . Discussed self-management of symptoms. . Outlined signs and symptoms indicating need for more acute intervention. . Patient verbalized  understanding and all questions were answered. Marland Kitchen Health Maintenance issues including appropriate healthy diet, exercise, and smoking avoidance were discussed with patient. . See orders for this visit as documented in the electronic medical record. . Patient received an After Visit Summary.  Briscoe Deutscher, DO [place] [place] 01/05/2018  Future Appointments  Date Time Provider [place]  01/04/2019  4:00 PM Briscoe Deutscher, DO [facility]

## 2017-12-29 ENCOUNTER — Ambulatory Visit (INDEPENDENT_AMBULATORY_CARE_PROVIDER_SITE_OTHER): Payer: Medicare Other | Admitting: Family Medicine

## 2017-12-29 ENCOUNTER — Encounter: Payer: Self-pay | Admitting: Family Medicine

## 2017-12-29 VITALS — BP 108/70 | HR 65 | Temp 98.0°F | Ht 70.0 in | Wt 227.6 lb

## 2017-12-29 DIAGNOSIS — I10 Essential (primary) hypertension: Secondary | ICD-10-CM

## 2017-12-29 DIAGNOSIS — E876 Hypokalemia: Secondary | ICD-10-CM | POA: Diagnosis not present

## 2017-12-29 DIAGNOSIS — E7800 Pure hypercholesterolemia, unspecified: Secondary | ICD-10-CM | POA: Diagnosis not present

## 2017-12-29 DIAGNOSIS — E669 Obesity, unspecified: Secondary | ICD-10-CM | POA: Diagnosis not present

## 2017-12-29 DIAGNOSIS — I480 Paroxysmal atrial fibrillation: Secondary | ICD-10-CM | POA: Diagnosis not present

## 2017-12-30 LAB — CBC WITH DIFFERENTIAL/PLATELET
Basophils Absolute: 0.1 10*3/uL (ref 0.0–0.1)
Basophils Relative: 0.7 % (ref 0.0–3.0)
Eosinophils Absolute: 0.2 10*3/uL (ref 0.0–0.7)
Eosinophils Relative: 2.4 % (ref 0.0–5.0)
HCT: 43.3 % (ref 39.0–52.0)
Hemoglobin: 14.7 g/dL (ref 13.0–17.0)
Lymphocytes Relative: 26.1 % (ref 12.0–46.0)
Lymphs Abs: 2 10*3/uL (ref 0.7–4.0)
MCHC: 33.9 g/dL (ref 30.0–36.0)
MCV: 90.4 fl (ref 78.0–100.0)
Monocytes Absolute: 0.5 10*3/uL (ref 0.1–1.0)
Monocytes Relative: 6.2 % (ref 3.0–12.0)
Neutro Abs: 5 10*3/uL (ref 1.4–7.7)
Neutrophils Relative %: 64.6 % (ref 43.0–77.0)
Platelets: 183 10*3/uL (ref 150.0–400.0)
RBC: 4.79 Mil/uL (ref 4.22–5.81)
RDW: 14 % (ref 11.5–15.5)
WBC: 7.7 10*3/uL (ref 4.0–10.5)

## 2017-12-30 LAB — LIPID PANEL
Cholesterol: 168 mg/dL (ref 0–200)
HDL: 39.9 mg/dL (ref >39.00)
LDL Cholesterol: 103 mg/dL — ABNORMAL HIGH (ref 0–99)
NonHDL: 128.11
Total CHOL/HDL Ratio: 4
Triglycerides: 125 mg/dL (ref 0.0–149.0)
VLDL: 25 mg/dL (ref 0.0–40.0)

## 2017-12-30 LAB — COMPREHENSIVE METABOLIC PANEL
ALT: 24 U/L (ref 0–53)
AST: 26 U/L (ref 0–37)
Albumin: 4.2 g/dL (ref 3.5–5.2)
Alkaline Phosphatase: 58 U/L (ref 39–117)
BUN: 23 mg/dL (ref 6–23)
CO2: 27 mEq/L (ref 19–32)
Calcium: 9 mg/dL (ref 8.4–10.5)
Chloride: 106 mEq/L (ref 96–112)
Creatinine, Ser: 1.06 mg/dL (ref 0.40–1.50)
GFR: 73.15 mL/min (ref >60.00)
Glucose, Bld: 121 mg/dL — ABNORMAL HIGH (ref 70–99)
Potassium: 3.3 mEq/L — ABNORMAL LOW (ref 3.5–5.1)
Sodium: 139 mEq/L (ref 135–145)
Total Bilirubin: 0.5 mg/dL (ref 0.2–1.2)
Total Protein: 7.3 g/dL (ref 6.0–8.3)

## 2018-01-05 DIAGNOSIS — I10 Essential (primary) hypertension: Secondary | ICD-10-CM | POA: Insufficient documentation

## 2018-01-21 DIAGNOSIS — G4733 Obstructive sleep apnea (adult) (pediatric): Secondary | ICD-10-CM | POA: Diagnosis not present

## 2018-02-10 DIAGNOSIS — M1711 Unilateral primary osteoarthritis, right knee: Secondary | ICD-10-CM | POA: Diagnosis not present

## 2018-02-10 DIAGNOSIS — M1712 Unilateral primary osteoarthritis, left knee: Secondary | ICD-10-CM | POA: Diagnosis not present

## 2018-02-17 DIAGNOSIS — M1712 Unilateral primary osteoarthritis, left knee: Secondary | ICD-10-CM | POA: Diagnosis not present

## 2018-02-17 DIAGNOSIS — M1711 Unilateral primary osteoarthritis, right knee: Secondary | ICD-10-CM | POA: Diagnosis not present

## 2018-02-20 DIAGNOSIS — M1711 Unilateral primary osteoarthritis, right knee: Secondary | ICD-10-CM | POA: Diagnosis not present

## 2018-02-20 DIAGNOSIS — M1712 Unilateral primary osteoarthritis, left knee: Secondary | ICD-10-CM | POA: Diagnosis not present

## 2018-02-24 DIAGNOSIS — M25562 Pain in left knee: Secondary | ICD-10-CM | POA: Diagnosis not present

## 2018-02-27 DIAGNOSIS — M1712 Unilateral primary osteoarthritis, left knee: Secondary | ICD-10-CM | POA: Diagnosis not present

## 2018-03-01 ENCOUNTER — Encounter: Payer: Self-pay | Admitting: Family Medicine

## 2018-03-01 ENCOUNTER — Encounter: Payer: Self-pay | Admitting: Physician Assistant

## 2018-03-01 DIAGNOSIS — M1712 Unilateral primary osteoarthritis, left knee: Secondary | ICD-10-CM | POA: Diagnosis not present

## 2018-03-01 DIAGNOSIS — S83282A Other tear of lateral meniscus, current injury, left knee, initial encounter: Secondary | ICD-10-CM | POA: Diagnosis not present

## 2018-03-02 ENCOUNTER — Encounter: Payer: Self-pay | Admitting: Family Medicine

## 2018-03-02 ENCOUNTER — Telehealth: Payer: Self-pay

## 2018-03-02 NOTE — Telephone Encounter (Signed)
   [facility] Pre-operative Risk Assessment    Request for surgical clearance:  1. What type of surgery is being performed? LEFT KNEE ARTHROSCOPY MENISCECTOMY CHONDROPLASTY     2. When is this surgery scheduled? TBD   3. What type of clearance is required (medical clearance vs. Pharmacy clearance to hold med vs. Both)?  MEDICAL  4. Are there any medications that need to be held prior to surgery and how long? NOT LISTED   5. Practice name and name of physician performing surgery?   [facility] [phone]  6. What is your office phone number [phone]    7.   What is your office fax number [phone]  8.   Anesthesia type (None, local, MAC, general)?  GENERAL & KNEE BLOCK   Waylan Rocher 03/02/2018, 3:42 PM  _________________________________________________________________   (provider comments below)

## 2018-03-02 NOTE — Progress Notes (Signed)
Subjective:    Joseph Mcdaniel is a 72 y.o. male who presents to the office today for a preoperative consultation at the request of surgeon DR. Marietta who plans on performing A LEFT KNEE ARTHROSCOPY MENISCECTOMY CHONDROPLASTY on ASAP. Planned anesthesia is GENERAL AND KNEE BLOCK. The patient has the following known anesthesia issues: NONE. Patient has a bleeding risk of: no recent abnormal bleeding, no remote history of abnormal bleeding and no use of Ca-channel blockers. Patient DOES NOT have objections to receiving blood products if needed.  Current Outpatient Medications:  .  aspirin (ASPIRIN 81) 81 MG chewable tablet, Aspir-81, Disp: , Rfl:  .  cholecalciferol (VITAMIN D) 1000 UNITS tablet, Take 1,000 Units by mouth daily., Disp: , Rfl:  .  fish oil-omega-3 fatty acids 1000 MG capsule, Take 2 g by mouth daily., Disp: , Rfl:  .  Multiple Vitamin (MULTIVITAMIN WITH MINERALS) TABS tablet, Take 1 tablet by mouth daily., Disp: , Rfl:  .  tadalafil (CIALIS) 5 MG tablet, Take 5 mg by mouth daily as needed. Erectile dysfunction, Disp: , Rfl:  .  triamcinolone cream (KENALOG) 0.1 %, triamcinolone acetonide 0.1 % topical cream, Disp: , Rfl:   Patient Active Problem List   Diagnosis Date Noted  . Osteoarthritis of both knees 03/03/2018  . Essential hypertension 01/05/2018  . PAF (paroxysmal atrial fibrillation) ([place]) 02/13/2016  . Exposure to smoke from industrial source 01/09/2015  . Cannabis dependence, episodic use ([place]) 08/31/2012  . H/O adenomatous polyp of colon 10/29/2011  . Obstructive sleep apnea 04/03/2008    Past Surgical History:  Procedure Laterality Date  . COLONOSCOPY    . LIPOMA EXCISION    . LUMBAR LAMINECTOMY    . POLYPECTOMY    . ROTATOR CUFF REPAIR Right    Social History   Socioeconomic History  . Marital status: Married    Spouse name: Not on file  . Number of children: 4  . Years of education: Not on file  . Highest education level:  Not on file  Occupational History  . Occupation: Retired    Comment: PT @ [employer]  Tobacco Use  . Smoking status: Former Smoker    Packs/day: 1.50    Years: 30.00    Pack years: 45.00    Types: Cigarettes    Last attempt to quit: 09/08/1994    Years since quitting: 23.4  . Smokeless tobacco: Never Used  . Tobacco comment: Quit smoking 1996, smoked for 30 years, pt smoke 3 packs per day for 1-2 years.   Substance and Sexual Activity  . Alcohol use: Yes    Alcohol/week: 1.8 oz    Types: 3 Cans of beer per week    Comment: occ beer on weekends  . Drug use: No  . Sexual activity: Not on file  Social History Narrative   Patient disabled due to multiple orthopedic problems.    The following portions of the patient's history were reviewed and updated as appropriate: allergies, current medications, past family history, past medical history, past social history, past surgical history and problem list.  Review of Systems Pertinent items noted in HPI and remainder of comprehensive ROS otherwise negative.    Objective:   Today's Vitals   03/03/18 1029  BP: 130/78  Pulse: (!) 56  Temp: 97.6 F (36.4 C)  TempSrc: Oral  SpO2: 97%  Weight: 230 lb 3.2 oz (104.4 kg)  Height: 5\' 10"  (1.778 m)    Physical Exam General appearance: alert,  cooperative and appears stated age. Head: Normocephalic, without obvious abnormality, atraumatic. Eyes: conjunctivae/corneas clear. PERRL, EOM's intact. Fundi benign. Nose: Nares normal. No drainage or sinus tenderness. Lungs: Clear to auscultation bilaterally. Heart: regular rate and rhythm. Abdomen: soft, non-tender; bowel sounds normal; no masses,  no organomegaly. Extremities: extremities normal, atraumatic, no cyanosis or edema. Skin: Skin color, texture, turgor normal. No rashes or lesions.  Cardiographics ECG: Bradycardia, no ST/T changes Myocardial perfusion: The study is normal. This is a low risk study. Overall left ventricular systolic  function was normal. Nuclear stress EF: 47%. 08/21/17  Imaging Chest x-ray: WNL on 09/10/17   Lab Review  Results for orders placed or performed in visit on 12/29/17  CBC with Differential/Platelet  Result Value Ref Range   WBC 7.7 4.0 - 10.5 K/uL   RBC 4.79 4.22 - 5.81 Mil/uL   Hemoglobin 14.7 13.0 - 17.0 g/dL   HCT 43.3 39.0 - 52.0 %   MCV 90.4 78.0 - 100.0 fl   MCHC 33.9 30.0 - 36.0 g/dL   RDW 14.0 11.5 - 15.5 %   Platelets 183.0 150.0 - 400.0 K/uL   Neutrophils Relative % 64.6 43.0 - 77.0 %   Lymphocytes Relative 26.1 12.0 - 46.0 %   Monocytes Relative 6.2 3.0 - 12.0 %   Eosinophils Relative 2.4 0.0 - 5.0 %   Basophils Relative 0.7 0.0 - 3.0 %   Neutro Abs 5.0 1.4 - 7.7 K/uL   Lymphs Abs 2.0 0.7 - 4.0 K/uL   Monocytes Absolute 0.5 0.1 - 1.0 K/uL   Eosinophils Absolute 0.2 0.0 - 0.7 K/uL   Basophils Absolute 0.1 0.0 - 0.1 K/uL  Comprehensive metabolic panel  Result Value Ref Range   Sodium 139 135 - 145 mEq/L   Potassium 3.3 (L) 3.5 - 5.1 mEq/L   Chloride 106 96 - 112 mEq/L   CO2 27 19 - 32 mEq/L   Glucose, Bld 121 (H) 70 - 99 mg/dL   BUN 23 6 - 23 mg/dL   Creatinine, Ser 1.06 0.40 - 1.50 mg/dL   Total Bilirubin 0.5 0.2 - 1.2 mg/dL   Alkaline Phosphatase 58 39 - 117 U/L   AST 26 0 - 37 U/L   ALT 24 0 - 53 U/L   Total Protein 7.3 6.0 - 8.3 g/dL   Albumin 4.2 3.5 - 5.2 g/dL   Calcium 9.0 8.4 - 10.5 mg/dL   GFR 73.15 >60.00 mL/min  Lipid panel  Result Value Ref Range   Cholesterol 168 0 - 200 mg/dL   Triglycerides 125.0 0.0 - 149.0 mg/dL   HDL 39.90 >39.00 mg/dL   VLDL 25.0 0.0 - 40.0 mg/dL   LDL Cholesterol 103 (H) 0 - 99 mg/dL   Total CHOL/HDL Ratio 4    NonHDL 128.11      Assessment:   72 y.o. male with planned surgery as above.    Cardiac Risk Estimation: per the Revised Cardiac Risk Index, the patient's risk factors for cardiac complications include NONE, putting him in: RCI RISK CLASS I (0 risk factors, risk of major cardiac compl. appr.  0.5%)  Cerebrovascular Disease 1  Congestive Heart Failure 1  Creatinine > 2 1  Diabetes Requiring Insulin 1  Ischemic Cardiac Disease   Suprainguinal Vascular Surgery, Intrathoracic Surgery, Intra-abdominal Surgery (High Risk Surgery) 1    Plan:   Medically optimized for surgery. Low cardiac risk.

## 2018-03-03 ENCOUNTER — Encounter: Payer: Self-pay | Admitting: Family Medicine

## 2018-03-03 ENCOUNTER — Ambulatory Visit (INDEPENDENT_AMBULATORY_CARE_PROVIDER_SITE_OTHER): Payer: Medicare Other | Admitting: Family Medicine

## 2018-03-03 VITALS — BP 130/78 | HR 56 | Temp 97.6°F | Ht 70.0 in | Wt 230.2 lb

## 2018-03-03 DIAGNOSIS — I480 Paroxysmal atrial fibrillation: Secondary | ICD-10-CM | POA: Diagnosis not present

## 2018-03-03 DIAGNOSIS — Z01818 Encounter for other preprocedural examination: Secondary | ICD-10-CM

## 2018-03-03 DIAGNOSIS — M170 Bilateral primary osteoarthritis of knee: Secondary | ICD-10-CM

## 2018-03-03 DIAGNOSIS — M1990 Unspecified osteoarthritis, unspecified site: Secondary | ICD-10-CM | POA: Insufficient documentation

## 2018-03-03 NOTE — Telephone Encounter (Signed)
   Primary Cardiologist: Minus Breeding, MD  Chart reviewed as part of pre-operative protocol coverage. Patient was contacted 03/03/2018 in reference to pre-operative risk assessment for pending surgery as outlined below.  Joseph Mcdaniel was last seen on 09/2017 by Dr. Percival Spanish. He has history of normal coronaries by CT 2011, remote afib, OSA, obesity. EF 40-45% by echo 07/2017 - nuc showed EF 47% with normal perfusion. Has no clinical history of CHF. RCRI 0.4% indicating low risk of cardiac complications.  Per our conversation, since that day, Joseph Mcdaniel has done well without any cardiac symptoms. He feels he is "healthy as a horse" and is able to complete over 7 METS without any angina. He and his wife are not interested in any sports but do a lot of gardening. He actually just got done weed eating for the last 2 hours and feels great.  Therefore, based on ACC/AHA guidelines, the patient would be at acceptable risk for the planned procedure without further cardiovascular testing.   I will route this recommendation to the requesting party via Epic fax function and remove from pre-op pool.  Please call with questions.  Charlie Pitter, PA-C 03/03/2018, 4:03 PM

## 2018-03-18 DIAGNOSIS — S83272A Complex tear of lateral meniscus, current injury, left knee, initial encounter: Secondary | ICD-10-CM | POA: Diagnosis not present

## 2018-03-18 DIAGNOSIS — G8918 Other acute postprocedural pain: Secondary | ICD-10-CM | POA: Diagnosis not present

## 2018-03-18 DIAGNOSIS — M11262 Other chondrocalcinosis, left knee: Secondary | ICD-10-CM | POA: Diagnosis not present

## 2018-03-18 DIAGNOSIS — M94262 Chondromalacia, left knee: Secondary | ICD-10-CM | POA: Diagnosis not present

## 2018-03-26 DIAGNOSIS — M25662 Stiffness of left knee, not elsewhere classified: Secondary | ICD-10-CM | POA: Diagnosis not present

## 2018-04-05 DIAGNOSIS — S83282A Other tear of lateral meniscus, current injury, left knee, initial encounter: Secondary | ICD-10-CM | POA: Insufficient documentation

## 2018-04-26 DIAGNOSIS — Z5189 Encounter for other specified aftercare: Secondary | ICD-10-CM | POA: Insufficient documentation

## 2018-04-27 ENCOUNTER — Telehealth: Payer: Self-pay | Admitting: Family Medicine

## 2018-04-27 DIAGNOSIS — I420 Dilated cardiomyopathy: Secondary | ICD-10-CM | POA: Insufficient documentation

## 2018-04-27 MED ORDER — TADALAFIL 5 MG PO TABS: 5.0000 mg | ORAL_TABLET | Freq: Every day | ORAL | 0 refills | Status: DC | PRN

## 2018-04-27 NOTE — Telephone Encounter (Signed)
Copied from [place] [id number]. Topic: Quick Communication - Rx Refill/Question >> Apr 27, 2018  2:12 PM Cecelia Byars, NT wrote: Medication: tadalafil (CIALIS) 5 MG tablet   Has the patient contacted their pharmacy? no: (Agent: If no, request that the patient contact the pharmacy for the refill. (Agent: If yes, when and what did the pharmacy advise  Preferred Pharmacy (with phone number or street name [facility] [address] [place] - [place] [address] [phone] (Phone) [phone] (Fax)    Agent: Please be advised that RX refills may take up to 3 business days. We ask that you follow-up with your pharmacy.

## 2018-04-27 NOTE — Progress Notes (Signed)
Cardiology Office Note    Date:  04/28/2018   ID:  Joseph Mcdaniel, DOB 02-Nov-1945, MRN [id number]  PCP:  Briscoe Deutscher, DO  Cardiologist: Minus Breeding, MD  No chief complaint on file.   History of Present Illness:  Joseph Mcdaniel is a 72 y.o. male with history of atrial fibrillation in 2013 spontaneously converted to normal sinus rhythm.  CHA2DS2-VASc score was 0 at the time and there was no need for anticoagulation.  Cardiac CT in 2011 normal coronary arteries and calcium score of 0.  Negative Lexiscan Myoview for ischemia for chest pain 07/2017.  Last saw Dr. Percival Spanish 09/25/2017 after ER visit for chest pain that was atypical.  He felt like his heart rate was going fast.  Dr. Percival Spanish felt like his paroxysms are so infrequent he did not need further medical therapy for prevention but patient would call if he had recurrence.  Thought may be flecainide may work for this patient.  Patient also has obesity and OSA on CPAP.  She comes in today accompanied by his wife.  He has no complaints today.  He got his knee fixed and he is working hard in his garden.  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  He denies chest pain, palpitations, dyspnea, dyspnea on exertion, dizziness or presyncope.  EKG 02/2018 by PCP sinus bradycardia at 48 bpm.  LDL 103 in April.  Past Medical History:  Diagnosis Date  . A-fib [facility]) with spontaneous convert to NSR 08/2012  . Adenomatous colon polyp 09/2007  . Anxiety   . Cardiac CT on 5/11, calcium score 0   . Cataracts, bilateral   . DDD (degenerative disc disease)   . ED (erectile dysfunction)   . Fibromyalgia   . Generalized OA   . GERD (gastroesophageal reflux disease)   . Myalgia and myositis   . OSA (obstructive sleep apnea), compliant with CPAP   . Vitamin D deficiency     Past Surgical History:  Procedure Laterality Date  . CHONDROPLASTY Left   . COLONOSCOPY    . LIPOMA EXCISION    . LUMBAR LAMINECTOMY    . POLYPECTOMY    . ROTATOR CUFF REPAIR  Right     Current Medications: Current Meds  Medication Sig  . aspirin (ASPIRIN 81) 81 MG chewable tablet Chew 81 mg by mouth daily.   . cholecalciferol (VITAMIN D) 1000 UNITS tablet Take 1,000 Units by mouth daily.  . fish oil-omega-3 fatty acids 1000 MG capsule Take 2 g by mouth daily.  . Multiple Vitamin (MULTIVITAMIN WITH MINERALS) TABS tablet Take 1 tablet by mouth daily.  . tadalafil (CIALIS) 5 MG tablet Take 1 tablet (5 mg total) by mouth daily as needed. Erectile dysfunction  . triamcinolone cream (KENALOG) 0.1 % triamcinolone acetonide 0.1 % topical cream     Allergies:   Codeine; Hydrocodone; Omeprazole; and Penicillins   Social History   Socioeconomic History  . Marital status: Married    Spouse name: Not on file  . Number of children: 4  . Years of education: Not on file  . Highest education level: Not on file  Occupational History  . Occupation: Retired    Comment: PT @ [employer]  Social Needs  . Financial resource strain: Not on file  . Food insecurity:    Worry: Not on file    Inability: Not on file  . Transportation needs:    Medical: Not on file    Non-medical: Not on file  Tobacco Use  .  Smoking status: Former Smoker    Packs/day: 1.50    Years: 30.00    Pack years: 45.00    Types: Cigarettes    Last attempt to quit: 09/08/1994    Years since quitting: 23.6  . Smokeless tobacco: Never Used  . Tobacco comment: Quit smoking 1996, smoked for 30 years, pt smoke 3 packs per day for 1-2 years.   Substance and Sexual Activity  . Alcohol use: Yes    Alcohol/week: 3.0 standard drinks    Types: 3 Cans of beer per week    Comment: occ beer on weekends  . Drug use: No  . Sexual activity: Not on file  Lifestyle  . Physical activity:    Days per week: Not on file    Minutes per session: Not on file  . Stress: Not on file  Relationships  . Social connections:    Talks on phone: Not on file    Gets together: Not on file    Attends religious service: Not on  file    Active member of club or organization: Not on file    Attends meetings of clubs or organizations: Not on file    Relationship status: Not on file  Other Topics Concern  . Not on file  Social History Narrative   Patient disabled due to multiple orthopedic problems.     Family History:  The patient's family history includes Breast cancer in his mother; Diabetes in his maternal aunt; Heart attack (age of onset: 9) in his father.   ROS:   Please see the history of present illness.    Review of Systems  Constitution: Negative.  HENT: Negative.   Cardiovascular: Negative.   Respiratory: Negative.   Endocrine: Negative.   Hematologic/Lymphatic: Negative.   Musculoskeletal: Negative.   Gastrointestinal: Negative.   Genitourinary: Negative.   Neurological: Negative.    All other systems reviewed and are negative.   PHYSICAL EXAM:   VS:  BP (!) 94/54   Pulse (!) 56   Ht 5\' 10"  (1.778 m)   Wt 228 lb 12.8 oz (103.8 kg)   SpO2 97%   BMI 32.83 kg/m   Physical Exam  GEN: Obese, in no acute distress  Neck: no JVD, carotid bruits, or masses Cardiac:RRR; no murmurs, rubs, or gallops  Respiratory:  clear to auscultation bilaterally, normal work of breathing GI: soft, nontender, nondistended, + BS Ext: without cyanosis, clubbing, or edema, Good distal pulses bilaterally Neuro:  Alert and Oriented x 3 Psych: euthymic mood, full affect  Wt Readings from Last 3 Encounters:  04/28/18 228 lb 12.8 oz (103.8 kg)  03/03/18 230 lb 3.2 oz (104.4 kg)  12/29/17 227 lb 9.6 oz (103.2 kg)      Studies/Labs Reviewed:   EKG:  EKG is not ordered today.  The ekg reviewed from June sinus bradycardia at 48 bpm Recent Labs: 09/25/2017: TSH 1.560 12/29/2017: ALT 24; BUN 23; Creatinine, Ser 1.06; Hemoglobin 14.7; Platelets 183.0; Potassium 3.3; Sodium 139   Lipid Panel    Component Value Date/Time   CHOL 168 12/29/2017 1527   TRIG 125.0 12/29/2017 1527   HDL 39.90 12/29/2017 1527    CHOLHDL 4 12/29/2017 1527   VLDL 25.0 12/29/2017 1527   LDLCALC 103 (H) 12/29/2017 1527    Additional studies/ records that were reviewed today include:  2D echo 11/2018Study Conclusions   - Left ventricle: The cavity size was at the upper limits of   normal. Wall thickness was normal. Systolic function was  mildly   to moderately reduced. The estimated ejection fraction was in the   range of 40% to 45%. Hypokinesis of the inferolateral myocardium.   Doppler parameters are consistent with abnormal left ventricular   relaxation (grade 1 diastolic dysfunction). - Aortic valve: There was trivial regurgitation. - Left atrium: The atrium was moderately dilated. - Right ventricle: The cavity size was mildly dilated. Systolic   function was mildly reduced. - Right atrium: The atrium was mildly dilated.    Nuclear stress test 2018Study Highlights      Nuclear stress EF: 47%. No wall motion abnormalities  There was no ST segment deviation noted during stress.  The study is normal. No ischemia identified.  This is a low risk study.        ASSESSMENT:    1. PAF (paroxysmal atrial fibrillation) ([place])   2. Essential hypertension   3. Obstructive sleep apnea   4. Dilated cardiomyopathy ([place])      PLAN:  In order of problems listed above:  PAF CHA2DS2-VASc equals 1 baseline heart rate 48.  Not on any medication.  Has had no symptoms of palpitations.  Follow-up with Dr. Percival Spanish in 1 year.  Essential hypertension patient's blood pressure actually runs low.  He is not on therapy.  Obstructive sleep apnea on CPAP  Dilated cardiomyopathy LVEF 40 to 45% with grade 1 DD 2D echo 07/2017 but follow-up Myoview 08/2017 without ischemia.  No evidence of CHF  Obesity exercise and weight loss program recommended.    Medication Adjustments/Labs and Tests Ordered: Current medicines are reviewed at length with the patient today.  Concerns regarding medicines are outlined above.   Medication changes, Labs and Tests ordered today are listed in the Patient Instructions below. Patient Instructions  Medication Instructions:  Your physician recommends that you continue on your current medications as directed. Please refer to the Current Medication list given to you today.   Labwork: None ordered  Testing/Procedures: None ordered  Follow-Up: Your physician wants you to follow-up in: [place] DR. HOCHREIN   You will receive a reminder letter in the mail two months in advance. If you don't receive a letter, please call our office to schedule the follow-up appointment.   Any Other Special Instructions Will Be Listed Below (If Applicable).     If you need a refill on your cardiac medications before your next appointment, please call your pharmacy.      Sumner Boast, PA-C  04/28/2018 10:27 AM    [facility] [place], [place], [place]  [address] Phone: [phone]; Fax: [phone]

## 2018-04-28 ENCOUNTER — Encounter: Payer: Self-pay | Admitting: Physician Assistant

## 2018-04-28 ENCOUNTER — Ambulatory Visit: Payer: Medicare Other | Admitting: Physician Assistant

## 2018-04-28 VITALS — BP 94/54 | HR 56 | Ht 70.0 in | Wt 228.8 lb

## 2018-04-28 DIAGNOSIS — I480 Paroxysmal atrial fibrillation: Secondary | ICD-10-CM | POA: Diagnosis not present

## 2018-04-28 DIAGNOSIS — E669 Obesity, unspecified: Secondary | ICD-10-CM | POA: Insufficient documentation

## 2018-04-28 DIAGNOSIS — I420 Dilated cardiomyopathy: Secondary | ICD-10-CM | POA: Diagnosis not present

## 2018-04-28 DIAGNOSIS — G4733 Obstructive sleep apnea (adult) (pediatric): Secondary | ICD-10-CM | POA: Diagnosis not present

## 2018-04-28 DIAGNOSIS — I10 Essential (primary) hypertension: Secondary | ICD-10-CM

## 2018-04-28 NOTE — Patient Instructions (Addendum)
Medication Instructions:  Your physician recommends that you continue on your current medications as directed. Please refer to the Current Medication list given to you today.   Labwork: None ordered  Testing/Procedures: None ordered  Follow-Up: Your physician wants you to follow-up in: [place] DR. HOCHREIN   You will receive a reminder letter in the mail two months in advance. If you don't receive a letter, please call our office to schedule the follow-up appointment.   Any Other Special Instructions Will Be Listed Below (If Applicable). Be sure to exercise and get into an weight loss program    If you need a refill on your cardiac medications before your next appointment, please call your pharmacy.

## 2018-05-05 DIAGNOSIS — G4733 Obstructive sleep apnea (adult) (pediatric): Secondary | ICD-10-CM | POA: Diagnosis not present

## 2018-06-14 IMAGING — NM NM MISC PROCEDURE
3 series · 18 of 18 positions shown · non-contrast
Comparison: none

[Series 1: wbr_s-proj_st stress_(id)_sa · 6.5mm · 6.51mm/px · 6 of 64 frames shown (1 of 2)]
[frame 6/64]
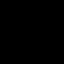
[frame 16/64]
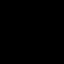
[frame 27/64]
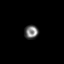
[frame 38/64]
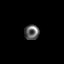
[frame 48/64]
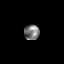
[frame 59/64]
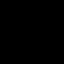

[Series 1: wbr_r-proj_st rest_(id)_sa · 6.5mm · 6.51mm/px · 6 of 64 frames shown]
[frame 6/64]
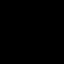
[frame 16/64]
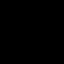
[frame 27/64]
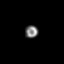
[frame 38/64]
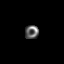
[frame 48/64]
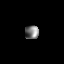
[frame 59/64]
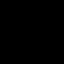

[Series 1: wbr_s-proj_st stress_(id)_sa · 6.5mm · 6.51mm/px · 6 of 512 frames shown (2 of 2)]
[frame 43/512]
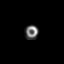
[frame 128/512]
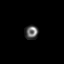
[frame 214/512]
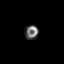
[frame 299/512]
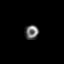
[frame 384/512]
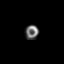
[frame 470/512]
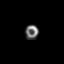

[18 of 18 positions shown; findings below may reference images not displayed]

Canned report from images found in remote index.

Refer to host system for actual result text.

## 2018-07-01 ENCOUNTER — Ambulatory Visit (INDEPENDENT_AMBULATORY_CARE_PROVIDER_SITE_OTHER): Payer: Medicare Other

## 2018-07-01 DIAGNOSIS — Z23 Encounter for immunization: Secondary | ICD-10-CM

## 2018-08-10 ENCOUNTER — Telehealth: Payer: Self-pay | Admitting: Family Medicine

## 2018-08-10 NOTE — Telephone Encounter (Signed)
Wife calling back and declined to speak with me concerning what she found out with the insurance company.  Advise I would relay message and have the nurse call her back, as Butch Penny in a room at the moment

## 2018-08-10 NOTE — Telephone Encounter (Signed)
See note  Copied from [place] [id number]. Topic: General - Other >> Aug 10, 2018 11:49 AM Bea Graff, NT wrote: Reason for CRM: Pts wife states that her husband stated he has been told he cannot have the shingles vaccine done at [facility] and she is requesting a call to discuss why. Please advise.

## 2018-08-10 NOTE — Telephone Encounter (Signed)
Spoke to pt's wife Mardene Celeste explained to her that pt just needs to check with insurance first to make sure Shingrix is covered and whether they will pay for it to be done in office or at the pharmacy. We do this so pt's do not get stuck with a big bill cause Shingrix is very expensive. Mardene Celeste verbalized understanding and said she will check with insurance for her husband.

## 2018-08-11 NOTE — Telephone Encounter (Signed)
Left message to return call to our office.  

## 2018-08-11 NOTE — Telephone Encounter (Signed)
Wife Patty calling back about  Shingles shot please call Patty back at [phone]

## 2018-08-11 NOTE — Telephone Encounter (Signed)
See note

## 2018-08-11 NOTE — Telephone Encounter (Signed)
Patients wife called back insurance will pay she will call back later to make nurse visit app for immunization.

## 2018-08-27 DIAGNOSIS — G4733 Obstructive sleep apnea (adult) (pediatric): Secondary | ICD-10-CM | POA: Diagnosis not present

## 2018-09-07 ENCOUNTER — Telehealth: Payer: Self-pay | Admitting: Physician Assistant

## 2018-09-07 ENCOUNTER — Encounter: Payer: Self-pay | Admitting: Physician Assistant

## 2018-09-07 ENCOUNTER — Ambulatory Visit (INDEPENDENT_AMBULATORY_CARE_PROVIDER_SITE_OTHER): Payer: Medicare Other | Admitting: Physician Assistant

## 2018-09-07 VITALS — BP 106/62 | HR 58 | Temp 97.7°F | Ht 70.0 in | Wt 238.8 lb

## 2018-09-07 DIAGNOSIS — J0100 Acute maxillary sinusitis, unspecified: Secondary | ICD-10-CM | POA: Diagnosis not present

## 2018-09-07 MED ORDER — DOXYCYCLINE HYCLATE 100 MG PO TABS
100.0000 mg | ORAL_TABLET | Freq: Two times a day (BID) | ORAL | 0 refills | Status: DC
Start: 2018-09-07 — End: 2018-12-22

## 2018-09-07 NOTE — Patient Instructions (Signed)
It was great to see you!  Start antibiotic: oral doxycycline  Push fluids and get plenty of rest. Please return if you are not improving as expected, or if you have high fevers (>101.5) or difficulty swallowing or worsening productive cough.  Call clinic with questions.  I hope you start feeling better soon!

## 2018-09-07 NOTE — Telephone Encounter (Signed)
See note  Copied from [place] [id number]. Topic: General - Other >> Sep 07, 2018  9:37 AM Rayann Heman wrote: Reason for CRM: pt wife called and stated that Inda Coke did not address cough or the old cough medication that is out of date. Please advise Pt wife would like a call back today.

## 2018-09-07 NOTE — Telephone Encounter (Signed)
Agree with plan 

## 2018-09-07 NOTE — Telephone Encounter (Signed)
Called and spoke with patient's wife and reviewed office visit notes from today's visit w/her.  She was concerned about his cough "not being addressed" I explained that patient was diagnosed with a sinus infection and precautions were given to return.  Pt's wife also concerned that no prescription cough medication was sent in.  I suggested that they could try plain Robitussin or Delsym OTC and if not better or feeling worse by this Friday 1/3, to give [place] a call.  Patient's wife verbalized understanding.

## 2018-09-07 NOTE — Progress Notes (Signed)
Joseph Mcdaniel is a 72 y.o. male here for a new problem.  History of Present Illness:   Chief Complaint  Patient presents with  . Cough  . sinus congestion    Cough  This is a new problem. The current episode started 1 to 4 weeks ago. The problem has been gradually worsening. The problem occurs every few hours. The cough is productive of sputum. Associated symptoms include ear congestion, ear pain, nasal congestion, postnasal drip, rhinorrhea and a sore throat. Pertinent negatives include no chest pain, chills, fever, headaches, heartburn, hemoptysis, myalgias, rash, shortness of breath, sweats, weight loss or wheezing.    Denies chest pain.  Joseph Mcdaniel has been sick with similar symptoms.   Past Medical History:  Diagnosis Date  . A-fib [facility]) with spontaneous convert to NSR 08/2012  . Adenomatous colon polyp 09/2007  . Anxiety   . Cardiac CT on 5/11, calcium score 0   . Cataracts, bilateral   . DDD (degenerative disc disease)   . ED (erectile dysfunction)   . Fibromyalgia   . Generalized OA   . GERD (gastroesophageal reflux disease)   . Myalgia and myositis   . OSA (obstructive sleep apnea), compliant with CPAP   . Vitamin D deficiency      Social History   Socioeconomic History  . Marital status: Married    Spouse name: Not on file  . Number of children: 4  . Years of education: Not on file  . Highest education level: Not on file  Occupational History  . Occupation: Retired    Comment: PT @ [employer]  Social Needs  . Financial resource strain: Not on file  . Food insecurity:    Worry: Not on file    Inability: Not on file  . Transportation needs:    Medical: Not on file    Non-medical: Not on file  Tobacco Use  . Smoking status: Former Smoker    Packs/day: 1.50    Years: 30.00    Pack years: 45.00    Types: Cigarettes    Last attempt to quit: 09/08/1994    Years since quitting: 24.0  . Smokeless tobacco: Never Used  . Tobacco comment: Quit smoking 1996,  smoked for 30 years, pt smoke 3 packs per day for 1-2 years.   Substance and Sexual Activity  . Alcohol use: Yes    Alcohol/week: 3.0 standard drinks    Types: 3 Cans of beer per week    Comment: occ beer on weekends  . Drug use: No  . Sexual activity: Not on file  Lifestyle  . Physical activity:    Days per week: Not on file    Minutes per session: Not on file  . Stress: Not on file  Relationships  . Social connections:    Talks on phone: Not on file    Gets together: Not on file    Attends religious service: Not on file    Active member of club or organization: Not on file    Attends meetings of clubs or organizations: Not on file    Relationship status: Not on file  . Intimate partner violence:    Fear of current or ex partner: Not on file    Emotionally abused: Not on file    Physically abused: Not on file    Forced sexual activity: Not on file  Other Topics Concern  . Not on file  Social History Narrative   Patient disabled due to multiple orthopedic problems.  Past Surgical History:  Procedure Laterality Date  . CHONDROPLASTY Left   . COLONOSCOPY    . LIPOMA EXCISION    . LUMBAR LAMINECTOMY    . POLYPECTOMY    . ROTATOR CUFF REPAIR Right     Family History  Problem Relation Age of Onset  . Breast cancer Mother   . Diabetes Maternal Aunt   . Heart attack Father 48  . Colon cancer Neg Hx   . Colon polyps Neg Hx   . Rectal cancer Neg Hx   . Stomach cancer Neg Hx   . Esophageal cancer Neg Hx     Allergies  Allergen Reactions  . Codeine Itching  . Hydrocodone Itching  . Omeprazole     Reaction unknown  . Penicillins Hives and Itching    Has patient had a PCN reaction causing immediate rash, facial/tongue/throat swelling, SOB or lightheadedness with hypotension: Yes Has patient had a PCN reaction causing severe rash involving mucus membranes or skin necrosis: No Has patient had a PCN reaction that required hospitalization: No Has patient had a PCN  reaction occurring within the last 10 years: No If all of the above answers are "NO", then may proceed with Cephalosporin use.     Current Medications:   Current Outpatient Medications:  .  aspirin (ASPIRIN 81) 81 MG chewable tablet, Chew 81 mg by mouth daily. , Disp: , Rfl:  .  cholecalciferol (VITAMIN D) 1000 UNITS tablet, Take 1,000 Units by mouth daily., Disp: , Rfl:  .  fish oil-omega-3 fatty acids 1000 MG capsule, Take 2 g by mouth daily., Disp: , Rfl:  .  Multiple Vitamin (MULTIVITAMIN WITH MINERALS) TABS tablet, Take 1 tablet by mouth daily., Disp: , Rfl:  .  tadalafil (CIALIS) 5 MG tablet, Take 1 tablet (5 mg total) by mouth daily as needed. Erectile dysfunction, Disp: 10 tablet, Rfl: 0 .  triamcinolone cream (KENALOG) 0.1 %, triamcinolone acetonide 0.1 % topical cream, Disp: , Rfl:  .  doxycycline (VIBRA-TABS) 100 MG tablet, Take 1 tablet (100 mg total) by mouth 2 (two) times daily., Disp: 20 tablet, Rfl: 0   Review of Systems:   Review of Systems  Constitutional: Negative for chills, fever and weight loss.  HENT: Positive for ear pain, postnasal drip, rhinorrhea and sore throat.   Respiratory: Positive for cough. Negative for hemoptysis, shortness of breath and wheezing.   Cardiovascular: Negative for chest pain.  Gastrointestinal: Negative for heartburn.  Musculoskeletal: Negative for myalgias.  Skin: Negative for rash.  Neurological: Negative for headaches.    Vitals:   Vitals:   09/07/18 0842  BP: 106/62  Pulse: (!) 58  Temp: 97.7 F (36.5 C)  TempSrc: Oral  SpO2: 97%  Weight: 238 lb 12.8 oz (108.3 kg)  Height: 5\' 10"  (1.778 m)     Body mass index is 34.26 kg/m.  Physical Exam:   Physical Exam Vitals signs and nursing note reviewed.  Constitutional:      General: He is not in acute distress.    Appearance: He is well-developed. He is not ill-appearing or toxic-appearing.  HENT:     Head: Normocephalic and atraumatic.     Right Ear: Tympanic  membrane, ear canal and external ear normal. Tympanic membrane is not erythematous, retracted or bulging.     Left Ear: Tympanic membrane, ear canal and external ear normal. Tympanic membrane is not erythematous, retracted or bulging.     Nose:     Right Sinus: Maxillary sinus tenderness present. No  frontal sinus tenderness.     Left Sinus: Maxillary sinus tenderness present. No frontal sinus tenderness.     Mouth/Throat:     Lips: Pink.     Mouth: Mucous membranes are moist.     Pharynx: Uvula midline. Posterior oropharyngeal erythema present.     Tonsils: No tonsillar exudate. Swelling: 1+ on the right. 1+ on the left.  Eyes:     General: Lids are normal.     Conjunctiva/sclera: Conjunctivae normal.  Neck:     Trachea: Trachea normal.  Cardiovascular:     Rate and Rhythm: Normal rate and regular rhythm.     Heart sounds: Normal heart sounds, S1 normal and S2 normal.  Pulmonary:     Effort: Pulmonary effort is normal.     Breath sounds: Normal breath sounds. No decreased breath sounds, wheezing, rhonchi or rales.  Lymphadenopathy:     Cervical: No cervical adenopathy.  Skin:    General: Skin is warm and dry.  Neurological:     Mental Status: He is alert.  Psychiatric:        Speech: Speech normal.        Behavior: Behavior normal. Behavior is cooperative.     Assessment and Plan:   Gareld was seen today for cough and sinus congestion.  Diagnoses and all orders for this visit:  Acute non-recurrent maxillary sinusitis  Other orders -     doxycycline (VIBRA-TABS) 100 MG tablet; Take 1 tablet (100 mg total) by mouth 2 (two) times daily.   No red flags on exam.  Will initiate doxycycline per orders. Discussed taking medications as prescribed. Reviewed return precautions including worsening fever, SOB, worsening cough or other concerns. Push fluids and rest. I recommend that patient follow-up if symptoms worsen or persist despite treatment x 7-10 days, sooner if  needed.  . Reviewed expectations re: course of current medical issues. . Discussed self-management of symptoms. . Outlined signs and symptoms indicating need for more acute intervention. . Patient verbalized understanding and all questions were answered. . See orders for this visit as documented in the electronic medical record. . Patient received an After-Visit Summary.   Inda Coke, PA-C

## 2018-10-25 ENCOUNTER — Telehealth: Payer: Self-pay | Admitting: Family Medicine

## 2018-10-25 NOTE — Telephone Encounter (Signed)
See note  Copied from [place] [id number]. Topic: Appointment Scheduling - Scheduling Inquiry for Clinic >> Oct 25, 2018  3:19 PM Virl Axe D wrote: Reason for CRM: Pt's wife called to schedule his Shingrix vaccines. Please advise. CB# [phone]

## 2018-10-26 NOTE — Telephone Encounter (Signed)
See note

## 2018-10-26 NOTE — Telephone Encounter (Signed)
Left message to return call to our office.  Need to have patient check with insurance and see if covered in office.

## 2018-10-26 NOTE — Telephone Encounter (Signed)
Can you call to make app for nurse visit.

## 2018-10-26 NOTE — Telephone Encounter (Signed)
Patient's wife said she contacted them several months ago and it was not that expensive. She does not remember the dollar amount.

## 2018-10-26 NOTE — Telephone Encounter (Signed)
Called pts wife and schedule nurse visit. Pt wanted to wait until April for visit.

## 2018-11-11 DIAGNOSIS — H40013 Open angle with borderline findings, low risk, bilateral: Secondary | ICD-10-CM | POA: Diagnosis not present

## 2018-11-11 DIAGNOSIS — H04123 Dry eye syndrome of bilateral lacrimal glands: Secondary | ICD-10-CM | POA: Diagnosis not present

## 2018-11-11 DIAGNOSIS — H2513 Age-related nuclear cataract, bilateral: Secondary | ICD-10-CM | POA: Diagnosis not present

## 2018-12-03 DIAGNOSIS — G4733 Obstructive sleep apnea (adult) (pediatric): Secondary | ICD-10-CM | POA: Diagnosis not present

## 2018-12-14 ENCOUNTER — Ambulatory Visit: Payer: Medicare Other

## 2018-12-22 ENCOUNTER — Encounter: Payer: Self-pay | Admitting: Physician Assistant

## 2018-12-22 ENCOUNTER — Ambulatory Visit (INDEPENDENT_AMBULATORY_CARE_PROVIDER_SITE_OTHER): Payer: Medicare Other | Admitting: Physician Assistant

## 2018-12-22 VITALS — BP 105/67 | Ht 70.0 in | Wt 235.0 lb

## 2018-12-22 DIAGNOSIS — N529 Male erectile dysfunction, unspecified: Secondary | ICD-10-CM

## 2018-12-22 DIAGNOSIS — K5900 Constipation, unspecified: Secondary | ICD-10-CM

## 2018-12-22 MED ORDER — TADALAFIL 5 MG PO TABS: 5.0000 mg | ORAL_TABLET | Freq: Every day | ORAL | 0 refills | Status: DC | PRN
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
## 2019-01-04 ENCOUNTER — Encounter: Payer: Medicare Other | Admitting: Family Medicine

## 2019-01-19 ENCOUNTER — Ambulatory Visit (INDEPENDENT_AMBULATORY_CARE_PROVIDER_SITE_OTHER): Payer: Medicare Other | Admitting: Family Medicine

## 2019-01-19 ENCOUNTER — Encounter: Payer: Self-pay | Admitting: Family Medicine

## 2019-01-19 DIAGNOSIS — Z23 Encounter for immunization: Secondary | ICD-10-CM | POA: Diagnosis not present

## 2019-01-19 NOTE — Progress Notes (Signed)
Shringrix given IM, left deltoid, pt tolerated well.

## 2019-01-26 ENCOUNTER — Telehealth: Payer: Self-pay | Admitting: Family Medicine

## 2019-01-26 NOTE — Telephone Encounter (Signed)
Called wife reviewed all information with wife. She was informed that we made the appointment based on the phone note with them requesting singles. That we will not do labs before his appointment in June. That Dr. Juleen China likes to meet with patient to make sure that labs that we are doing are what he needs.

## 2019-01-26 NOTE — Telephone Encounter (Signed)
See note  Copied from [place] [id number]. Topic: General - Inquiry >> Jan 26, 2019  1:03 PM Marin Olp L wrote: Reason for MDY:JWLK, Chong Sicilian, calling to discuss patient's appt on 01/19/2019. She says she spoke with someone at the practice who went over lab orders he was supposed to have, but when he came, he was given a Shingrix vaccine, which was not discussed, and no labs were drawn. Patty would like a call back from a member of Dr. Alcario Drought team to figure out what happened.

## 2019-01-31 ENCOUNTER — Encounter: Payer: Self-pay | Admitting: Gastroenterology

## 2019-02-07 HISTORY — PX: CATARACT EXTRACTION: SUR2

## 2019-02-09 ENCOUNTER — Encounter: Payer: Self-pay | Admitting: Gastroenterology

## 2019-02-09 ENCOUNTER — Ambulatory Visit (AMBULATORY_SURGERY_CENTER): Payer: Self-pay

## 2019-02-09 ENCOUNTER — Other Ambulatory Visit: Payer: Self-pay

## 2019-02-09 VITALS — Ht 70.0 in | Wt 231.8 lb

## 2019-02-09 DIAGNOSIS — Z86010 Personal history of colonic polyps: Secondary | ICD-10-CM

## 2019-02-09 MED ORDER — NA SULFATE-K SULFATE-MG SULF 17.5-3.13-1.6 GM/177ML PO SOLN: 1.0000 | Freq: Once | ORAL | 0 refills | Status: AC

## 2019-02-09 NOTE — Progress Notes (Signed)
No egg or soy allergy known to patient  No issues with past sedation with any surgeries  or procedures, no intubation problems  No diet pills per patient No home 02 use per patient  No blood thinners per patient  Pt having a two day prep Hx of A-Fib  EMMI video sent to pt's e mail

## 2019-02-14 DIAGNOSIS — H2511 Age-related nuclear cataract, right eye: Secondary | ICD-10-CM | POA: Diagnosis not present

## 2019-02-21 ENCOUNTER — Other Ambulatory Visit: Payer: Self-pay

## 2019-02-21 ENCOUNTER — Ambulatory Visit (INDEPENDENT_AMBULATORY_CARE_PROVIDER_SITE_OTHER): Payer: Medicare Other | Admitting: Family Medicine

## 2019-02-21 ENCOUNTER — Encounter: Payer: Self-pay | Admitting: Family Medicine

## 2019-02-21 VITALS — BP 116/64 | HR 60 | Temp 97.9°F | Ht 70.0 in | Wt 230.2 lb

## 2019-02-21 DIAGNOSIS — I10 Essential (primary) hypertension: Secondary | ICD-10-CM | POA: Diagnosis not present

## 2019-02-21 DIAGNOSIS — E7800 Pure hypercholesterolemia, unspecified: Secondary | ICD-10-CM | POA: Diagnosis not present

## 2019-02-21 DIAGNOSIS — I420 Dilated cardiomyopathy: Secondary | ICD-10-CM | POA: Diagnosis not present

## 2019-02-21 DIAGNOSIS — R739 Hyperglycemia, unspecified: Secondary | ICD-10-CM | POA: Diagnosis not present

## 2019-02-21 DIAGNOSIS — R5383 Other fatigue: Secondary | ICD-10-CM

## 2019-02-21 DIAGNOSIS — Z0000 Encounter for general adult medical examination without abnormal findings: Secondary | ICD-10-CM | POA: Diagnosis not present

## 2019-02-21 DIAGNOSIS — E66813 Obesity, class 3: Secondary | ICD-10-CM

## 2019-02-22 ENCOUNTER — Encounter: Payer: Self-pay | Admitting: Family Medicine

## 2019-02-22 ENCOUNTER — Telehealth: Payer: Self-pay | Admitting: Gastroenterology

## 2019-02-22 LAB — COMPREHENSIVE METABOLIC PANEL
ALT: 18 U/L (ref 0–53)
AST: 16 U/L (ref 0–37)
Albumin: 4.2 g/dL (ref 3.5–5.2)
Alkaline Phosphatase: 63 U/L (ref 39–117)
BUN: 27 mg/dL — ABNORMAL HIGH (ref 6–23)
CO2: 28 mEq/L (ref 19–32)
Calcium: 8.7 mg/dL (ref 8.4–10.5)
Chloride: 105 mEq/L (ref 96–112)
Creatinine, Ser: 0.95 mg/dL (ref 0.40–1.50)
GFR: 77.85 mL/min (ref >60.00)
Glucose, Bld: 93 mg/dL (ref 70–99)
Potassium: 3.5 mEq/L (ref 3.5–5.1)
Sodium: 141 mEq/L (ref 135–145)
Total Bilirubin: 0.5 mg/dL (ref 0.2–1.2)
Total Protein: 6.7 g/dL (ref 6.0–8.3)

## 2019-02-22 LAB — HEMOGLOBIN A1C: Hgb A1c MFr Bld: 6 % (ref 4.6–6.5)

## 2019-02-22 LAB — LIPID PANEL
Cholesterol: 161 mg/dL (ref 0–200)
HDL: 35.2 mg/dL — ABNORMAL LOW (ref >39.00)
NonHDL: 125.72
Total CHOL/HDL Ratio: 5
Triglycerides: 208 mg/dL — ABNORMAL HIGH (ref 0.0–149.0)
VLDL: 41.6 mg/dL — ABNORMAL HIGH (ref 0.0–40.0)

## 2019-02-22 LAB — LDL CHOLESTEROL, DIRECT: Direct LDL: 107 mg/dL

## 2019-02-22 LAB — CBC WITH DIFFERENTIAL/PLATELET
Basophils Absolute: 0.1 10*3/uL (ref 0.0–0.1)
Basophils Relative: 0.7 % (ref 0.0–3.0)
Eosinophils Absolute: 0.2 10*3/uL (ref 0.0–0.7)
Eosinophils Relative: 2.5 % (ref 0.0–5.0)
HCT: 43.7 % (ref 39.0–52.0)
Hemoglobin: 14.6 g/dL (ref 13.0–17.0)
Lymphocytes Relative: 22.5 % (ref 12.0–46.0)
Lymphs Abs: 1.9 10*3/uL (ref 0.7–4.0)
MCHC: 33.4 g/dL (ref 30.0–36.0)
MCV: 90.8 fl (ref 78.0–100.0)
Monocytes Absolute: 0.6 10*3/uL (ref 0.1–1.0)
Monocytes Relative: 6.8 % (ref 3.0–12.0)
Neutro Abs: 5.8 10*3/uL (ref 1.4–7.7)
Neutrophils Relative %: 67.5 % (ref 43.0–77.0)
Platelets: 175 10*3/uL (ref 150.0–400.0)
RBC: 4.81 Mil/uL (ref 4.22–5.81)
RDW: 14.1 % (ref 11.5–15.5)
WBC: 8.5 10*3/uL (ref 4.0–10.5)

## 2019-02-22 LAB — TSH: TSH: 1.52 u[IU]/mL (ref 0.35–4.50)

## 2019-02-22 NOTE — Telephone Encounter (Signed)
Left message to call back to ask Covid-19 screening questions. ° °Covid-19 Screening Questions: ° °  °Do you now or have you had a fever in the last 14 days?     ° °Do you have any respiratory symptoms of shortness of breath or cough now or in the last 14 days?    ° °Do you have any family members or close contacts with diagnosed or suspected Covid-19 in the past 14 days?     ° °Have you been tested for Covid-19 and found to be positive?    ° ° °Pt made aware of that care partner may come to the lobby during the procedure but will need to provide their own mask and was asked to bring one if available. ° °  °

## 2019-02-22 NOTE — Telephone Encounter (Signed)
Pt returned called:  Covid-19 Screening Questions:    Do you now or have you had a fever in the last 14 days?  No   Do you have any respiratory symptoms of shortness of breath or cough now or in the last 14 days?  No  Do you have any family members or close contacts with diagnosed or suspected Covid-19 in the past 14 days?  No  Have you been tested for Covid-19 and found to be positive? No    Pt made aware of that care partner may come to the lobby during the procedure but will need to provide their own mask and was asked to bring one if available.

## 2019-02-23 ENCOUNTER — Encounter: Payer: Medicare Other | Admitting: Gastroenterology

## 2019-02-23 ENCOUNTER — Encounter: Payer: Self-pay | Admitting: Gastroenterology

## 2019-02-23 ENCOUNTER — Other Ambulatory Visit: Payer: Self-pay

## 2019-02-23 ENCOUNTER — Ambulatory Visit (AMBULATORY_SURGERY_CENTER): Payer: Medicare Other | Admitting: Gastroenterology

## 2019-02-23 VITALS — BP 131/73 | HR 48 | Temp 97.9°F | Resp 16 | Ht 70.0 in | Wt 230.0 lb

## 2019-02-23 DIAGNOSIS — D123 Benign neoplasm of transverse colon: Secondary | ICD-10-CM

## 2019-02-23 DIAGNOSIS — D120 Benign neoplasm of cecum: Secondary | ICD-10-CM | POA: Diagnosis not present

## 2019-02-23 DIAGNOSIS — D125 Benign neoplasm of sigmoid colon: Secondary | ICD-10-CM | POA: Diagnosis not present

## 2019-02-23 DIAGNOSIS — D124 Benign neoplasm of descending colon: Secondary | ICD-10-CM | POA: Diagnosis not present

## 2019-02-23 DIAGNOSIS — Z86010 Personal history of colonic polyps: Secondary | ICD-10-CM | POA: Diagnosis not present

## 2019-02-23 MED ORDER — SODIUM CHLORIDE 0.9 % IV SOLN: 500.0000 mL | Freq: Once | INTRAVENOUS | Status: DC

## 2019-02-23 NOTE — Patient Instructions (Signed)
Handouts given:Polyps and Hemorrhoids   YOU HAD AN ENDOSCOPIC PROCEDURE TODAY AT [facility]:   Refer to the procedure report that was given to you for any specific questions about what was found during the examination.  If the procedure report does not answer your questions, please call your gastroenterologist to clarify.  If you requested that your care partner not be given the details of your procedure findings, then the procedure report has been included in a sealed envelope for you to review at your convenience later.  YOU SHOULD EXPECT: Some feelings of bloating in the abdomen. Passage of more gas than usual.  Walking can help get rid of the air that was put into your GI tract during the procedure and reduce the bloating. If you had a lower endoscopy (such as a colonoscopy or flexible sigmoidoscopy) you may notice spotting of blood in your stool or on the toilet paper. If you underwent a bowel prep for your procedure, you may not have a normal bowel movement for a few days.  Please Note:  You might notice some irritation and congestion in your nose or some drainage.  This is from the oxygen used during your procedure.  There is no need for concern and it should clear up in a day or so.  SYMPTOMS TO REPORT IMMEDIATELY:   Following lower endoscopy (colonoscopy or flexible sigmoidoscopy):  Excessive amounts of blood in the stool  Significant tenderness or worsening of abdominal pains  Swelling of the abdomen that is new, acute  Fever of 100F or higher  For urgent or emergent issues, a gastroenterologist can be reached at any hour by calling [phone].   DIET:  We do recommend a small meal at first, but then you may proceed to your regular diet.  Drink plenty of fluids but you should avoid alcoholic beverages for 24 hours.  ACTIVITY:  You should plan to take it easy for the rest of today and you should NOT DRIVE or use heavy machinery until tomorrow (because of the  sedation medicines used during the test).    FOLLOW UP: Our staff will call the number listed on your records 48-72 hours following your procedure to check on you and address any questions or concerns that you may have regarding the information given to you following your procedure. If we do not reach you, we will leave a message.  We will attempt to reach you two times.  During this call, we will ask if you have developed any symptoms of COVID 19. If you develop any symptoms (ie: fever, flu-like symptoms, shortness of breath, cough etc.) before then, please call [phone].  If you test positive for Covid 19 in the 2 weeks post procedure, please call and report this information to [place].    If any biopsies were taken you will be contacted by phone or by letter within the next 1-3 weeks.  Please call us at [phone] if you have not heard about the biopsies in 3 weeks.    SIGNATURES/CONFIDENTIALITY: You and/or your care partner have signed paperwork which will be entered into your electronic medical record.  These signatures attest to the fact that that the information above on your After Visit Summary has been reviewed and is understood.  Full responsibility of the confidentiality of this discharge information lies with you and/or your care-partner.
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
## 2019-02-23 NOTE — Op Note (Signed)
[name] Patient Name: Joseph Mcdaniel Procedure Date: 02/23/2019 12:55 PM MRN: [id number] Endoscopist: Remo Lipps P. Havery [name] , MD Age: 73 Referring MD:  Date of Birth: 1946/04/17 Gender: Male Account #: [id number] Procedure:                Colonoscopy Indications:              Surveillance: Personal history of adenomatous                            polyps on last colonoscopy 3 years ago Medicines:                Monitored Anesthesia Care Procedure:                Pre-Anesthesia Assessment:                           - Prior to the procedure, a History and Physical                            was performed, and patient medications and                            allergies were reviewed. The patient's tolerance of                            previous anesthesia was also reviewed. The risks                            and benefits of the procedure and the sedation                            options and risks were discussed with the patient.                            All questions were answered, and informed consent                            was obtained. Prior Anticoagulants: The patient has                            taken no previous anticoagulant or antiplatelet                            agents. ASA Grade Assessment: III - A patient with                            severe systemic disease. After reviewing the risks                            and benefits, the patient was deemed in                            satisfactory condition to undergo the procedure.  After obtaining informed consent, the colonoscope                            was passed under direct vision. Throughout the                            procedure, the patient's blood pressure, pulse, and                            oxygen saturations were monitored continuously. The                            Colonoscope was introduced through the anus and                            advanced to the  the cecum, identified by                            appendiceal orifice and ileocecal valve. The                            colonoscopy was performed without difficulty. The                            patient tolerated the procedure well. The quality                            of the bowel preparation was adequate. The                            ileocecal valve, appendiceal orifice, and rectum                            were photographed. Scope In: 12:58:12 PM Scope Out: 1:23:24 PM Scope Withdrawal Time: 0 hours 21 minutes 55 seconds  Total Procedure Duration: 0 hours 25 minutes 12 seconds  Findings:                 The perianal and digital rectal examinations were                            normal.                           Two sessile polyps were found in the cecum. The                            polyps were diminutive in size. These polyps were                            removed with a cold biopsy forceps. Resection and                            retrieval were complete.  Two sessile polyps were found in the transverse                            colon. The polyps were 3 mm in size. These polyps                            were removed with a cold snare. Resection and                            retrieval were complete.                           A 3 mm polyp was found in the descending colon. The                            polyp was sessile. The polyp was removed with a                            cold snare. Resection and retrieval were complete.                           A 3 mm polyp was found in the sigmoid colon. The                            polyp was sessile. The polyp was removed with a                            cold snare. Resection and retrieval were complete.                           There was a small lipoma, in the sigmoid colon.                           Internal hemorrhoids were found.                           The prep was fair on insertion.  Several minutes                            spent lavaging the colon to achieve adequate views.                            The exam was otherwise without abnormality. Complications:            No immediate complications. Estimated blood loss:                            Minimal. Estimated Blood Loss:     Estimated blood loss was minimal. Impression:               - Two diminutive polyps in the cecum, removed with  a cold biopsy forceps. Resected and retrieved.                           - Two 3 mm polyps in the transverse colon, removed                            with a cold snare. Resected and retrieved.                           - One 3 mm polyp in the descending colon, removed                            with a cold snare. Resected and retrieved.                           - One 3 mm polyp in the sigmoid colon, removed with                            a cold snare. Resected and retrieved.                           - Small lipoma in the sigmoid colon.                           - Internal hemorrhoids.                           - The examination was otherwise normal. Recommendation:           - Patient has a contact number available for                            emergencies. The signs and symptoms of potential                            delayed complications were discussed with the                            patient. Return to normal activities tomorrow.                            Written discharge instructions were provided to the                            patient.                           - Resume previous diet.                           - Continue present medications.                           - Await pathology results. Remo Lipps P. [name], MD 02/23/2019 1:29:55 PM This report has been signed electronically.

## 2019-02-23 NOTE — Progress Notes (Signed)
Called to room to assist during endoscopic procedure.  Patient ID and intended procedure confirmed with present staff. Received instructions for my participation in the procedure from the performing physician.  

## 2019-02-23 NOTE — Progress Notes (Signed)
A/ox3, pleased with MAC, report to RN 

## 2019-02-25 ENCOUNTER — Telehealth: Payer: Self-pay | Admitting: *Deleted

## 2019-02-25 ENCOUNTER — Encounter: Payer: Self-pay | Admitting: Family Medicine

## 2019-02-25 ENCOUNTER — Telehealth: Payer: Self-pay

## 2019-02-25 DIAGNOSIS — H2512 Age-related nuclear cataract, left eye: Secondary | ICD-10-CM | POA: Diagnosis not present

## 2019-02-25 NOTE — Telephone Encounter (Signed)
Covid-19 screening questions   Do you now or have you had a fever in the last 14 days? no  Do you have any respiratory symptoms of shortness of breath or cough now or in the last 14 days? no  Do you have any family members or close contacts with diagnosed or suspected Covid-19 in the past 14 days? no  Have you been tested for Covid-19 and found to be positive? no             Follow up Call-  Call back number 02/23/2019  Post procedure Call Back phone  # [phone]  Permission to leave phone message Yes  Some recent data might be hidden     Patient questions:  Do you have a fever, pain , or abdominal swelling? No. Pain Score  0 *  Have you tolerated food without any problems? Yes.    Have you been able to return to your normal activities? Yes.    Do you have any questions about your discharge instructions: Diet   No. Medications  No. Follow up visit  No.  Do you have questions or concerns about your Care? No.  Actions: * If pain score is 4 or above: No action needed, pain <4.

## 2019-02-25 NOTE — Telephone Encounter (Signed)
Follow up call made, left a voicemail , name identifier.

## 2019-02-28 DIAGNOSIS — G4733 Obstructive sleep apnea (adult) (pediatric): Secondary | ICD-10-CM | POA: Diagnosis not present

## 2019-02-28 NOTE — Telephone Encounter (Signed)
Spoke to pt's wife due to pt does not hear well on the phone. Told her Dr. Juleen China said we can gladly order Vit D and PSA for him just need to schedule lab appt. Mrs. Iribe verbalized understanding and had a lot of questions about pt's Lipid panel. Answered her questions to the best of my ability and told her I will mail info on Fat and Cholesterol, foods to eat. Mrs. Brookback verbalized understanding.

## 2019-03-02 NOTE — Telephone Encounter (Signed)
See other encounter/phone not for documentation.

## 2019-03-06 ENCOUNTER — Encounter: Payer: Self-pay | Admitting: Family Medicine

## 2019-03-07 DIAGNOSIS — H2512 Age-related nuclear cataract, left eye: Secondary | ICD-10-CM | POA: Diagnosis not present

## 2019-05-30 DIAGNOSIS — G4733 Obstructive sleep apnea (adult) (pediatric): Secondary | ICD-10-CM | POA: Diagnosis not present

## 2019-07-11 ENCOUNTER — Ambulatory Visit (INDEPENDENT_AMBULATORY_CARE_PROVIDER_SITE_OTHER): Payer: Medicare Other | Admitting: Family Medicine

## 2019-07-11 ENCOUNTER — Other Ambulatory Visit: Payer: Self-pay

## 2019-07-11 ENCOUNTER — Encounter: Payer: Self-pay | Admitting: Family Medicine

## 2019-07-11 VITALS — BP 110/62 | HR 53 | Temp 98.3°F | Ht 70.0 in | Wt 232.2 lb

## 2019-07-11 DIAGNOSIS — N529 Male erectile dysfunction, unspecified: Secondary | ICD-10-CM

## 2019-07-11 DIAGNOSIS — I420 Dilated cardiomyopathy: Secondary | ICD-10-CM | POA: Diagnosis not present

## 2019-07-11 DIAGNOSIS — G4733 Obstructive sleep apnea (adult) (pediatric): Secondary | ICD-10-CM | POA: Diagnosis not present

## 2019-07-11 DIAGNOSIS — Z23 Encounter for immunization: Secondary | ICD-10-CM | POA: Diagnosis not present

## 2019-07-11 DIAGNOSIS — I10 Essential (primary) hypertension: Secondary | ICD-10-CM | POA: Diagnosis not present

## 2019-07-11 DIAGNOSIS — I480 Paroxysmal atrial fibrillation: Secondary | ICD-10-CM

## 2019-07-11 DIAGNOSIS — M170 Bilateral primary osteoarthritis of knee: Secondary | ICD-10-CM

## 2019-07-11 DIAGNOSIS — Z9989 Dependence on other enabling machines and devices: Secondary | ICD-10-CM

## 2019-07-11 NOTE — Assessment & Plan Note (Signed)
Continue CPAP.  

## 2019-07-11 NOTE — Assessment & Plan Note (Signed)
Continue over-the-counter analgesics as needed.

## 2019-07-11 NOTE — Progress Notes (Signed)
   Chief Complaint:  Joseph Mcdaniel is a 73 y.o. male who presents today with a chief complaint of ED.   Assessment/Plan:  PAF (paroxysmal atrial fibrillation) (HCC) Regular rate and rhythm today.  Continue management per cardiology.  OSA on CPAP Continue CPAP.  Erectile dysfunction Continue Cialis 5 mg daily as needed.  Dilated cardiomyopathy ([place]) Continue management per cardiology.  Osteoarthritis of both knees Continue over-the-counter analgesics as needed.  Essential hypertension At goal off medications.     Subjective:  HPI:  His stable, chronic medical conditions are outlined below:  # Essential hypertension/dilated cardiomyopathy/proximal atrial fibrillation - Follows with cardiology - Dr Warren Lacy -ROS: No chest pain or shortness of breath  # Erectile dysfunction - On Cialis 5 mg daily as needed and tolerating well  # GERD - Uses pepcid as needed which works well  # Osteoarthritis - Uses OTC medications as needed  # OSA on CPAP - Compliant with CPAP  ROS: Per HPI  PMH: He reports that he quit smoking about 24 years ago. His smoking use included cigarettes. He has a 45.00 pack-year smoking history. He has never used smokeless tobacco. He reports current alcohol use of about 3.0 standard drinks of alcohol per week. He reports that he does not use drugs.      Objective:  Physical Exam: BP 110/62   Pulse (!) 53   Temp 98.3 F (36.8 C)   Ht 5\' 10"  (1.778 m)   Wt 232 lb 4 oz (105.3 kg)   SpO2 98%   BMI 33.32 kg/m   Gen: NAD, resting comfortably CV: Regular rate and rhythm with no murmurs appreciated Pulm: Normal work of breathing, clear to auscultation bilaterally with no crackles, wheezes, or rhonchi GI: Normal bowel sounds present. Soft, Nontender, Nondistended. MSK: No edema, cyanosis, or clubbing noted Skin: Warm, dry Neuro: Grossly normal, moves all extremities Psych: Normal affect and thought content  No results found for this or any  previous visit (from the past 24 hour(s)).      Algis Greenhouse. Jerline Pain, MD 07/11/2019 10:31 AM

## 2019-07-11 NOTE — Assessment & Plan Note (Signed)
Regular rate and rhythm today.  Continue management per cardiology. 

## 2019-07-11 NOTE — Assessment & Plan Note (Signed)
At goal off medications. 

## 2019-07-11 NOTE — Assessment & Plan Note (Signed)
Continue management per cardiology. 

## 2019-07-11 NOTE — Patient Instructions (Signed)
It was very nice to see you today!  No changes today.  Please come back next week to get the second dose of your Shingrix if you can.  Please come back to see me in June for your physical, or sooner if needed.  Take care, Dr Jerline Pain  Please try these tips to maintain a healthy lifestyle:   Eat at least 3 REAL meals and 1-2 snacks per day.  Aim for no more than 5 hours between eating.  If you eat breakfast, please do so within one hour of getting up.    Obtain twice as many fruits/vegetables as protein or carbohydrate foods for both lunch and dinner. (Half of each meal should be fruits/vegetables, one quarter protein, and one quarter starchy carbs)   Cut down on sweet beverages. This includes juice, soda, and sweet tea.    Exercise at least 150 minutes every week.

## 2019-07-11 NOTE — Assessment & Plan Note (Signed)
Continue Cialis 5 mg daily as needed.

## 2019-08-17 DIAGNOSIS — H40013 Open angle with borderline findings, low risk, bilateral: Secondary | ICD-10-CM | POA: Diagnosis not present

## 2019-08-17 DIAGNOSIS — Z961 Presence of intraocular lens: Secondary | ICD-10-CM | POA: Diagnosis not present

## 2019-08-17 DIAGNOSIS — H0102A Squamous blepharitis right eye, upper and lower eyelids: Secondary | ICD-10-CM | POA: Diagnosis not present

## 2019-08-17 DIAGNOSIS — H04123 Dry eye syndrome of bilateral lacrimal glands: Secondary | ICD-10-CM | POA: Diagnosis not present

## 2019-08-17 DIAGNOSIS — H0102B Squamous blepharitis left eye, upper and lower eyelids: Secondary | ICD-10-CM | POA: Diagnosis not present

## 2019-08-29 DIAGNOSIS — G4733 Obstructive sleep apnea (adult) (pediatric): Secondary | ICD-10-CM | POA: Diagnosis not present

## 2019-10-03 DIAGNOSIS — Z961 Presence of intraocular lens: Secondary | ICD-10-CM | POA: Diagnosis not present

## 2019-10-10 ENCOUNTER — Other Ambulatory Visit: Payer: Self-pay

## 2019-10-11 ENCOUNTER — Encounter: Payer: Self-pay | Admitting: Family Medicine

## 2019-10-11 ENCOUNTER — Ambulatory Visit (INDEPENDENT_AMBULATORY_CARE_PROVIDER_SITE_OTHER): Payer: Medicare Other | Admitting: Family Medicine

## 2019-10-11 VITALS — BP 122/66 | HR 65 | Temp 98.2°F | Ht 70.0 in | Wt 237.2 lb

## 2019-10-11 DIAGNOSIS — I10 Essential (primary) hypertension: Secondary | ICD-10-CM

## 2019-10-11 DIAGNOSIS — N529 Male erectile dysfunction, unspecified: Secondary | ICD-10-CM

## 2019-10-11 DIAGNOSIS — Z23 Encounter for immunization: Secondary | ICD-10-CM | POA: Diagnosis not present

## 2019-10-11 DIAGNOSIS — Z125 Encounter for screening for malignant neoplasm of prostate: Secondary | ICD-10-CM | POA: Diagnosis not present

## 2019-10-11 LAB — PSA: PSA: 2.14 ng/mL (ref 0.10–4.00)

## 2019-10-11 NOTE — Assessment & Plan Note (Signed)
Stable. Continue cialis 5mg  daily as needed.

## 2019-10-11 NOTE — Progress Notes (Signed)
   Joseph Mcdaniel is a 74 y.o. male who presents today for an office visit.  Assessment/Plan:  Chronic Problems Addressed Today: Erectile dysfunction Stable. Continue cialis 5mg  daily as needed.   Essential hypertension At goal off medications.   Preventative Healthcare Check PSA.  Second shingles vaccine given today.    Subjective:  HPI:  Patient would like to have PSA checked today.  He is also like to have second shingles vaccine.  Recently lost a son.  He has been managing as well as can be expected.  He has good support system in place.       Objective:  Physical Exam: BP 122/66   Pulse 65   Temp 98.2 F (36.8 C)   Ht 5\' 10"  (1.778 m)   Wt 237 lb 3.2 oz (107.6 kg)   SpO2 99%   BMI 34.03 kg/m   Gen: No acute distress, resting comfortably CV: Regular rate and rhythm with no murmurs appreciated Pulm: Normal work of breathing, clear to auscultation bilaterally with no crackles, wheezes, or rhonchi GU: Normal male genitalia.  Bilateral testicles without appreciable lumps or masses.  DRE deferred. Neuro: Grossly normal, moves all extremities Psych: Normal affect and thought content      Joseph Mcdaniel M. Jerline Pain, MD 10/11/2019 9:15 AM

## 2019-10-11 NOTE — Patient Instructions (Addendum)
It was very nice to see you today!  We will give you your shingles vaccine today and check your PSA.  Please come back in late June or July for your annual physical with blood work.  Take care, Dr Jerline Pain  Please try these tips to maintain a healthy lifestyle:   Eat at least 3 REAL meals and 1-2 snacks per day.  Aim for no more than 5 hours between eating.  If you eat breakfast, please do so within one hour of getting up.    Each meal should contain half fruits/vegetables, one quarter protein, and one quarter carbs (no bigger than a computer mouse)   Cut down on sweet beverages. This includes juice, soda, and sweet tea.     Drink at least 1 glass of water with each meal and aim for at least 8 glasses per day   Exercise at least 150 minutes every week.

## 2019-10-11 NOTE — Assessment & Plan Note (Signed)
At goal off medications. 

## 2019-10-11 NOTE — Progress Notes (Signed)
Dr Marigene Ehlers interpretation of your lab work:  Your prostate numbers are NORMAL.    If you have any additional questions, please give [place] a call or send [place] a message through [place].  Take care, Dr Jerline Pain

## 2019-10-31 DIAGNOSIS — H0102B Squamous blepharitis left eye, upper and lower eyelids: Secondary | ICD-10-CM | POA: Diagnosis not present

## 2019-10-31 DIAGNOSIS — H0102A Squamous blepharitis right eye, upper and lower eyelids: Secondary | ICD-10-CM | POA: Diagnosis not present

## 2019-10-31 DIAGNOSIS — H26492 Other secondary cataract, left eye: Secondary | ICD-10-CM | POA: Diagnosis not present

## 2019-10-31 DIAGNOSIS — H40013 Open angle with borderline findings, low risk, bilateral: Secondary | ICD-10-CM | POA: Diagnosis not present

## 2019-10-31 DIAGNOSIS — Z961 Presence of intraocular lens: Secondary | ICD-10-CM | POA: Diagnosis not present

## 2019-12-01 DIAGNOSIS — G4733 Obstructive sleep apnea (adult) (pediatric): Secondary | ICD-10-CM | POA: Diagnosis not present

## 2020-01-09 ENCOUNTER — Telehealth: Payer: Self-pay | Admitting: Family Medicine

## 2020-01-09 NOTE — Telephone Encounter (Signed)
I left a message asking the pt to call and schedule AWV w/ Loma Sousa and CPE w/ Dr. Jerline Pain towards end of June. VDM (Dee-Dee)

## 2020-01-31 ENCOUNTER — Ambulatory Visit: Payer: Medicare Other | Admitting: Gastroenterology

## 2020-02-29 DIAGNOSIS — G4733 Obstructive sleep apnea (adult) (pediatric): Secondary | ICD-10-CM | POA: Diagnosis not present

## 2020-05-20 ENCOUNTER — Encounter: Payer: Self-pay | Admitting: Family Medicine

## 2020-05-21 ENCOUNTER — Other Ambulatory Visit: Payer: Self-pay | Admitting: *Deleted

## 2020-05-21 ENCOUNTER — Encounter: Payer: Self-pay | Admitting: Family Medicine

## 2020-05-21 NOTE — Progress Notes (Unsigned)
lab

## 2020-05-21 NOTE — Telephone Encounter (Signed)
Patient agreed with antibody clinic  Send order to clinic

## 2020-05-22 ENCOUNTER — Other Ambulatory Visit: Payer: Self-pay | Admitting: Oncology

## 2020-05-22 ENCOUNTER — Other Ambulatory Visit: Payer: Self-pay

## 2020-05-22 ENCOUNTER — Telehealth: Payer: Self-pay | Admitting: Oncology

## 2020-05-22 ENCOUNTER — Encounter: Payer: Self-pay | Admitting: Oncology

## 2020-05-22 ENCOUNTER — Telehealth (INDEPENDENT_AMBULATORY_CARE_PROVIDER_SITE_OTHER): Payer: Medicare Other | Admitting: Family Medicine

## 2020-05-22 VITALS — Wt 218.0 lb

## 2020-05-22 DIAGNOSIS — Z8679 Personal history of other diseases of the circulatory system: Secondary | ICD-10-CM | POA: Diagnosis not present

## 2020-05-22 DIAGNOSIS — U071 COVID-19: Secondary | ICD-10-CM

## 2020-05-22 DIAGNOSIS — R05 Cough: Secondary | ICD-10-CM

## 2020-05-22 DIAGNOSIS — R059 Cough, unspecified: Secondary | ICD-10-CM

## 2020-05-22 DIAGNOSIS — R509 Fever, unspecified: Secondary | ICD-10-CM | POA: Diagnosis not present

## 2020-05-22 MED ORDER — BENZONATATE 100 MG PO CAPS: 100.0000 mg | ORAL_CAPSULE | Freq: Three times a day (TID) | ORAL | 0 refills | Status: DC | PRN

## 2020-05-22 NOTE — Progress Notes (Signed)
I connected by phone with  Mr. Junkin to discuss the potential use of an new treatment for mild to moderate COVID-19 viral infection in non-hospitalized patients.   This patient is a age/sex that meets the FDA criteria for Emergency Use Authorization of casirivimab\imdevimab.  Has a (+) direct SARS-CoV-2 viral test result 1. Has mild or moderate COVID-19  2. Is ? 74 years of age and weighs ? 40 kg 3. Is NOT hospitalized due to COVID-19 4. Is NOT requiring oxygen therapy or requiring an increase in baseline oxygen flow rate due to COVID-19 5. Is within 10 days of symptom onset 6. Has at least one of the high risk factor(s) for progression to severe COVID-19 and/or hospitalization as defined in EUA. ? Specific high risk criteria : Past Medical History:  Diagnosis Date  . A-fib [facility]) with spontaneous convert to NSR 08/2012  . Adenomatous colon polyp 09/2007  . Anxiety   . Cardiac CT on 5/11, calcium score 0   . Cataracts, bilateral   . DDD (degenerative disc disease)   . ED (erectile dysfunction)   . Fibromyalgia   . Generalized OA   . GERD (gastroesophageal reflux disease)   . Myalgia and myositis   . OSA (obstructive sleep apnea), compliant with CPAP   . Sleep apnea    cpap  . Vitamin D deficiency   ?   Symptom onset  03/14/2020   I have spoken and communicated the following to the patient or parent/caregiver:   1. FDA has authorized the emergency use of casirivimab\imdevimab for the treatment of mild to moderate COVID-19 in adults and pediatric patients with positive results of direct SARS-CoV-2 viral testing who are 63 years of age and older weighing at least 40 kg, and who are at high risk for progressing to severe COVID-19 and/or hospitalization.   2. The significant known and potential risks and benefits of casirivimab\imdevimab, and the extent to which such potential risks and benefits are unknown.   3. Information on available alternative treatments and the risks and  benefits of those alternatives, including clinical trials.   4. Patients treated with casirivimab\imdevimab should continue to self-isolate and use infection control measures (e.g., wear mask, isolate, social distance, avoid sharing personal items, clean and disinfect "high touch" surfaces, and frequent handwashing) according to CDC guidelines.    5. The patient or parent/caregiver has the option to accept or refuse casirivimab\imdevimab .   After reviewing this information with the patient, The patient agreed to proceed with receiving casirivimab\imdevimab infusion and will be provided a copy of the Fact sheet prior to receiving the infusion.Rulon Abide, AGNP-C [phone] ([place])

## 2020-05-22 NOTE — Progress Notes (Signed)
Virtual Visit via Video Note  I connected with [place]  on 05/22/20 at 10:20 AM EDT by a video enabled telemedicine application and verified that I am speaking with the correct person using two identifiers.  Location patient: home Location provider:work or home office Persons participating in the virtual visit: patient, provider, wife  I discussed the limitations of evaluation and management by telemedicine and the availability of in person appointments. The patient expressed understanding and agreed to proceed.   HPI:  Acute visit for COVID19: -started to feel sick Sep 7th -had a positive COVID19 test on the 13th -symptoms have included fever, nasal congestion, some mild SOB - stable, cough, nausea -wife is retired Scientist, clinical (histocompatibility and immunogenetics) and reports she listened to his lung, no wheezing or rhonchi and breathing well -able to get out of bed, eating and drinking well -temp is 101 today -O2 has remained 96 -denies history of lung disease, immunocompromise -hx of rare A. Fib, last episode 9 years ago; history htn per chart, but not on meds -wt is 218; has been trying to lose weight -has not had the covid19 vaccine   ROS: See pertinent positives and negatives per HPI.  Past Medical History:  Diagnosis Date  . A-fib [facility]) with spontaneous convert to NSR 08/2012  . Adenomatous colon polyp 09/2007  . Anxiety   . Cardiac CT on 5/11, calcium score 0   . Cataracts, bilateral   . DDD (degenerative disc disease)   . ED (erectile dysfunction)   . Fibromyalgia   . Generalized OA   . GERD (gastroesophageal reflux disease)   . Myalgia and myositis   . OSA (obstructive sleep apnea), compliant with CPAP   . Sleep apnea    cpap  . Vitamin D deficiency     Past Surgical History:  Procedure Laterality Date  . CATARACT EXTRACTION Right 02/2019  . CHONDROPLASTY Left   . COLONOSCOPY    . LIPOMA EXCISION    . LUMBAR LAMINECTOMY    . POLYPECTOMY    . ROTATOR CUFF REPAIR Right     Family History   Problem Relation Age of Onset  . Breast cancer Mother   . Diabetes Maternal Aunt   . Heart attack Father 45  . Colon cancer Neg Hx   . Colon polyps Neg Hx   . Rectal cancer Neg Hx   . Stomach cancer Neg Hx   . Esophageal cancer Neg Hx     SOCIAL HX: see hpi  Current Outpatient Medications:  .  aspirin (ASPIRIN 81) 81 MG chewable tablet, Chew 81 mg by mouth daily. , Disp: , Rfl:  .  benzonatate (TESSALON PERLES) 100 MG capsule, Take 1 capsule (100 mg total) by mouth 3 (three) times daily as needed., Disp: 20 capsule, Rfl: 0 .  Cholecalciferol (VITAMIN D) 125 MCG (5000 UT) CAPS, Take 1 capsule by mouth daily., Disp: , Rfl:  .  Coenzyme Q10 (COQ10) 100 MG CAPS, Take by mouth., Disp: , Rfl:  .  Famotidine-Ca Carb-Mag Hydrox (PEPCID COMPLETE PO), Take 10 mg by mouth 2 (two) times daily., Disp: , Rfl:  .  mineral oil liquid, Take 15 mLs by mouth at bedtime as needed for moderate constipation., Disp: , Rfl:  .  Multiple Vitamin (MULTIVITAMIN WITH MINERALS) TABS tablet, Take 1 tablet by mouth daily., Disp: , Rfl:  .  tadalafil (CIALIS) 5 MG tablet, Take 1 tablet (5 mg total) by mouth daily as needed. Erectile dysfunction, Disp: 10 tablet, Rfl: 0 .  Zinc  30 MG CAPS, Take by mouth., Disp: , Rfl:   EXAM:  VITALS per patient if applicable: O2 16% T 109  GENERAL: alert, oriented, appears well and in no acute distress  HEENT: atraumatic, conjunttiva clear, no obvious abnormalities on inspection of external nose and ears  NECK: normal movements of the head and neck  LUNGS: on inspection no signs of respiratory distress, breathing rate appears normal, no obvious gross SOB, gasping or wheezing  CV: no obvious cyanosis  MS: moves all visible extremities without noticeable abnormality  PSYCH/NEURO: pleasant and cooperative, no obvious depression or anxiety, speech and thought processing grossly intact  ASSESSMENT AND PLAN:  Discussed the following assessment and plan:  Disease due to  2019 novel coronavirus  Cough  Fever, unspecified fever cause  Morbid obesity (HCC)  History of heart disease  -we discussed possible serious and likely etiologies, options for evaluation and workup, limitations of telemedicine visit vs in person visit, treatment, treatment risks and precautions. Pt prefers to treat via telemedicine empirically rather then risking or undertaking an in person visit at this moment. Discussed treatment options, including MAB, potential complications, home care options, risks, home isolation. They do wish to pursue MAB - sent message to [facility] and provided pt with # to call. Offered follow up with PCP or telemed - they declined for now.  Advised to seek prompt follow up telemedicine visit or in person care if worsening, new symptoms arise, or if is not improving with treatment.   I discussed the assessment and treatment plan with the patient. The patient was provided an opportunity to ask questions and all were answered. The patient agreed with the plan and demonstrated an understanding of the instructions.     Lucretia Kern, DO

## 2020-05-22 NOTE — Patient Instructions (Signed)
-  stay home while sick, and if you have [place] please stay home for a full 10 days since the onset of symptoms PLUS one day of no fever and feeling better  -I sent the medication(s) we discussed to your pharmacy: Meds ordered this encounter  Medications  . benzonatate (TESSALON PERLES) 100 MG capsule    Sig: Take 1 capsule (100 mg total) by mouth 3 (three) times daily as needed.    Dispense:  20 capsule    Refill:  0    -call number provided for outpatient COVID19 treatment center [phone]  -can use tylenol if needed for fevers, aches and pains per instructions  -can use nasal saline a few times per day if nasal congestion, sometime a short course of Afrin nasal spray for 3 days can help as well  -stay hydrated, drink plenty of fluids and eat small healthy meals - avoid dairy  -can take 1000 IU Vit D3 and Vit C lozenges per instructions  -check out the CDC website for more information on home care, transmission and treatment for COVID19  -follow up with your doctor in 2-3 days unless improving and feeling better  I hope you are feeling better soon! Seek in-person care or a follow up telemedicine visit promptly if your symptoms worsen, new concerns arise or you are not improving as expected. Call 911 if severe symptoms.

## 2020-05-22 NOTE — Telephone Encounter (Signed)
I connected by phone with Joseph Mcdaniel  to discuss the potential use of an new treatment for mild to moderate COVID-19 viral infection in non-hospitalized patients.   This patient is a age/sex that meets the FDA criteria for Emergency Use Authorization of casirivimab\imdevimab.  Has a (+) direct SARS-CoV-2 viral test result 1. Has mild or moderate COVID-19  2. Is ? 73 years of age and weighs ? 40 kg 3. Is NOT hospitalized due to COVID-19 4. Is NOT requiring oxygen therapy or requiring an increase in baseline oxygen flow rate due to COVID-19 5. Is within 10 days of symptom onset 6. Has at least one of the high risk factor(s) for progression to severe COVID-19 and/or hospitalization as defined in EUA. Specific high risk criteria : Past Medical History:  Diagnosis Date  . A-fib [facility]) with spontaneous convert to NSR 08/2012  . Adenomatous colon polyp 09/2007  . Anxiety   . Cardiac CT on 5/11, calcium score 0   . Cataracts, bilateral   . DDD (degenerative disc disease)   . ED (erectile dysfunction)   . Fibromyalgia   . Generalized OA   . GERD (gastroesophageal reflux disease)   . Myalgia and myositis   . OSA (obstructive sleep apnea), compliant with CPAP   . Sleep apnea    cpap  . Vitamin D deficiency   ?  ?    Symptom onset  05/15/2020   I have spoken and communicated the following to the patient or parent/caregiver:   1. FDA has authorized the emergency use of casirivimab\imdevimab for the treatment of mild to moderate COVID-19 in adults and pediatric patients with positive results of direct SARS-CoV-2 viral testing who are 78 years of age and older weighing at least 40 kg, and who are at high risk for progressing to severe COVID-19 and/or hospitalization.   2. The significant known and potential risks and benefits of casirivimab\imdevimab, and the extent to which such potential risks and benefits are unknown.   3. Information on available alternative treatments and the risks and  benefits of those alternatives, including clinical trials.   4. Patients treated with casirivimab\imdevimab should continue to self-isolate and use infection control measures (e.g., wear mask, isolate, social distance, avoid sharing personal items, clean and disinfect "high touch" surfaces, and frequent handwashing) according to CDC guidelines.    5. The patient or parent/caregiver has the option to accept or refuse casirivimab\imdevimab .   After reviewing this information with the patient, The patient agreed to proceed with receiving casirivimab\imdevimab infusion and will be provided a copy of the Fact sheet prior to receiving the infusion.Rulon Abide, AGNP-C [phone] ([place])

## 2020-05-23 ENCOUNTER — Other Ambulatory Visit (HOSPITAL_COMMUNITY): Payer: Self-pay

## 2020-05-23 ENCOUNTER — Ambulatory Visit (HOSPITAL_COMMUNITY)
Admission: RE | Admit: 2020-05-23 | Discharge: 2020-05-23 | Disposition: A | Discharge status: Home / Self Care | Payer: Medicare Other | Source: Ambulatory Visit | Attending: Pulmonary Disease | Admitting: Pulmonary Disease

## 2020-05-23 DIAGNOSIS — U071 COVID-19: Secondary | ICD-10-CM | POA: Diagnosis present

## 2020-05-23 DIAGNOSIS — Z23 Encounter for immunization: Secondary | ICD-10-CM | POA: Diagnosis not present

## 2020-05-23 MED ORDER — METHYLPREDNISOLONE SODIUM SUCC 125 MG IJ SOLR: 125.0000 mg | Freq: Once | INTRAMUSCULAR | Status: DC | PRN

## 2020-05-23 MED ORDER — DIPHENHYDRAMINE HCL 50 MG/ML IJ SOLN: 50.0000 mg | Freq: Once | INTRAMUSCULAR | Status: DC | PRN

## 2020-05-23 MED ORDER — SODIUM CHLORIDE 0.9 % IV SOLN: INTRAVENOUS | Status: DC | PRN

## 2020-05-23 MED ORDER — SODIUM CHLORIDE 0.9 % IV SOLN
1200.0000 mg | Freq: Once | INTRAVENOUS | Status: AC
  Administered 2020-05-23: 1200 mg via INTRAVENOUS
  Filled 2020-05-23: qty 10

## 2020-05-23 MED ORDER — FAMOTIDINE IN NACL 20-0.9 MG/50ML-% IV SOLN: 20.0000 mg | Freq: Once | INTRAVENOUS | Status: DC | PRN

## 2020-05-23 MED ORDER — ALBUTEROL SULFATE HFA 108 (90 BASE) MCG/ACT IN AERS: 2.0000 | INHALATION_SPRAY | Freq: Once | RESPIRATORY_TRACT | Status: DC | PRN

## 2020-05-23 MED ORDER — EPINEPHRINE 0.3 MG/0.3ML IJ SOAJ: 0.3000 mg | Freq: Once | INTRAMUSCULAR | Status: DC | PRN

## 2020-05-23 NOTE — Discharge Instructions (Signed)

## 2020-05-23 NOTE — Progress Notes (Signed)
  Diagnosis: COVID-19  Physician: Dr Joya Gaskins  Procedure: Covid Infusion Clinic Med: casirivimab\imdevimab infusion - Provided patient with casirivimab\imdevimab fact sheet for patients, parents and caregivers prior to infusion.  Complications: No immediate complications noted.  Discharge: Discharged home   Sim Boast 05/23/2020

## 2020-05-24 ENCOUNTER — Encounter: Payer: Self-pay | Admitting: Family Medicine

## 2020-05-25 ENCOUNTER — Telehealth: Payer: Self-pay | Admitting: Family Medicine

## 2020-05-25 ENCOUNTER — Encounter: Payer: Self-pay | Admitting: Family Medicine

## 2020-05-25 ENCOUNTER — Telehealth (INDEPENDENT_AMBULATORY_CARE_PROVIDER_SITE_OTHER): Payer: Medicare Other | Admitting: Family Medicine

## 2020-05-25 DIAGNOSIS — U071 COVID-19: Secondary | ICD-10-CM

## 2020-05-25 MED ORDER — FLOVENT DISKUS 250 MCG/BLIST IN AEPB: 500.0000 ug | INHALATION_SPRAY | Freq: Two times a day (BID) | RESPIRATORY_TRACT | 0 refills | Status: DC

## 2020-05-25 MED ORDER — FLOVENT HFA 220 MCG/ACT IN AERO: 2.0000 | INHALATION_SPRAY | Freq: Two times a day (BID) | RESPIRATORY_TRACT | 12 refills | Status: DC

## 2020-05-25 NOTE — Progress Notes (Signed)
Phone [phone] Virtual visit via Video note   Subjective:  Chief complaint: Chief Complaint  Patient presents with  . Covid Exposure   This visit type was conducted due to national recommendations for restrictions regarding the COVID-19 Pandemic (e.g. social distancing).  This format is felt to be most appropriate for this patient at this time balancing risks to patient and risks to population by having him in for in person visit.  No physical exam was performed (except for noted visual exam or audio findings with Telehealth visits).    Our team/I connected with Maxie Barb at 11:40 AM EDT by a video enabled telemedicine application (doxy.me or caregility through epic) and verified that I am speaking with the correct person using two identifiers.  Location patient: Home-[place] Location provider: [facility], office Persons participating in the virtual visit:  patient, wife  Our team/I discussed the limitations of evaluation and management by telemedicine and the availability of in person appointments. In light of current covid-19 pandemic, patient also understands that we are trying to protect them by minimizing in office contact if at all possible.  The patient expressed consent for telemedicine visit and agreed to proceed. Patient understands insurance will be billed.   Past Medical History-  Patient Active Problem List   Diagnosis Date Noted  . Erectile dysfunction 07/11/2019  . Dilated cardiomyopathy ([place]) 04/27/2018  . Osteoarthritis of both knees 03/03/2018  . Essential hypertension 01/05/2018  . PAF (paroxysmal atrial fibrillation) ([place]) 02/13/2016  . Cannabis dependence, episodic use ([place]) 08/31/2012  . H/O adenomatous polyp of colon 10/29/2011  . OSA on CPAP 04/03/2008    Medications- reviewed and updated Current Outpatient Medications  Medication Sig Dispense Refill  . aspirin (ASPIRIN 81) 81 MG chewable tablet Chew 81 mg by mouth daily.     . benzonatate  (TESSALON PERLES) 100 MG capsule Take 1 capsule (100 mg total) by mouth 3 (three) times daily as needed. 20 capsule 0  . Cholecalciferol (VITAMIN D) 125 MCG (5000 UT) CAPS Take 1 capsule by mouth daily.    . Coenzyme Q10 (COQ10) 100 MG CAPS Take by mouth.    . Famotidine-Ca Carb-Mag Hydrox (PEPCID COMPLETE PO) Take 10 mg by mouth 2 (two) times daily.    . mineral oil liquid Take 15 mLs by mouth at bedtime as needed for moderate constipation.    . Multiple Vitamin (MULTIVITAMIN WITH MINERALS) TABS tablet Take 1 tablet by mouth daily.    . tadalafil (CIALIS) 5 MG tablet Take 1 tablet (5 mg total) by mouth daily as needed. Erectile dysfunction 10 tablet 0  . Zinc 30 MG CAPS Take by mouth.        Objective:  no self reported vitals outside of oxygen ranging from 93-95%  Gen: NAD, appears fatigued Lungs: nonlabored, normal respiratory rate  Skin: appears dry, no obvious rash     Assessment and Plan  #Covid 19 positive.  S: States he feels short of breath- short of breath walking across room where normally could walk half mile- shortness of breath is improving. Tested postiive for covid on Monday. Got mAB infusion on Wednesday- felt worse yesterday and seems slightly better today. Having less cough today- sats seemed to drop yesterday when coughign a lot (related to coughing spells). CPAP also helped sats last night.   Patient's wife mentioned that the patient's 02 stat ranges from 93-95.  Wife is a retired Statistician- she listened to his lungs yesterday and noted some wheeze in upper  lobes - today sound clear.  No shortness of breath at rest  No leg swelling or calf pain.  Patient is taking aspirin 81 mg daily. A/P: Patient with COVID-19 and seems to be improving today 2 days out from monoclonal antibody infusion-symptoms had worsened yesterday with oxygen saturations as low as 88% before chest percussion by wife and use of CPAP-today oxygen levels have been 93 to 95%.  We discussed  approximate level state 93% or below persistently would recommend him going to the hospital.  I am hoping patient is to continue on an upward trajectory.  We discussed possible inhaled steroids based off information from up-to-date below.  Patient is interested in trialing this despite not being early in his illness-day 11 today.  Pulmicort did not appear to be covered by insurance so we trialed Flovent.  I asked him to update [place] next week if not starting to improve further or to seek care immediately if worsening symptoms     From up-to-date "Inhaled glucocorticoids - In trials evaluating inhaled glucocorticoids, there was some benefit in the treatment of mild, early, COVID-19, although no mortality reduction was demonstrated.  .In the non-placebo-controlled steroids in COVID-19 (STOIC) trial, 139 adult outpatients with mild, early COVID-19 were treated with inhaled budesonide 800 mcg twice daily (an average of seven days) or assigned to usual care [121]. Among those treated with inhaled budesonide, fewer patients required urgent medical evaluation or hospitalization (1.4 versus 14.4 percent) at 28 days.  .In a subsequent open-label trial (the PRINCIPLE trial) including 1856 COVID-19 outpatients ?73 years old or ?73 years old with risk factors for severe disease, treatment with inhaled budesonide 800 mcg twice daily reduced the time to self-reported recovery (12 versus 15 days; 95% CI 1.2-5.1) but did not reduce the risk of hospitalization or death at 28 days compared with usual care [122].  Despite a suggestion of benefit, additional randomized controlled trials are necessary to determine the efficacy of inhaled corticosteroids for outpatients with early, mild COVID-19"  Recommended follow up: As needed for acute concerns   Lab/Order associations:   ICD-10-CM   1. COVID-19  U07.1     Meds ordered this encounter  Medications  . Fluticasone Propionate, Inhal, (FLOVENT DISKUS) 250 MCG/BLIST  AEPB    Sig: Inhale 500 mcg into the lungs in the morning and at bedtime.    Dispense:  60 each    Refill:  0   Return precautions advised.  Garret Reddish, MD

## 2020-05-25 NOTE — Telephone Encounter (Signed)
Left voicemail that I sent in to requested [facility]

## 2020-05-25 NOTE — Telephone Encounter (Signed)
Spoke with patient wife. Stated patient had a restless night. After using cpap 02 sat increase  This morning patient still with cough, congestion decrease, O2 Sat 95% No taking medication will pick up prescribed med today Please advise

## 2020-05-25 NOTE — Telephone Encounter (Signed)
Pt wife called stating no pharmacy is carrying Flovent 250 mcg at the time. [facility] [place] [facility] [address] [facility] Flovent of 220 mcg.   Wife asked if Dr. Yong Channel could send prescription of that dosage to pharmacy. Please advise.

## 2020-05-25 NOTE — Telephone Encounter (Signed)
Per Dr Yong Channel if O2 sat stay 93 or lower recommended  go to ED, schedule appointment today.  Video visit schedule patient wife aware

## 2020-05-25 NOTE — Patient Instructions (Signed)
Health Maintenance Due  Topic Date Due  . Hepatitis C Screening  Never done  . COVID-19 Vaccine (1) Never done  . INFLUENZA VACCINE  04/08/2020    Depression screen [facility] 2/9 10/11/2019 07/11/2019 12/22/2018  Decreased Interest 0 0 0  Down, Depressed, Hopeless 0 0 0  PHQ - 2 Score 0 0 0    Recommended follow up: No follow-ups on file.

## 2020-05-25 NOTE — Progress Notes (Signed)
Telephone from triage nurse: pt cannot get flovent diskus 250 bid as ordered but pharmacy does have flovent hfa 217mcg; breathing is stable. But doe.  Reviewed note from today.   Ordered flovent 220 mcg inh bid and albuterol mdi 2 puff q4-6 prn; verbal order to nurse who will call it in.  To ER or UC if worsens as recommended.

## 2020-05-25 NOTE — Addendum Note (Signed)
Addended by: Marin Olp on: 05/25/2020 10:03 PM   Modules accepted: Orders

## 2020-05-27 ENCOUNTER — Encounter: Payer: Self-pay | Admitting: Family Medicine

## 2020-05-28 ENCOUNTER — Encounter: Payer: Self-pay | Admitting: Family Medicine

## 2020-05-28 ENCOUNTER — Telehealth: Payer: Self-pay

## 2020-05-28 ENCOUNTER — Telehealth: Payer: Self-pay | Admitting: Family Medicine

## 2020-05-28 NOTE — Telephone Encounter (Signed)
Jerline Pain is unable to see this pt because he is full.  ( see patient message on chart review).  Could Dr. Jonni Sanger listen to his lungs in his car?

## 2020-05-28 NOTE — Telephone Encounter (Signed)
Reason for Call Symptomatic / Request for Health Information Initial Comment Caller says Dr. Yong Channel ordered her husband a Flovent Inhaler, says he has Covid and says the pharmacy does not have it but she found a new pharmacy that has it but no one has called back from the office Additional Comment [facility] [address] [place], [place] Translation No Nurse Assessment Nurse: Enid Derry, RN, Manuela Schwartz Date/Time Eilene Ghazi Time): 05/25/2020 5:43:12 PM Confirm and document reason for call. If symptomatic, describe symptoms. ---Caller says Dr. Yong Channel ordered her husband a Flovent 250 mcg Inhaler, says he has Covid and says the pharmacy does not have it, but she found a new pharmacy that has it Flovent 220 mcg. He is not having any increased difficulty breathing now, same as when he was seen at the virtual visit the AM. Has the patient had close contact with a person known or suspected to have the novel coronavirus illness OR traveled / lives in area with major community spread (including international travel) in the last 14 days from the onset of symptoms? * If Asymptomatic, screen for exposure and travel within the last 14 days. ---Yes Does the patient have any new or worsening symptoms? ---Yes Will a triage be completed? ---Yes Related visit to physician within the last 2 weeks? ---Yes Does the PT have any chronic conditions? (i.e. diabetes, asthma, this includes High risk factors for pregnancy, etc.) ---Yes List chronic conditions. ---A-fib (last episode 5-6 years ago) Is this a behavioral health or substance abuse call? ---NoPLEASE NOTE: All timestamps contained within this report are represented as [place] Standard Time. CONFIDENTIALTY NOTICE: This fax transmission is intended only for the addressee. It contains information that is legally privileged, confidential or otherwise protected from use or disclosure. If you are not the intended recipient, you are strictly prohibited from  reviewing, disclosing, copying using or disseminating any of this information or taking any action in reliance on or regarding this information. If you have received this fax in error, please notify us immediately by telephone so that we can arrange for its return to [place]. Phone: [phone], Toll-Free: [phone], Fax: [phone] Page: 2 of 4 Call Id: [id number] Nurse Assessment Nurse: Enid Derry RN, Manuela Schwartz Date/Time Eilene Ghazi Time): 05/25/2020 5:51:43 PM Please select the assessment type ---Verbal order / New medication order Additional Documentation ---Patient was seen virtually this morning by Dr. Yong Channel; dx with Covid, and prescribed Flovent 233mcg inhaler. The pharmacy states they don't have any Flovent. Caller found a different pharmacy, but they only have the Flovent 276mcg inhaler. Patient is SOB with activity,, and they need to get this steroid inhaler. Does the client directives allow for assistance with medications after hours? ---Yes Other current medications? ---Unknown Medication allergies? ---Unknown Pharmacy [facility] and phone [facility]. [phone], [place], [place]) Does the client directive allow for RN to call in the medication order to the pharmacy? ---Yes Additional Documentation ---per client directive: For questions not able to be managed above, only page on call physician if; per your nursing judgement this cannot wait until the next business day. Guidelines Guideline Title Affirmed Question Affirmed Notes Nurse Date/Time Eilene Ghazi Time) Breathing Difficulty [1] MILD difficulty breathing (e.g., minimal/ no SOB at rest, SOB with walking, pulse <100) AND [2] NEW-onset or WORSE than normal Enid Derry, Charlean Sanfilippo 0/03/6225 5:47:18 PM Disp. Time Eilene Ghazi Time) Disposition Final User 05/25/2020 5:41:23 PM Send to Urgent Jethro Bolus 05/25/2020 6:00:23 PM Called On-Call Provider Enid Derry, RN, Manuela Schwartz 3/33/5456 6:16:00 PM Pharmacy Call  Enid Derry, Lyman,  Manuela Schwartz Reason: [facility] in [facility] 05/25/2020 5:50:44 PM See HCP within 4 Hours (or PCP triage) Yes Enid Derry, RN, Edwena Bunde Disagree/Comply Disagree Caller Understands Yes PreDisposition Call Doctor Care Advice Given Per Guideline SEE HCP (OR PCP TRIAGE) WITHIN 4 HOURS: * IF OFFICE WILL BE OPEN: You need to be seen within the next 3 or 4 hours. Call your doctor (or NP/PA) now or as soon as the office opens. CALL BACK IF: * You become worse CARE ADVICE given per Breathing Difficulty (Adult) guideline.PLEASE NOTE: All timestamps contained within this report are represented as [place] Standard Time. CONFIDENTIALTY NOTICE: This fax transmission is intended only for the addressee. It contains information that is legally privileged, confidential or otherwise protected from use or disclosure. If you are not the intended recipient, you are strictly prohibited from reviewing, disclosing, copying using or disseminating any of this information or taking any action in reliance on or regarding this information. If you have received this fax in error, please notify us immediately by telephone so that we can arrange for its return to [place]. Phone: [phone], Toll-Free: [phone], Fax: [phone] Page: 3 of 4 Call Id: [id number] Verbal Orders/Maintenance Medications Medication Refill Route Dosage Regime Duration Admin Instructions User Name Flovent 220 mcg inhaler Inhaler 1 puff twice per day 30 Days Inhale 1 puff twice daily Feliciano, RN, Manuela Schwartz albuterol inhaler Inhaler 2 puffs every 4 to 6 hours as needed for wheezing 30 Days Inhale 2 puffs every 4 to 6 hours as needed for wheezing Enid Derry, RN, Manuela Schwartz Comments User: Marvell Fuller, RN Date/Time Eilene Ghazi Time): 05/25/2020 5:51:42 PM Caller states patient was already seen this morning, and they just need to be able to get the Flovent inhaler Rx straightened out. Referrals GO TO FACILITY  REFUSED Paging DoctorName Phone DateTime Result/Outcome Message Type Notes Billey Chang- MD [phone] 05/25/2020 6:00:23 PM Called On Call Provider - Reached Doctor Paged Billey Chang- MD 05/25/2020 6:19:01 PM Spoke with On Call - General Message Result Dr. Jonni Sanger gave orders for Flovent 220 mcg inhaler twice daily; and also for Albuterol inhaler, 2 puffs Q 4-6 hr prn wheezing; remind patient to watch O2 sats and if goes to 90% should be seen in ED, per instructions from virtual visit this morning. Nurse to call in prescriptions to pharmacy. Returned call to patient's wife and advised [facility] [address] She will check with pharmacy in a few minutes, and has the paperwork instructions for checking his O2 sats. She is a respiratory therapist

## 2020-05-28 NOTE — Telephone Encounter (Signed)
Please can you schedule him with any provider available here or another office thanks

## 2020-05-28 NOTE — Telephone Encounter (Signed)
Pt is covid +. He is wanting his lungs checked to listen for covid pneumonia. Is anyone willing to do this?

## 2020-05-29 ENCOUNTER — Other Ambulatory Visit: Payer: Self-pay

## 2020-05-29 ENCOUNTER — Telehealth: Payer: Self-pay | Admitting: Family Medicine

## 2020-05-29 ENCOUNTER — Ambulatory Visit (INDEPENDENT_AMBULATORY_CARE_PROVIDER_SITE_OTHER): Payer: Medicare Other | Admitting: Family Medicine

## 2020-05-29 VITALS — BP 118/63 | HR 66 | Temp 98.6°F | Ht 70.0 in

## 2020-05-29 DIAGNOSIS — U071 COVID-19: Secondary | ICD-10-CM | POA: Diagnosis not present

## 2020-05-29 MED ORDER — AZITHROMYCIN 250 MG PO TABS: ORAL_TABLET | ORAL | 0 refills | Status: DC

## 2020-05-29 MED ORDER — PREDNISONE 50 MG PO TABS: ORAL_TABLET | ORAL | 0 refills | Status: DC

## 2020-05-29 NOTE — Telephone Encounter (Signed)
Patient aware of appointment, notified to call in for check in, will come form back door. Last day with temperature was on Saturday 99.9 F. Today is 96.3 F

## 2020-05-29 NOTE — Telephone Encounter (Signed)
Please advise 

## 2020-05-29 NOTE — Telephone Encounter (Signed)
Spoke with patient wife. Let her know we have a appointment virtual at 11:00. If Possible we can change it to in office.  Mrs Sandi Mariscal stated patient has no temperature.

## 2020-05-29 NOTE — Telephone Encounter (Signed)
See preview message  Please advise

## 2020-05-29 NOTE — Telephone Encounter (Signed)
Pt scheduled for ov today.

## 2020-05-29 NOTE — Telephone Encounter (Signed)
Initial Comment Caller has tried to reach the office since 8am. She sent a message last night because her husband has had COVID for 2 weeks. She states that he needs to have his lungs listened to. The office denied seeing him in the office. She states that Janett Billow told her she would send an email to the drs to see if they can come out and visit him. She has not heard back. She doesn't want to take him to the ER. Over the weekend his sats were not the best. His sats are up today but she is concerned with COVID pneumonia. She is a retired Statistician. Translation No Nurse Assessment Nurse: Waymond Cera, RN, Benjamine Mola Date/Time (Eastern Time): 05/28/2020 5:23:59 PM Confirm and document reason for call. If symptomatic, describe symptoms. ---Wife states that husband has had Covid for 2 weeks. States that she is concerned about PNE. States has had wheezing and productive cough. Has the patient had close contact with a person known or suspected to have the novel coronavirus illness OR traveled / lives in area with major community spread (including international travel) in the last 14 days from the onset of symptoms? * If Asymptomatic, screen for exposure and travel within the last 14 days. ---Yes Does the patient have any new or worsening symptoms? ---Yes Will a triage be completed? ---Yes Related visit to physician within the last 2 weeks? ---Yes Does the PT have any chronic conditions? (i.e. diabetes, asthma, this includes High risk factors for pregnancy, etc.) ---Yes List chronic conditions. ---Sleep apnea A. Fib Is this a behavioral health or substance abuse call? ---NoPLEASE NOTE: All timestamps contained within this report are represented as [place] Standard Time. CONFIDENTIALTY NOTICE: This fax transmission is intended only for the addressee. It contains information that is legally privileged, confidential or otherwise protected from use or disclosure. If you are not the  intended recipient, you are strictly prohibited from reviewing, disclosing, copying using or disseminating any of this information or taking any action in reliance on or regarding this information. If you have received this fax in error, please notify us immediately by telephone so that we can arrange for its return to [place]. Phone: [phone], Toll-Free: [phone], Fax: [phone] Page: 2 of 2 Call Id: [id number] Guidelines Guideline Title Affirmed Question Affirmed Notes Nurse Date/Time Eilene Ghazi Time) COVID-19 - Diagnosed or Suspected MODERATE difficulty breathing (e.g., speaks in phrases, SOB even at rest, pulse 100-120) Cantrell, RN, Benjamine Mola 05/28/2020 5:27:58 PM Disp. Time Eilene Ghazi Time) Disposition Final User 05/28/2020 5:35:29 PM Go to ED Now Yes Cantrell, RN, Lorin Glass Disagree/Comply Disagree Caller Understands Yes PreDisposition InappropriateToAsk Care Advice Given Per Guideline GO TO ED NOW: * You need to be seen in the Emergency Department. * Leave now. Drive carefully. CALL EMS 911 IF: * Severe difficulty breathing occurs * Lips or face turns blue * Confusion occurs. CARE ADVICE given per COVID-19 - DIAGNOSED OR SUSPECTED (Adult) guideline. Comments User: Sharol Given, RN Date/Time Eilene Ghazi Time): 05/28/2020 5:35:28 PM Wife states that they are not going to the ER. States that she has enough knowledge to know when to take him to the ER. States that she has been trying to get husband seen all day. States that she has sent messages and called. Wife is upset because she was told that they are not seeing sick patients in the office. States that someone told her today that they will call her back about getting him seen in the car and no one ever called back. Referrals GO TO  FACILITY REFUSED

## 2020-05-29 NOTE — Progress Notes (Signed)
   Joseph Mcdaniel is a 74 y.o. male who presents today for an office visit.  Assessment/Plan:  New/Acute Problems: COVID 19 Seems to be on the tail end of symptoms though still having some respiratory symptoms.  Will start prednisone burst and Z-Pak.  Continue Flovent.  Encourage good oral hydration.  Continue activity as tolerated.  Vitals today are stable-do not think he will need hospitalization or emergent care at this point.     Subjective:  HPI:  Patient here for Covid follow-up.  Was diagnosed with Covid 8 days ago but has had symptoms starting 14 days ago.  Had virtual visit on 9/14.  Had antibody infusion 6 days ago.  Had another virtual visit last week.  Fever has subsided.  Wife is respiratory therapist and is concerned about possible wheezing.  He has been taking Flovent for the past few days.  Not sure if it is helping.  Has some shortness of breath on exertion.  Feels okay at rest.  No chest pain.  Has had increasing fatigue.  Occasional cough.  Has not had any fever for the past few days.         Objective:  Physical Exam: BP 118/63   Pulse 66   Temp 98.6 F (37 C) (Oral)   Ht 5\' 10"  (1.778 m)   SpO2 93%   BMI 31.28 kg/m   Gen: No acute distress, resting comfortably CV: Regular rate and rhythm with no murmurs appreciated Pulm: Slightly increased work of breathing.  Slight tachypnea.  Bibasilar crackles with faint expiratory wheeze. Neuro: Grossly normal, moves all extremities Psych: Normal affect and thought content  Time Spent: 35 minutes of total time was spent on the date of the encounter performing the following actions: chart review prior to seeing the patient including his 2 recent virtual visits with other healthcare providers, obtaining history, performing a medically necessary exam, counseling on the treatment plan, placing orders, and documenting in our EHR.         Algis Greenhouse. Jerline Pain, MD 05/29/2020 11:18 AM

## 2020-05-29 NOTE — Telephone Encounter (Signed)
Pt had appointment today with PCP

## 2020-05-29 NOTE — Telephone Encounter (Signed)
Pt seen by PCP

## 2020-05-30 ENCOUNTER — Other Ambulatory Visit: Payer: Self-pay | Admitting: Family Medicine

## 2020-05-30 DIAGNOSIS — G4733 Obstructive sleep apnea (adult) (pediatric): Secondary | ICD-10-CM | POA: Diagnosis not present

## 2020-06-08 ENCOUNTER — Other Ambulatory Visit: Payer: Self-pay

## 2020-06-08 ENCOUNTER — Ambulatory Visit (INDEPENDENT_AMBULATORY_CARE_PROVIDER_SITE_OTHER): Payer: Medicare Other | Admitting: Family Medicine

## 2020-06-08 ENCOUNTER — Encounter: Payer: Self-pay | Admitting: Family Medicine

## 2020-06-08 VITALS — BP 114/78 | HR 65 | Temp 98.0°F | Ht 70.0 in | Wt 202.0 lb

## 2020-06-08 DIAGNOSIS — U071 COVID-19: Secondary | ICD-10-CM

## 2020-06-08 DIAGNOSIS — R399 Unspecified symptoms and signs involving the genitourinary system: Secondary | ICD-10-CM

## 2020-06-08 DIAGNOSIS — R059 Cough, unspecified: Secondary | ICD-10-CM | POA: Diagnosis not present

## 2020-06-08 MED ORDER — PROMETHAZINE-DM 6.25-15 MG/5ML PO SYRP: 5.0000 mL | ORAL_SOLUTION | Freq: Four times a day (QID) | ORAL | 0 refills | Status: DC | PRN

## 2020-06-08 NOTE — Patient Instructions (Signed)
It was very nice to see you today!  Please start the cough syrup.  Your lungs are clear.  I think your body is still recovering from covid.  We will check a urine sample.  Let me know if your sympoms do not continue to improve over the next several weeks.   Take care, Dr Jerline Pain  Please try these tips to maintain a healthy lifestyle:   Eat at least 3 REAL meals and 1-2 snacks per day.  Aim for no more than 5 hours between eating.  If you eat breakfast, please do so within one hour of getting up.    Each meal should contain half fruits/vegetables, one quarter protein, and one quarter carbs (no bigger than a computer mouse)   Cut down on sweet beverages. This includes juice, soda, and sweet tea.     Drink at least 1 glass of water with each meal and aim for at least 8 glasses per day   Exercise at least 150 minutes every week.

## 2020-06-08 NOTE — Progress Notes (Signed)
   Joseph Mcdaniel is a 74 y.o. male who presents today for an office visit.  Assessment/Plan:  New/Acute Problems: Cough / COVID 19 Still has lingering cough but no red flags today.  Has a bit of back pain that is likely musculoskeletal.  Wells score 0 - doubt PE.  He is already on Flovent and albuterol.  Discussed conservative measures and to go course of recovery.  We will send in promethazine-dextromethorphan cough syrup.  He will let me know if he does not continue to gradually improve over the next several weeks.  LUTS Likely sequela of COVID-19 but will check UA and urine culture to rule out infection and other etiologies.    Subjective:  HPI:  Patient here for Covid follow-up.  Was last seen about a week and a half ago for cough after being diagnosed with Covid 2 weeks prior.  We treated him with a course of azithromycin and prednisone.  Symptoms improved however over the last couple of days had has more back pain, cough, and sneeze.  Shortness of breath seems to be improving.  Appetite seems to be improving.  He has lost about 30 pounds since being diagnosed with Covid last month.  No reported chest pain.  Since being diagnosed with Covid he has noticed his been more difficult for him to stop urination.  No reported burning.  No reported hematuria.       Objective:  Physical Exam: BP 114/78   Pulse 65   Temp 98 F (36.7 C) (Temporal)   Ht 5\' 10"  (1.778 m)   Wt 202 lb (91.6 kg)   SpO2 97%   BMI 28.98 kg/m   Gen: No acute distress, resting comfortably CV: Regular rate and rhythm with no murmurs appreciated Pulm: Normal work of breathing, clear to auscultation bilaterally with no crackles, wheezes, or rhonchi Neuro: Grossly normal, moves all extremities Psych: Normal affect and thought content      Leilany Digeronimo M. Jerline Pain, MD 06/08/2020 8:23 AM

## 2020-06-09 LAB — UNLABELED: Test Ordered On Req: 7909

## 2020-06-12 LAB — URINE CULTURE
MICRO NUMBER:: 11021124
Result:: NO GROWTH
SPECIMEN QUALITY:: ADEQUATE

## 2020-06-12 NOTE — Progress Notes (Signed)
Please inform patient of the following:  Urine culture is negative for UTI.

## 2020-06-14 ENCOUNTER — Other Ambulatory Visit: Payer: Medicare Other

## 2020-06-14 ENCOUNTER — Other Ambulatory Visit: Payer: Self-pay

## 2020-06-14 ENCOUNTER — Other Ambulatory Visit: Payer: Self-pay | Admitting: *Deleted

## 2020-06-14 DIAGNOSIS — R300 Dysuria: Secondary | ICD-10-CM

## 2020-06-14 LAB — URINALYSIS, ROUTINE W REFLEX MICROSCOPIC
Bilirubin Urine: NEGATIVE
Glucose, UA: NEGATIVE
Hgb urine dipstick: NEGATIVE
Ketones, ur: NEGATIVE
Leukocytes,Ua: NEGATIVE
Nitrite: NEGATIVE
Protein, ur: NEGATIVE
Specific Gravity, Urine: 1.007 (ref 1.001–1.03)
pH: 6.5 (ref 5.0–8.0)

## 2020-06-14 NOTE — Progress Notes (Signed)
Please inform patient of the following:  UA is normal.  Brantleigh Mifflin M. Jerline Pain, MD 06/14/2020 3:50 PM

## 2020-06-25 ENCOUNTER — Encounter: Payer: Self-pay | Admitting: Family Medicine

## 2020-08-16 DIAGNOSIS — R3912 Poor urinary stream: Secondary | ICD-10-CM | POA: Diagnosis not present

## 2020-08-28 DIAGNOSIS — G4733 Obstructive sleep apnea (adult) (pediatric): Secondary | ICD-10-CM | POA: Diagnosis not present

## 2020-10-08 ENCOUNTER — Ambulatory Visit: Payer: Medicare Other | Admitting: Family Medicine

## 2020-10-08 ENCOUNTER — Other Ambulatory Visit: Payer: Medicare Other

## 2020-10-12 ENCOUNTER — Encounter: Payer: Self-pay | Admitting: Family Medicine

## 2020-10-12 ENCOUNTER — Ambulatory Visit (INDEPENDENT_AMBULATORY_CARE_PROVIDER_SITE_OTHER): Payer: Medicare Other | Admitting: Family Medicine

## 2020-10-12 ENCOUNTER — Other Ambulatory Visit: Payer: Self-pay

## 2020-10-12 VITALS — BP 100/58 | HR 67 | Temp 98.3°F | Ht 70.0 in | Wt 229.0 lb

## 2020-10-12 DIAGNOSIS — J329 Chronic sinusitis, unspecified: Secondary | ICD-10-CM | POA: Diagnosis not present

## 2020-10-12 MED ORDER — DOXYCYCLINE HYCLATE 100 MG PO TABS: 100.0000 mg | ORAL_TABLET | Freq: Two times a day (BID) | ORAL | 0 refills | Status: DC

## 2020-10-12 MED ORDER — AZELASTINE HCL 0.1 % NA SOLN: 2.0000 | Freq: Two times a day (BID) | NASAL | 12 refills | Status: DC

## 2020-10-12 MED ORDER — PREDNISONE 50 MG PO TABS: ORAL_TABLET | ORAL | 0 refills | Status: DC

## 2020-10-12 NOTE — Patient Instructions (Signed)
It was very nice to see you today!  You have a sinus infection.  Please start the prednisone, doxycycline, and Astelin.  Let me know if not improving by next week.  Take care, Dr Jerline Pain  Please try these tips to maintain a healthy lifestyle:   Eat at least 3 REAL meals and 1-2 snacks per day.  Aim for no more than 5 hours between eating.  If you eat breakfast, please do so within one hour of getting up.    Each meal should contain half fruits/vegetables, one quarter protein, and one quarter carbs (no bigger than a computer mouse)   Cut down on sweet beverages. This includes juice, soda, and sweet tea.     Drink at least 1 glass of water with each meal and aim for at least 8 glasses per day   Exercise at least 150 minutes every week.

## 2020-10-12 NOTE — Progress Notes (Signed)
   Joseph Mcdaniel is a 75 y.o. male who presents today for an office visit.  Assessment/Plan:  New/Acute Problems: Sinusitis Symptoms started 5 months ago likely sequela to COVID-19.  We will start doxycycline, Astelin, and prednisone burst.  Encourage good oral hydration.  Discussed reasons to return to care.  If not improving may consider CT scan or referral to ENT.    Subjective:  HPI:  Patient here with sinus congestion for the last 5 months.  Was diagnosed with Covid around that time.  Other symptoms have resolved but has had persistent sinus congestion and drainage.  Occasionally has productive cough.  He will sometimes blow thick, yellow mucus out of his nose.  Breathing is okay.  Has been using Flonase with no improvement.  No fevers or chills.  No other treatments tried.       Objective:  Physical Exam: BP (!) 100/58   Pulse 67   Temp 98.3 F (36.8 C) (Temporal)   Ht 5\' 10"  (1.778 m)   Wt 229 lb (103.9 kg)   SpO2 97%   BMI 32.86 kg/m   Gen: No acute distress, resting comfortably HEENT: TMs clear.  OP erythematous. CV: Regular rate and rhythm with no murmurs appreciated Pulm: Normal work of breathing, clear to auscultation bilaterally with no crackles, wheezes, or rhonchi Neuro: Grossly normal, moves all extremities Psych: Normal affect and thought content      Peter Keyworth M. Jerline Pain, MD 10/12/2020 9:05 AM

## 2020-11-09 DIAGNOSIS — R3912 Poor urinary stream: Secondary | ICD-10-CM | POA: Diagnosis not present

## 2020-11-21 ENCOUNTER — Telehealth: Payer: Self-pay | Admitting: Family Medicine

## 2020-11-21 NOTE — Telephone Encounter (Signed)
Left message for patient to call back and schedule Medicare Annual Wellness Visit (AWV) either virtually or in office. No detailed message left  Last AWV 02/21/2019  please schedule at anytime with [facility] Nurse Health Advisor 1 or 2   This should be a 45 minute visit.

## 2020-11-28 DIAGNOSIS — G4733 Obstructive sleep apnea (adult) (pediatric): Secondary | ICD-10-CM | POA: Diagnosis not present

## 2021-01-24 ENCOUNTER — Encounter: Payer: Self-pay | Admitting: Gastroenterology

## 2021-01-24 ENCOUNTER — Ambulatory Visit: Payer: Medicare Other | Admitting: Gastroenterology

## 2021-01-24 VITALS — BP 106/66 | HR 62 | Ht 70.0 in | Wt 228.0 lb

## 2021-01-24 DIAGNOSIS — K219 Gastro-esophageal reflux disease without esophagitis: Secondary | ICD-10-CM

## 2021-01-24 MED ORDER — OMEPRAZOLE 20 MG PO CPDR: 20.0000 mg | DELAYED_RELEASE_CAPSULE | Freq: Every day | ORAL | 3 refills | Status: DC

## 2021-01-24 NOTE — Patient Instructions (Addendum)
If you are age 74 or older, your body mass index should be between 23-30. Your Body mass index is 32.71 kg/m. If this is out of the aforementioned range listed, please consider follow up with your Primary Care Provider.  If you are age 67 or younger, your body mass index should be between 19-25. Your Body mass index is 32.71 kg/m. If this is out of the aformentioned range listed, please consider follow up with your Primary Care Provider.   You have been scheduled for an endoscopy. Please follow written instructions given to you at your visit today. If you use inhalers (even only as needed), please bring them with you on the day of your procedure.   We have sent the following medications to your pharmacy for you to pick up at your convenience: Omeprazole 20 mg: Take daily for 4 to 6 weeks than take daily as needed  Thank you for entrusting me with your care and for choosing [facility] [place], Dr. Bellwood Cellar

## 2021-01-24 NOTE — Progress Notes (Signed)
HPI :  75 y/o male here for a follow up visit for GERD. He has seen me in the past for altered bowel habits and for his colonoscopy exams, has a history of colon polyps.   He is here today for history of reflux.  He states for the past 3 to 4 months this is really been bothering him.  He endorses longstanding reflux symptoms for years for which he has used Tums and Rolaids as needed which typically works.  He has been using these much more recently in recent months for symptoms.  He endorses heartburn and regurgitation that can bother him at any time.  He does have some postprandial symptoms but no particular trigger foods that bother him or that he can identify.  He also has discomfort/soreness in his epigastric area as well with this.  He denies any dysphagia.  No nausea or vomiting.  No odynophagia.  He has taken some of his wife's Protonix, 20 mg, which he takes every 3 to 4 days which she states definitely has helped him when he takes it.  He does endorse a prior history of tobacco abuse for about 20 years, he quit in the 1990s.  He denies any family history of esophageal cancer.  He has never had a prior EGD.  He denies any current cardiopulmonary symptoms.  Has a history of sleep apnea.  Has a history of remote atrial fibrillation but no problems recently with this and he does not take any anticoagulation.  He has a history of colon polyps, his last exam was in 2020 and he is due for another colonoscopy in June 2023.   Colonoscopy 02/23/19 - The perianal and digital rectal examinations were normal. - Two sessile polyps were found in the cecum. The polyps were diminutive in size. These polyps were removed with a cold biopsy forceps. Resection and retrieval were complete. - Two sessile polyps were found in the transverse colon. The polyps were 3 mm in size. These polyps were removed with a cold snare. Resection and retrieval were complete. - A 3 mm polyp was found in the descending colon. The  polyp was sessile. The polyp was removed with a cold snare. Resection and retrieval were complete. - A 3 mm polyp was found in the sigmoid colon. The polyp was sessile. The polyp was removed with a cold snare. Resection and retrieval were complete. - There was a small lipoma, in the sigmoid colon. - Internal hemorrhoids were found. - The prep was fair on insertion. Several minutes spent lavaging the colon to achieve adequate views. The exam was otherwise without abnormality.  Surgical [P], cecum (cold forcep), transverse, descending and sigmoid, polyp (5) - TUBULAR ADENOMA(S). - INFLAMMATORY POLYP. - NO HIGH GRADE DYSPLASIA OR MALIGNANCY.  Recommended repeat in 3 years  Colonoscopy 01/04/16 - The perianal and digital rectal examinations were normal. - A 4 mm polyp was found in the ascending colon. The polyp was sessile. The polyp was removed with a cold snare. Resection and retrieval were complete. - A 5 mm polyp was found in the transverse colon. The polyp was sessile. The polyp was removed with a cold snare. Resection and retrieval were complete. - A 4 mm polyp was found in the sigmoid colon. The polyp was sessile. The polyp was removed with a cold snare. Resection and retrieval were complete. - The terminal ileum appeared normal. - Non-bleeding internal hemorrhoids were found during retroflexion. The hemorrhoids were small. - The exam was otherwise without  abnormality. No abnormality noted which could cause the patient's changes in bowel symptoms, which perhaps were due to change in medications (narcotics)   Colonoscopy 11/05/2011 - lipoma, hyperplastic polyps Colonoscopy 09/2007 - 1cm polyp - tubulovillous adenoma  Nuclear stress 08/21/17 - normal exam, EF 47%     Past Medical History:  Diagnosis Date  . A-fib [facility]) with spontaneous convert to NSR 08/2012  . Adenomatous colon polyp 09/2007  . Anxiety   . Cardiac CT on 5/11, calcium score 0   . Cataracts, bilateral   .  DDD (degenerative disc disease)   . ED (erectile dysfunction)   . Fibromyalgia   . Generalized OA   . GERD (gastroesophageal reflux disease)   . Myalgia and myositis   . OSA (obstructive sleep apnea), compliant with CPAP   . Sleep apnea    cpap  . Vitamin D deficiency      Past Surgical History:  Procedure Laterality Date  . CATARACT EXTRACTION Right 02/2019  . CHONDROPLASTY Left   . COLONOSCOPY    . LIPOMA EXCISION    . LUMBAR LAMINECTOMY    . POLYPECTOMY    . ROTATOR CUFF REPAIR Right    Family History  Problem Relation Age of Onset  . Breast cancer Mother   . Diabetes Maternal Aunt   . Heart attack Father 63  . Colon cancer Neg Hx   . Colon polyps Neg Hx   . Rectal cancer Neg Hx   . Stomach cancer Neg Hx   . Esophageal cancer Neg Hx    Social History   Tobacco Use  . Smoking status: Former Smoker    Packs/day: 1.50    Years: 30.00    Pack years: 45.00    Types: Cigarettes    Quit date: 09/08/1994    Years since quitting: 26.3  . Smokeless tobacco: Never Used  . Tobacco comment: Quit smoking 1996, smoked for 30 years, pt smoke 3 packs per day for 1-2 years.   Vaping Use  . Vaping Use: Never used  Substance Use Topics  . Alcohol use: Yes    Alcohol/week: 3.0 standard drinks    Types: 3 Cans of beer per week    Comment: occ beer on weekends  . Drug use: No   Current Outpatient Medications  Medication Sig Dispense Refill  . aspirin 81 MG chewable tablet Chew 81 mg by mouth daily.     Marland Kitchen azelastine (ASTELIN) 0.1 % nasal spray Place 2 sprays into both nostrils 2 (two) times daily. 30 mL 12  . Ca Carbonate-Mag Hydroxide (ROLAIDS PO) Take by mouth as needed.    . Cholecalciferol (VITAMIN D) 125 MCG (5000 UT) CAPS Take 1 capsule by mouth daily.    . Coenzyme Q10 (COQ10) 100 MG CAPS Take by mouth.    . mineral oil liquid Take 15 mLs by mouth at bedtime as needed for moderate constipation.    . Multiple Vitamin (MULTIVITAMIN WITH MINERALS) TABS tablet Take 1 tablet  by mouth daily.    . pantoprazole (PROTONIX) 20 MG tablet Take 20 mg by mouth as needed. Take 1 tablet every 3 days    . tadalafil (CIALIS) 5 MG tablet Take 1 tablet (5 mg total) by mouth daily as needed. Erectile dysfunction 10 tablet 0  . Zinc 30 MG CAPS Take by mouth.     No current facility-administered medications for this visit.   Allergies  Allergen Reactions  . Codeine Itching  . Hydrocodone Itching  . Omeprazole  Reaction unknown  . Penicillins Hives and Itching    Has patient had a PCN reaction causing immediate rash, facial/tongue/throat swelling, SOB or lightheadedness with hypotension: Yes Has patient had a PCN reaction causing severe rash involving mucus membranes or skin necrosis: No Has patient had a PCN reaction that required hospitalization: No Has patient had a PCN reaction occurring within the last 10 years: No If all of the above answers are "NO", then may proceed with Cephalosporin use.      Review of Systems: All systems reviewed and negative except where noted in HPI.   Lab Results  Component Value Date   WBC 8.5 02/21/2019   HGB 14.6 02/21/2019   HCT 43.7 02/21/2019   MCV 90.8 02/21/2019   PLT 175.0 02/21/2019    Lab Results  Component Value Date   CREATININE 0.95 02/21/2019   BUN 27 (H) 02/21/2019   NA 141 02/21/2019   K 3.5 02/21/2019   CL 105 02/21/2019   CO2 28 02/21/2019    Lab Results  Component Value Date   ALT 18 02/21/2019   AST 16 02/21/2019   ALKPHOS 63 02/21/2019   BILITOT 0.5 02/21/2019     Physical Exam: BP 106/66 (BP Location: Left Arm, Patient Position: Sitting, Cuff Size: Normal)   Pulse 62   Ht 5\' 10"  (1.778 m)   Wt 228 lb (103.4 kg)   SpO2 97%   BMI 32.71 kg/m  Constitutional: Pleasant,well-developed, male in no acute distress. Neurological: Alert and oriented to person place and time. Psychiatric: Normal mood and affect. Behavior is normal.   ASSESSMENT AND PLAN: 75 year old male here for reassessment  following:  GERD  Longstanding history of intermittent mild reflux symptoms managed with Tums/Rolaids over the years.  Over the past 3 to 4 months has had increased frequency of symptoms as outlined above.  No alarm symptoms.  He has been using his wife's Protonix as needed every few days and that has helped, but his symptoms persist.  Discussed reflux symptoms with him and options for therapy.  Given his frequent symptoms I do think he would benefit from a course of PPI.  We discussed PPIs, long-term risks.  Recommend omeprazole 20 mg daily for 4 to 6 weeks to get control of symptoms and then can use as needed moving forward.  Otherwise we discussed screening for Barrett's esophagus.  Given his age, duration of symptoms, ethnicity, weight, history of tobacco use, he meets criteria for screening endoscopy.  I discussed EGD with him, risk benefits of the exam and that of anesthesia and he wants to proceed.  Further recommendations pending results  Plan: - omeprazole 20mg  / day for 4-6 weeks, gain control of symptoms, and then use PRN. If this does not work for him he should contact me and we can increase dose - EGD for Barrett's esophagus screening, further recommendations pending that result - surveillance colonoscopy due 02/2022 for history of colon polyps  Mathiston Cellar, MD [facility]

## 2021-01-28 ENCOUNTER — Encounter: Payer: Self-pay | Admitting: Emergency Medicine

## 2021-01-28 ENCOUNTER — Ambulatory Visit
Admission: EM | Admit: 2021-01-28 | Discharge: 2021-01-28 | Disposition: A | Discharge status: Home / Self Care | Payer: Medicare Other | Attending: Family Medicine | Admitting: Family Medicine

## 2021-01-28 ENCOUNTER — Other Ambulatory Visit: Payer: Self-pay

## 2021-01-28 DIAGNOSIS — L03312 Cellulitis of back [any part except buttock]: Secondary | ICD-10-CM | POA: Diagnosis not present

## 2021-01-28 DIAGNOSIS — W57XXXA Bitten or stung by nonvenomous insect and other nonvenomous arthropods, initial encounter: Secondary | ICD-10-CM

## 2021-01-28 MED ORDER — TRIAMCINOLONE ACETONIDE 0.1 % EX CREA: 1.0000 "application " | TOPICAL_CREAM | Freq: Two times a day (BID) | CUTANEOUS | 0 refills | Status: DC

## 2021-01-28 MED ORDER — DOXYCYCLINE HYCLATE 100 MG PO CAPS: 100.0000 mg | ORAL_CAPSULE | Freq: Two times a day (BID) | ORAL | 0 refills | Status: DC

## 2021-01-28 NOTE — ED Provider Notes (Signed)
[facility]    CSN: [id number] Arrival date & time: 01/28/21  1457      History   Chief Complaint Chief Complaint  Patient presents with  . Tick Removal    HPI Joseph Mcdaniel is a 75 y.o. male.   Reports that he noticed a tick bite to the middle of his back earlier today.  States that he pulled the tick out, reports that he thinks that he did not get it all out.  Thinks that the head is still in his back.  Reports that the area is red, swollen, warm, very itchy.  Denies OTC treatment at home for this.  Denies previous symptoms.  Denies other rash, fatigue, shortness of breath, nausea, vomiting, diarrhea, fever, other symptoms.  ROS per HPI  The history is provided by the patient.    Past Medical History:  Diagnosis Date  . A-fib [facility]) with spontaneous convert to NSR 08/2012  . Adenomatous colon polyp 09/2007  . Anxiety   . Cardiac CT on 5/11, calcium score 0   . Cataracts, bilateral   . DDD (degenerative disc disease)   . ED (erectile dysfunction)   . Fibromyalgia   . Generalized OA   . GERD (gastroesophageal reflux disease)   . Myalgia and myositis   . OSA (obstructive sleep apnea), compliant with CPAP   . Sleep apnea    cpap  . Vitamin D deficiency     Patient Active Problem List   Diagnosis Date Noted  . Erectile dysfunction 07/11/2019  . Dilated cardiomyopathy ([place]) 04/27/2018  . Aftercare 04/26/2018  . Osteoarthritis of both knees 03/03/2018  . Essential hypertension 01/05/2018  . PAF (paroxysmal atrial fibrillation) ([place]) 02/13/2016  . Cannabis dependence, episodic use ([place]) 08/31/2012  . H/O adenomatous polyp of colon 10/29/2011  . OSA on CPAP 04/03/2008    Past Surgical History:  Procedure Laterality Date  . CATARACT EXTRACTION Right 02/2019  . CHONDROPLASTY Left   . COLONOSCOPY    . LIPOMA EXCISION    . LUMBAR LAMINECTOMY    . POLYPECTOMY    . ROTATOR CUFF REPAIR Right        Home Medications    Prior to Admission  medications   Medication Sig Start Date End Date Taking? Authorizing Provider  doxycycline (VIBRAMYCIN) 100 MG capsule Take 1 capsule (100 mg total) by mouth 2 (two) times daily. 01/28/21  Yes Faustino Congress, NP  triamcinolone cream (KENALOG) 0.1 % Apply 1 application topically 2 (two) times daily. 01/28/21  Yes Faustino Congress, NP  aspirin 81 MG chewable tablet Chew 81 mg by mouth daily.     [provider]  azelastine (ASTELIN) 0.1 % nasal spray Place 2 sprays into both nostrils 2 (two) times daily. 10/12/20   Vivi Barrack, MD  Ca Carbonate-Mag Hydroxide (ROLAIDS PO) Take by mouth as needed.    [provider]  Cholecalciferol (VITAMIN D) 125 MCG (5000 UT) CAPS Take 1 capsule by mouth daily.    [provider]  Coenzyme Q10 (COQ10) 100 MG CAPS Take by mouth.    [provider]  mineral oil liquid Take 15 mLs by mouth at bedtime as needed for moderate constipation.    [provider]  Multiple Vitamin (MULTIVITAMIN WITH MINERALS) TABS tablet Take 1 tablet by mouth daily.    [provider]  omeprazole (PRILOSEC) 20 MG capsule Take 1 capsule (20 mg total) by mouth daily. 01/24/21   Armbruster, Carlota Raspberry, MD  pantoprazole ([place])  20 MG tablet Take 20 mg by mouth as needed. Take 1 tablet every 3 days    [provider]  tadalafil (CIALIS) 5 MG tablet Take 1 tablet (5 mg total) by mouth daily as needed. Erectile dysfunction 12/22/18   Inda Coke, PA  Zinc 30 MG CAPS Take by mouth.    [provider]    Family History Family History  Problem Relation Age of Onset  . Breast cancer Mother   . Diabetes Maternal Aunt   . Heart attack Father 64  . Colon cancer Neg Hx   . Colon polyps Neg Hx   . Rectal cancer Neg Hx   . Stomach cancer Neg Hx   . Esophageal cancer Neg Hx     Social History Social History   Tobacco Use  . Smoking status: Former Smoker    Packs/day: 1.50    Years: 30.00    Pack years: 45.00     Types: Cigarettes    Quit date: 09/08/1994    Years since quitting: 26.4  . Smokeless tobacco: Never Used  . Tobacco comment: Quit smoking 1996, smoked for 30 years, pt smoke 3 packs per day for 1-2 years.   Vaping Use  . Vaping Use: Never used  Substance Use Topics  . Alcohol use: Yes    Alcohol/week: 3.0 standard drinks    Types: 3 Cans of beer per week    Comment: occ beer on weekends  . Drug use: No     Allergies   Codeine, Hydrocodone, Omeprazole, and Penicillins   Review of Systems Review of Systems   Physical Exam Triage Vital Signs ED Triage Vitals  Enc Vitals Group     BP 01/28/21 1620 122/77     Pulse Rate 01/28/21 1620 (!) 52     Resp 01/28/21 1620 17     Temp 01/28/21 1620 97.9 F (36.6 C)     Temp Source 01/28/21 1620 Oral     SpO2 01/28/21 1620 98 %     Weight --      Height --      Head Circumference --      Peak Flow --      Pain Score 01/28/21 1626 2     Pain Loc --      Pain Edu? --      Excl. in [place]? --    No data found.  Updated Vital Signs BP 122/77 (BP Location: Right Arm)   Pulse (!) 52   Temp 97.9 F (36.6 C) (Oral)   Resp 17   SpO2 98%   Visual Acuity Right Eye Distance:   Left Eye Distance:   Bilateral Distance:    Right Eye Near:   Left Eye Near:    Bilateral Near:     Physical Exam Vitals and nursing note reviewed.  Constitutional:      General: He is not in acute distress.    Appearance: Normal appearance. He is well-developed and normal weight. He is not ill-appearing.  HENT:     Head: Normocephalic and atraumatic.     Nose: Nose normal.     Mouth/Throat:     Mouth: Mucous membranes are moist.     Pharynx: Oropharynx is clear.  Eyes:     Extraocular Movements: Extraocular movements intact.     Conjunctiva/sclera: Conjunctivae normal.     Pupils: Pupils are equal, round, and reactive to light.  Cardiovascular:     Rate and Rhythm: Normal rate and regular rhythm.  Pulmonary:     Effort: Pulmonary effort is  normal.  Musculoskeletal:        General: Normal range of motion.     Cervical back: Normal range of motion and neck supple.  Skin:    General: Skin is warm and dry.     Capillary Refill: Capillary refill takes less than 2 seconds.     Findings: Lesion present.       Neurological:     General: No focal deficit present.     Mental Status: He is alert and oriented to person, place, and time.  Psychiatric:        Mood and Affect: Mood normal.        Behavior: Behavior normal.        Thought Content: Thought content normal.      UC Treatments / Results  Labs (all labs ordered are listed, but only abnormal results are displayed) Labs Reviewed - No data to display  EKG   Radiology No results found.  Procedures Foreign Body Removal  Date/Time: 01/28/2021 4:56 PM Performed by: Faustino Congress, NP Authorized by: Faustino Congress, NP   Consent:    Consent obtained:  Verbal   Consent given by:  Patient   Risks discussed:  Bleeding, infection and incomplete removal Location:    Location:  Trunk   Trunk location:  Upper back   Depth:  Intradermal   Tendon involvement:  None Pre-procedure details:    Imaging:  None Anesthesia:    Anesthesia method:  None Procedure type:    Procedure complexity:  Simple Procedure details:    Localization method:  Finder needle and probed   Dissection of underlying tissues: no     Bloodless field: yes     Removal mechanism:  Forceps   Foreign bodies recovered:  1   Description:  Very small, black foreign body noted, consistent with the head of a tick   Intact foreign body removal: yes   Post-procedure details:    Confirmation:  No additional foreign bodies on visualization   Skin closure:  None   Dressing:  Adhesive bandage   Procedure completion:  Tolerated well, no immediate complications   (including critical care time)  Medications Ordered in UC Medications - No data to display  Initial Impression / Assessment and  Plan / UC Course  I have reviewed the triage vital signs and the nursing notes.  Pertinent labs & imaging results that were available during my care of the patient were reviewed by me and considered in my medical decision making (see chart for details).    Tick bite Cellulitis of the back  Foreign body removed in office as above Doxycycline 100 mg twice daily x7 days prescribed Triamcinolone cream prescribed for itching as needed twice daily Follow up with this office or with primary care if symptoms are persisting.  Follow up in the ER for high fever, trouble swallowing, trouble breathing, other concerning symptoms.   Final Clinical Impressions(s) / UC Diagnoses   Final diagnoses:  Cellulitis of back except buttock  Tick bite, unspecified site, initial encounter     Discharge Instructions     I have removed the rest of the tick from your back  I have sent in doxycycline for you to take one tablet twice a day for 10 days  I have sent in triamcinolone cream for you to use to the area twice a day as needed.  Follow up with this office or with primary care if  symptoms are persisting.  Follow up in the ER for high fever, trouble swallowing, trouble breathing, other concerning symptoms.     ED Prescriptions    Medication Sig Dispense Auth. Provider   doxycycline (VIBRAMYCIN) 100 MG capsule Take 1 capsule (100 mg total) by mouth 2 (two) times daily. 14 capsule Faustino Congress, NP   triamcinolone cream (KENALOG) 0.1 % Apply 1 application topically 2 (two) times daily. 30 g Faustino Congress, NP     PDMP not reviewed this encounter.   Faustino Congress, NP 02/01/21 1018

## 2021-01-28 NOTE — ED Triage Notes (Signed)
Pt noticed tick on his back today and tried to remove and thinks there head is still in there. Red and swollen

## 2021-01-28 NOTE — Discharge Instructions (Signed)
I have removed the rest of the tick from your back  I have sent in doxycycline for you to take one tablet twice a day for 10 days  I have sent in triamcinolone cream for you to use to the area twice a day as needed.  Follow up with this office or with primary care if symptoms are persisting.  Follow up in the ER for high fever, trouble swallowing, trouble breathing, other concerning symptoms.

## 2021-02-15 ENCOUNTER — Encounter: Payer: Medicare Other | Admitting: Gastroenterology

## 2021-02-27 ENCOUNTER — Other Ambulatory Visit: Payer: Self-pay

## 2021-02-27 ENCOUNTER — Ambulatory Visit (AMBULATORY_SURGERY_CENTER): Payer: Medicare Other | Admitting: Gastroenterology

## 2021-02-27 ENCOUNTER — Encounter: Payer: Self-pay | Admitting: Gastroenterology

## 2021-02-27 VITALS — BP 132/75 | HR 61 | Temp 97.6°F | Resp 8 | Ht 70.0 in | Wt 228.0 lb

## 2021-02-27 DIAGNOSIS — K219 Gastro-esophageal reflux disease without esophagitis: Secondary | ICD-10-CM | POA: Diagnosis not present

## 2021-02-27 DIAGNOSIS — G4733 Obstructive sleep apnea (adult) (pediatric): Secondary | ICD-10-CM | POA: Diagnosis not present

## 2021-02-27 MED ORDER — SODIUM CHLORIDE 0.9 % IV SOLN: 500.0000 mL | Freq: Once | INTRAVENOUS | Status: DC

## 2021-02-27 NOTE — Progress Notes (Signed)
pt tolerated well. VSS. awake and to recovery. Report given to RN. Bite block left insitu to recovery. No trauma. 

## 2021-02-27 NOTE — Progress Notes (Signed)
VS-CW 

## 2021-02-27 NOTE — Op Note (Signed)
[place] Patient Name: Charle Clear Procedure Date: 02/27/2021 9:26 AM MRN: [id number] Endoscopist: Remo Lipps P. Havery [name] , MD Age: 75 Referring MD:  Date of Birth: 1946/03/18 Gender: Male Account #: [id number] Procedure:                Upper GI endoscopy Indications:              Screening for Barrett's esophagus - history of                            GERD, now on protonix with good control of symptoms Medicines:                Monitored Anesthesia Care Procedure:                Pre-Anesthesia Assessment:                           - Prior to the procedure, a History and Physical                            was performed, and patient medications and                            allergies were reviewed. The patient's tolerance of                            previous anesthesia was also reviewed. The risks                            and benefits of the procedure and the sedation                            options and risks were discussed with the patient.                            All questions were answered, and informed consent                            was obtained. Prior Anticoagulants: The patient has                            taken no previous anticoagulant or antiplatelet                            agents. ASA Grade Assessment: III - A patient with                            severe systemic disease. After reviewing the risks                            and benefits, the patient was deemed in                            satisfactory condition to undergo the procedure.  After obtaining informed consent, the endoscope was                            passed under direct vision. Throughout the                            procedure, the patient's blood pressure, pulse, and                            oxygen saturations were monitored continuously. The                            Endoscope was introduced through the mouth, and                             advanced to the second part of duodenum. The upper                            GI endoscopy was accomplished without difficulty.                            The patient tolerated the procedure well. Scope In: Scope Out: Findings:                 Esophagogastric landmarks were identified: the                            Z-line was found at 40 cm, the gastroesophageal                            junction was found at 40 cm and the upper extent of                            the gastric folds was found at 40 cm from the                            incisors.                           Very mild focal / healing esophagitis was found 39                            cm from the incisors.                           The exam of the esophagus was otherwise normal. No                            Barrett's esophagus                           The entire examined stomach was normal.                           The duodenal bulb and  second portion of the                            duodenum were normal. Complications:            No immediate complications. Estimated blood loss:                            None. Estimated Blood Loss:     Estimated blood loss: none. Impression:               - Esophagogastric landmarks identified.                           - Mild focal reflux esophagitis - seems to be                            healing on PPI                           - Normal esophagus otherwise - no evidence of                            Barrett's esophagus                           - Normal stomach.                           - Normal duodenal bulb and second portion of the                            duodenum. Recommendation:           - Patient has a contact number available for                            emergencies. The signs and symptoms of potential                            delayed complications were discussed with the                            patient. Return to normal activities tomorrow.                             Written discharge instructions were provided to the                            patient.                           - Resume previous diet.                           - Continue present medications.                           - Continue  protonix daily or as needed for reflux                            moving forward [employer]. Havery [name], MD 02/27/2021 9:59:22 AM This report has been signed electronically.

## 2021-02-27 NOTE — Patient Instructions (Signed)
Read all of the handouts given to you by your recovery room nurse.  Be sure to take your protonix every day on an empty stomach.  YOU HAD AN ENDOSCOPIC PROCEDURE TODAY AT [facility]:   Refer to the procedure report that was given to you for any specific questions about what was found during the examination.  If the procedure report does not answer your questions, please call your gastroenterologist to clarify.  If you requested that your care partner not be given the details of your procedure findings, then the procedure report has been included in a sealed envelope for you to review at your convenience later.  YOU SHOULD EXPECT: Some feelings of bloating in the abdomen. Passage of more gas than usual.  Walking can help get rid of the air that was put into your GI tract during the procedure and reduce the bloating.   Please Note:  You might notice some irritation and congestion in your nose or some drainage.  This is from the oxygen used during your procedure.  There is no need for concern and it should clear up in a day or so.  SYMPTOMS TO REPORT IMMEDIATELY:   Following upper endoscopy (EGD)  Vomiting of blood or coffee ground material  New chest pain or pain under the shoulder blades  Painful or persistently difficult swallowing  New shortness of breath  Fever of 100F or higher  Black, tarry-looking stools  For urgent or emergent issues, a gastroenterologist can be reached at any hour by calling [phone]. Do not use MyChart messaging for urgent concerns.    DIET:  We do recommend a small meal at first, but then you may proceed to your regular diet.  Drink plenty of fluids but you should avoid alcoholic beverages for 24 hours.  ACTIVITY:  You should plan to take it easy for the rest of today and you should NOT DRIVE or use heavy machinery until tomorrow (because of the sedation medicines used during the test).    FOLLOW UP: Our staff will call the number listed  on your records 48-72 hours following your procedure to check on you and address any questions or concerns that you may have regarding the information given to you following your procedure. If we do not reach you, we will leave a message.  We will attempt to reach you two times.  During this call, we will ask if you have developed any symptoms of COVID 19. If you develop any symptoms (ie: fever, flu-like symptoms, shortness of breath, cough etc.) before then, please call [phone].  If you test positive for Covid 19 in the 2 weeks post procedure, please call and report this information to [place].     SIGNATURES/CONFIDENTIALITY: You and/or your care partner have signed paperwork which will be entered into your electronic medical record.  These signatures attest to the fact that that the information above on your After Visit Summary has been reviewed and is understood.  Full responsibility of the confidentiality of this discharge information lies with you and/or your care-partner.

## 2021-03-01 ENCOUNTER — Telehealth: Payer: Self-pay

## 2021-03-01 NOTE — Telephone Encounter (Signed)
  Follow up Call-  Call back number 02/27/2021 02/23/2019  Post procedure Call Back phone  # [phone]  Permission to leave phone message Yes Yes  Some recent data might be hidden     Patient questions:  Do you have a fever, pain , or abdominal swelling? No. Pain Score  0 *  Have you tolerated food without any problems? No.  Have you been able to return to your normal activities? No.  Do you have any questions about your discharge instructions: Diet   No. Medications  No. Follow up visit  No.  Do you have questions or concerns about your Care? No.  Actions: * If pain score is 4 or above: No action needed, pain <4.

## 2021-04-12 ENCOUNTER — Telehealth: Payer: Self-pay

## 2021-04-12 NOTE — Chronic Care Management (AMB) (Signed)
  Chronic Care Management   Note  04/12/2021 Name: NILS THOR MRN: [id number] DOB: 01-25-1946  Maxie Barb is a 75 y.o. year old male who is a primary care patient of Vivi Barrack, MD. I reached out to Maxie Barb by phone today in response to a referral sent by Mr. Sherryle Lis PCP, Vivi Barrack, MD      Mr. Wishart was given information about Chronic Care Management services today including:  CCM service includes personalized support from designated clinical staff supervised by his physician, including individualized plan of care and coordination with other care providers 24/7 contact phone numbers for assistance for urgent and routine care needs. Service will only be billed when office clinical staff spend 20 minutes or more in a month to coordinate care. Only one practitioner may furnish and bill the service in a calendar month. The patient may stop CCM services at any time (effective at the end of the month) by phone call to the office staff. The patient will be responsible for cost sharing (co-pay) of up to 20% of the service fee (after annual deductible is met).  Patient did not agree to enrollment in care management services and does not wish to consider at this time.  Follow up plan: Patient declines engagement by the care management team. Appropriate care team members and provider have been notified via electronic communication.   Noreene Larsson, [address], [place], [place] [address] Direct Dial: [phone] [email or web address]

## 2021-05-01 ENCOUNTER — Ambulatory Visit (INDEPENDENT_AMBULATORY_CARE_PROVIDER_SITE_OTHER): Payer: Medicare Other | Admitting: Physician Assistant

## 2021-05-01 ENCOUNTER — Encounter: Payer: Self-pay | Admitting: Physician Assistant

## 2021-05-01 ENCOUNTER — Other Ambulatory Visit: Payer: Self-pay

## 2021-05-01 VITALS — BP 118/72 | HR 69 | Temp 100.1°F | Wt 225.0 lb

## 2021-05-01 DIAGNOSIS — R319 Hematuria, unspecified: Secondary | ICD-10-CM | POA: Diagnosis not present

## 2021-05-01 DIAGNOSIS — R0981 Nasal congestion: Secondary | ICD-10-CM

## 2021-05-01 DIAGNOSIS — R311 Benign essential microscopic hematuria: Secondary | ICD-10-CM | POA: Diagnosis not present

## 2021-05-01 DIAGNOSIS — M545 Low back pain, unspecified: Secondary | ICD-10-CM | POA: Diagnosis not present

## 2021-05-01 LAB — POCT URINALYSIS DIPSTICK
Bilirubin, UA: NEGATIVE
Blood, UA: POSITIVE
Glucose, UA: NEGATIVE
Ketones, UA: NEGATIVE
Leukocytes, UA: NEGATIVE
Nitrite, UA: NEGATIVE
Protein, UA: NEGATIVE
Spec Grav, UA: 1.02
Urobilinogen, UA: 0.2 U/dL
pH, UA: 6

## 2021-05-01 MED ORDER — DOXYCYCLINE HYCLATE 100 MG PO TABS: 100.0000 mg | ORAL_TABLET | Freq: Two times a day (BID) | ORAL | 0 refills | Status: DC

## 2021-05-01 MED ORDER — AZELASTINE HCL 0.1 % NA SOLN: 2.0000 | Freq: Two times a day (BID) | NASAL | 12 refills | Status: DC

## 2021-05-01 NOTE — Progress Notes (Signed)
Joseph Mcdaniel is a 75 y.o. male here for a new problem.  History of Present Illness:   Chief Complaint  Patient presents with   sinus congestion    Started two weeks ago, negative COVID home test Eye and sinus pressure    HPI  Sinus congestion Reports two week history of sinus congestion, nasal drainage, mild cough, facial pressure. Denies: fever, SOB, chest pain, nausea, vomiting. Has not tried anything OTC. Did home COVID test yesterday and was negative.  Back pain Reports a few day hx of left low back pain. Has history of "kidney infections" and pain feels similar to this. Denies fever, SOB, chest pain, nausea, vomiting, bowel/bladder incontinence. Denies prior known hx of kidney stones. Pain is not severe, just a dull achy pain. States that he is constipated too.  Past Medical History:  Diagnosis Date   A-fib [facility]) with spontaneous convert to NSR 08/2012   Adenomatous colon polyp 09/2007   Anxiety    Cardiac CT on 5/11, calcium score 0    Cataracts, bilateral    DDD (degenerative disc disease)    ED (erectile dysfunction)    Fibromyalgia    Generalized OA    GERD (gastroesophageal reflux disease)    Myalgia and myositis    OSA (obstructive sleep apnea), compliant with CPAP    Sleep apnea    cpap   Vitamin D deficiency      Social History   Tobacco Use   Smoking status: Former    Packs/day: 1.50    Years: 30.00    Pack years: 45.00    Types: Cigarettes    Quit date: 09/08/1994    Years since quitting: 26.6   Smokeless tobacco: Never   Tobacco comments:    Quit smoking 1996, smoked for 30 years, pt smoke 3 packs per day for 1-2 years.   Vaping Use   Vaping Use: Never used  Substance Use Topics   Alcohol use: Yes    Alcohol/week: 3.0 standard drinks    Types: 3 Cans of beer per week    Comment: occ beer on weekends   Drug use: No    Past Surgical History:  Procedure Laterality Date   CATARACT EXTRACTION Right 02/2019   CHONDROPLASTY Left     COLONOSCOPY     LIPOMA EXCISION     LUMBAR LAMINECTOMY     POLYPECTOMY     ROTATOR CUFF REPAIR Right    UPPER GASTROINTESTINAL ENDOSCOPY      Family History  Problem Relation Age of Onset   Breast cancer Mother    Diabetes Maternal Aunt    Heart attack Father 22   Colon cancer Neg Hx    Colon polyps Neg Hx    Rectal cancer Neg Hx    Stomach cancer Neg Hx    Esophageal cancer Neg Hx     Allergies  Allergen Reactions   Codeine Itching   Hydrocodone Itching   Omeprazole     Reaction unknown   Penicillins Hives and Itching    Has patient had a PCN reaction causing immediate rash, facial/tongue/throat swelling, SOB or lightheadedness with hypotension: Yes Has patient had a PCN reaction causing severe rash involving mucus membranes or skin necrosis: No Has patient had a PCN reaction that required hospitalization: No Has patient had a PCN reaction occurring within the last 10 years: No If all of the above answers are "NO", then may proceed with Cephalosporin use.     Current Medications:  Current Outpatient Medications:    aspirin 81 MG chewable tablet, Chew 81 mg by mouth daily. , Disp: , Rfl:    Ca Carbonate-Mag Hydroxide (ROLAIDS PO), Take by mouth as needed., Disp: , Rfl:    Cholecalciferol (VITAMIN D) 125 MCG (5000 UT) CAPS, Take 1 capsule by mouth daily., Disp: , Rfl:    Coenzyme Q10 (COQ10) 100 MG CAPS, Take by mouth., Disp: , Rfl:    doxycycline (VIBRA-TABS) 100 MG tablet, Take 1 tablet (100 mg total) by mouth 2 (two) times daily., Disp: 20 tablet, Rfl: 0   mineral oil liquid, Take 15 mLs by mouth at bedtime as needed for moderate constipation., Disp: , Rfl:    Multiple Vitamin (MULTIVITAMIN WITH MINERALS) TABS tablet, Take 1 tablet by mouth daily., Disp: , Rfl:    omeprazole (PRILOSEC) 20 MG capsule, Take 1 capsule (20 mg total) by mouth daily., Disp: 90 capsule, Rfl: 3   pantoprazole (PROTONIX) 20 MG tablet, Take 20 mg by mouth as needed. Take 1 tablet every 3 days,  Disp: , Rfl:    tadalafil (CIALIS) 5 MG tablet, Take 1 tablet (5 mg total) by mouth daily as needed. Erectile dysfunction, Disp: 10 tablet, Rfl: 0   triamcinolone cream (KENALOG) 0.1 %, Apply 1 application topically 2 (two) times daily., Disp: 30 g, Rfl: 0   Zinc 30 MG CAPS, Take by mouth., Disp: , Rfl:    azelastine (ASTELIN) 0.1 % nasal spray, Place 2 sprays into both nostrils 2 (two) times daily., Disp: 30 mL, Rfl: 12   Review of Systems:   ROS Negative unless otherwise specified per HPI.  Vitals:   Vitals:   05/01/21 1520  BP: 118/72  Pulse: 69  Temp: 100.1 F (37.8 C)  TempSrc: Temporal  SpO2: 96%  Weight: 225 lb (102.1 kg)     Body mass index is 32.28 kg/m.  Physical Exam:   Physical Exam Vitals and nursing note reviewed.  Constitutional:      General: He is not in acute distress.    Appearance: He is well-developed. He is not ill-appearing or toxic-appearing.  HENT:     Head: Normocephalic and atraumatic.     Right Ear: Tympanic membrane, ear canal and external ear normal. Tympanic membrane is not erythematous, retracted or bulging.     Left Ear: Tympanic membrane, ear canal and external ear normal. Tympanic membrane is not erythematous, retracted or bulging.     Nose: Nose normal.     Right Sinus: No maxillary sinus tenderness or frontal sinus tenderness.     Left Sinus: No maxillary sinus tenderness or frontal sinus tenderness.     Mouth/Throat:     Pharynx: Uvula midline. No oropharyngeal exudate or posterior oropharyngeal erythema.  Eyes:     General: Lids are normal.     Conjunctiva/sclera: Conjunctivae normal.  Neck:     Trachea: Trachea normal.  Cardiovascular:     Rate and Rhythm: Normal rate and regular rhythm.     Pulses: Normal pulses.     Heart sounds: Normal heart sounds, S1 normal and S2 normal.     Comments: No LE edema Pulmonary:     Effort: Pulmonary effort is normal. No accessory muscle usage or respiratory distress.     Breath sounds:  Normal breath sounds. No decreased breath sounds, wheezing, rhonchi or rales.  Musculoskeletal:     Comments: No decreased ROM 2/2 pain with flexion/extension, lateral side bends, or rotation.  No CVA tenderness   Lymphadenopathy:  Cervical: No cervical adenopathy.  Skin:    General: Skin is warm and dry.  Neurological:     Mental Status: He is alert.     GCS: GCS eye subscore is 4. GCS verbal subscore is 5. GCS motor subscore is 6.  Psychiatric:        Speech: Speech normal.        Behavior: Behavior normal. Behavior is cooperative.    Results for orders placed or performed in visit on 05/01/21  POCT Urinalysis Dipstick  Result Value Ref Range   Color, UA dark yellow    Clarity, UA clear    Glucose, UA Negative Negative   Bilirubin, UA neg    Ketones, UA neg    Spec Grav, UA 1.020 1.010 - 1.025   Blood, UA positive    pH, UA 6.0 5.0 - 8.0   Protein, UA Negative Negative   Urobilinogen, UA 0.2 0.2 or 1.0 E.U./dL   Nitrite, UA neg    Leukocytes, UA Negative Negative   Appearance     Odor      Assessment and Plan:   Ivis was seen today for sinus congestion.  Diagnoses and all orders for this visit:  Acute left-sided low back pain without sciatica; Benign essential microscopic hematuria No red flags on exam UA with blood, no leuks/nitrites Urine culture pending will add tx if indicated Repeat urinalysis send out in 2 weeks for hematuria -- order placed and patient notified; if remains positive will refer to urology -     POCT Urinalysis Dipstick -     Urine Culture -     Urinalysis, Routine w reflex microscopic; Future -     Urinalysis, Routine w reflex microscopic  Sinus congestion Low grade temp today and symptoms x 2 weeks -- will treat with oral doxycycline  Follow-up if worsening or new sx.  Other orders -     azelastine (ASTELIN) 0.1 % nasal spray; Place 2 sprays into both nostrils 2 (two) times daily. -     doxycycline (VIBRA-TABS) 100 MG tablet;  Take 1 tablet (100 mg total) by mouth 2 (two) times daily.  Inda Coke, PA-C

## 2021-05-02 ENCOUNTER — Encounter: Payer: Self-pay | Admitting: Physician Assistant

## 2021-05-02 LAB — URINALYSIS, ROUTINE W REFLEX MICROSCOPIC
Bilirubin Urine: NEGATIVE
Ketones, ur: NEGATIVE
Leukocytes,Ua: NEGATIVE
Nitrite: NEGATIVE
Specific Gravity, Urine: 1.02 (ref 1.000–1.030)
Total Protein, Urine: NEGATIVE
Urine Glucose: NEGATIVE
Urobilinogen, UA: 0.2 (ref 0.0–1.0)
WBC, UA: NONE SEEN (ref 0–?)
pH: 6 (ref 5.0–8.0)

## 2021-05-02 LAB — URINE CULTURE
MICRO NUMBER:: 12285618
SPECIMEN QUALITY:: ADEQUATE

## 2021-05-03 NOTE — Telephone Encounter (Signed)
See result notes. Pt called

## 2021-05-14 DIAGNOSIS — M5416 Radiculopathy, lumbar region: Secondary | ICD-10-CM | POA: Diagnosis not present

## 2021-05-14 DIAGNOSIS — M5136 Other intervertebral disc degeneration, lumbar region: Secondary | ICD-10-CM | POA: Diagnosis not present

## 2021-05-21 ENCOUNTER — Other Ambulatory Visit: Payer: Self-pay

## 2021-05-21 ENCOUNTER — Encounter: Payer: Self-pay | Admitting: Family Medicine

## 2021-05-21 ENCOUNTER — Ambulatory Visit (INDEPENDENT_AMBULATORY_CARE_PROVIDER_SITE_OTHER): Payer: Medicare Other | Admitting: Family Medicine

## 2021-05-21 VITALS — BP 96/58 | HR 63 | Temp 98.0°F | Ht 70.0 in | Wt 226.8 lb

## 2021-05-21 DIAGNOSIS — I10 Essential (primary) hypertension: Secondary | ICD-10-CM

## 2021-05-21 DIAGNOSIS — R319 Hematuria, unspecified: Secondary | ICD-10-CM

## 2021-05-21 DIAGNOSIS — M1991 Primary osteoarthritis, unspecified site: Secondary | ICD-10-CM | POA: Diagnosis not present

## 2021-05-21 DIAGNOSIS — R300 Dysuria: Secondary | ICD-10-CM

## 2021-05-21 LAB — URINALYSIS, ROUTINE W REFLEX MICROSCOPIC
Bilirubin Urine: NEGATIVE
Hgb urine dipstick: NEGATIVE
Leukocytes,Ua: NEGATIVE
Nitrite: NEGATIVE
RBC / HPF: NONE SEEN (ref 0–?)
Specific Gravity, Urine: 1.015 (ref 1.000–1.030)
Total Protein, Urine: NEGATIVE
Urine Glucose: NEGATIVE
Urobilinogen, UA: 0.2 (ref 0.0–1.0)
WBC, UA: NONE SEEN (ref 0–?)
pH: 6 (ref 5.0–8.0)

## 2021-05-21 LAB — POCT URINALYSIS DIPSTICK
Bilirubin, UA: NEGATIVE
Blood, UA: NEGATIVE
Glucose, UA: NEGATIVE
Ketones, UA: NEGATIVE
Leukocytes, UA: NEGATIVE
Nitrite, UA: NEGATIVE
Protein, UA: NEGATIVE
Spec Grav, UA: 1.02 (ref 1.010–1.025)
Urobilinogen, UA: 0.2 E.U./dL
pH, UA: 6 (ref 5.0–8.0)

## 2021-05-21 MED ORDER — IBUPROFEN 800 MG PO TABS: 800.0000 mg | ORAL_TABLET | Freq: Three times a day (TID) | ORAL | 0 refills | Status: DC | PRN

## 2021-05-21 NOTE — Progress Notes (Signed)
   Joseph Mcdaniel is a 75 y.o. male who presents today for an office visit.  Assessment/Plan:  New/Acute Problems: Hematuria Point-of-care urinalysis today without positive hemoglobin.  We will send out for urine microscopy.  If negative will not need to do any further work-up.  This positive result a few weeks ago may be a false positive.  Chronic Problems Addressed Today: Essential hypertension On the low side today but no symptoms.  Not currently on any medications.  Osteoarthritis Is following with orthopedics.  Currently waiting possible steroid injection.  We will refill Motrin 800 mg 3 times daily as needed.  He has done well with this in the past.     Subjective:  HPI:   Patient here for follow-up.  Seen by different provider in office a few weeks ago for sinusitis.  At times also having some back pain.  Urinalysis was negative however incidentally found positive hemoglobin.  He was told follow-up in a couple of weeks to recheck UA.  Since that he has seen orthopedics there was concern for lumbar radiculopathy.  He complains of hematuria that started few week ago. He has had a history of kidney infectious. Urine test was done and is concerned it could be kidney stones.   Denies fatigue, shortness of breath, nausea, vomiting,fever, or  other symptoms.   He still have problem with low back pain.Recently had lab done which showed pinched nerves. In addition to this,he state sinus congestion is better since last time. He was treated with oral doxycycline for this issue.        Objective:  Physical Exam: BP (!) 96/58   Pulse 63   Temp 98 F (36.7 C) (Temporal)   Ht '5\' 10"'$  (1.778 m)   Wt 226 lb 12.8 oz (102.9 kg)   SpO2 97%   BMI 32.54 kg/m   Gen: No acute distress, resting comfortably CV: Regular rate and rhythm with no murmurs appreciated Pulm: Normal work of breathing, clear to auscultation bilaterally with no crackles, wheezes, or rhonchi Neuro: Grossly normal,  moves all extremities Psych: Normal affect and thought content       I,Savera Zaman,acting as a scribe for Dimas Chyle, MD.,have documented all relevant documentation on the behalf of Dimas Chyle, MD,as directed by  Dimas Chyle, MD while in the presence of Dimas Chyle, MD.   I, Dimas Chyle, MD, have reviewed all documentation for this visit. The documentation on 05/21/21 for the exam, diagnosis, procedures, and orders are all accurate and complete.  Algis Greenhouse. Jerline Pain, MD 05/21/2021 2:19 PM

## 2021-05-21 NOTE — Assessment & Plan Note (Signed)
Is following with orthopedics.  Currently waiting possible steroid injection.  We will refill Motrin 800 mg 3 times daily as needed.  He has done well with this in the past.

## 2021-05-21 NOTE — Assessment & Plan Note (Signed)
On the low side today but no symptoms.  Not currently on any medications.

## 2021-05-22 NOTE — Progress Notes (Signed)
Please inform patient of the following:  Good news! Urine did not show any blood. Do not need to do any further testing at this point.  Joseph Mcdaniel. Jerline Pain, MD 05/22/2021 8:05 AM

## 2021-05-28 DIAGNOSIS — G4733 Obstructive sleep apnea (adult) (pediatric): Secondary | ICD-10-CM | POA: Diagnosis not present

## 2021-08-15 ENCOUNTER — Ambulatory Visit (INDEPENDENT_AMBULATORY_CARE_PROVIDER_SITE_OTHER): Payer: Medicare Other | Admitting: Family Medicine

## 2021-08-15 ENCOUNTER — Encounter: Payer: Self-pay | Admitting: Family Medicine

## 2021-08-15 ENCOUNTER — Other Ambulatory Visit: Payer: Self-pay

## 2021-08-15 VITALS — BP 118/71 | HR 61 | Temp 97.6°F | Ht 70.0 in | Wt 232.2 lb

## 2021-08-15 DIAGNOSIS — I10 Essential (primary) hypertension: Secondary | ICD-10-CM

## 2021-08-15 DIAGNOSIS — M109 Gout, unspecified: Secondary | ICD-10-CM | POA: Insufficient documentation

## 2021-08-15 MED ORDER — COLCHICINE 0.6 MG PO TABS: ORAL_TABLET | ORAL | 0 refills | Status: DC

## 2021-08-15 MED ORDER — PREDNISONE 50 MG PO TABS: ORAL_TABLET | ORAL | 0 refills | Status: DC

## 2021-08-15 NOTE — Assessment & Plan Note (Signed)
At goal.  Will avoid NSAIDs for now.

## 2021-08-15 NOTE — Assessment & Plan Note (Signed)
Pain in the left midfoot consistent with gout flare.  He has no known prior history.  We discussed treatment options.  Low utility for checking uric acid level at this point.  Do not think he needs arthrocentesis.  We will empirically start colchicine and prednisone.  He will let me know if not improving by next week.  If this becomes a recurrent issue will consider checking uric acid level and potentially starting urate lowering therapy such as allopurinol.  We discussed reasons to return to care.

## 2021-08-15 NOTE — Patient Instructions (Signed)
It was very nice to see you today!  I think you have gout in your foot.  Please start the prednisone and colchicine.  Let me know if not improving.  Please let us know if this becomes a recurrent issue so that we can talk about further testing.  Take care, Dr Jerline Pain  PLEASE NOTE:  If you had any lab tests please let us know if you have not heard back within a few days. You may see your results on mychart before we have a chance to review them but we will give you a call once they are reviewed by [place]. If we ordered any referrals today, please let us know if you have not heard from their office within the next week.   Please try these tips to maintain a healthy lifestyle:  Eat at least 3 REAL meals and 1-2 snacks per day.  Aim for no more than 5 hours between eating.  If you eat breakfast, please do so within one hour of getting up.   Each meal should contain half fruits/vegetables, one quarter protein, and one quarter carbs (no bigger than a computer mouse)  Cut down on sweet beverages. This includes juice, soda, and sweet tea.   Drink at least 1 glass of water with each meal and aim for at least 8 glasses per day  Exercise at least 150 minutes every week.

## 2021-08-15 NOTE — Progress Notes (Signed)
   Joseph Mcdaniel is a 75 y.o. male who presents today for an office visit.  Assessment/Plan:  Chronic Problems Addressed Today: Gout Pain in the left midfoot consistent with gout flare.  He has no known prior history.  We discussed treatment options.  Low utility for checking uric acid level at this point.  Do not think he needs arthrocentesis.  We will empirically start colchicine and prednisone.  He will let me know if not improving by next week.  If this becomes a recurrent issue will consider checking uric acid level and potentially starting urate lowering therapy such as allopurinol.  We discussed reasons to return to care.  Essential hypertension At goal.  Will avoid NSAIDs for now.      Subjective:  HPI:  He is here for possible gout in the feet. This has been going on for 2 days. Located on the ankle. He notes pain started at big toe and then moved to ankle. He is having hard time walking. No treatment tried other than ibuprofen which seems to be helping. Some swelling is present. He did not had any gout flare up before. No fever or chills. He have a hx of osteoarthritis. No trauma. No other precipitating event.        Objective:  Physical Exam: BP 118/71   Pulse 61   Temp 97.6 F (36.4 C) (Temporal)   Ht 5\' 10"  (1.778 m)   Wt 232 lb 3.2 oz (105.3 kg)   SpO2 98%   BMI 33.32 kg/m   Gen: No acute distress, resting comfortably CV: Regular rate and rhythm with no murmurs appreciated Pulm: Normal work of breathing, clear to auscultation bilaterally with no crackles, wheezes, or rhonchi MSK: Left foot slightly erythematous over medial midfoot.  Very tender to palpation.  No effusion noted.  Neurovascular intact distally. Neuro: Grossly normal, moves all extremities Psych: Normal affect and thought content       I,Savera Zaman,acting as a scribe for Dimas Chyle, MD.,have documented all relevant documentation on the behalf of Dimas Chyle, MD,as directed by  Dimas Chyle, MD while in the presence of Dimas Chyle, MD.   I, Dimas Chyle, MD, have reviewed all documentation for this visit. The documentation on 08/15/21 for the exam, diagnosis, procedures, and orders are all accurate and complete.  Algis Greenhouse. Jerline Pain, MD 08/15/2021 10:19 AM

## 2021-09-04 ENCOUNTER — Other Ambulatory Visit: Payer: Self-pay | Admitting: Family Medicine

## 2021-10-11 ENCOUNTER — Encounter: Payer: Self-pay | Admitting: Family Medicine

## 2021-10-11 ENCOUNTER — Other Ambulatory Visit: Payer: Self-pay

## 2021-10-11 ENCOUNTER — Ambulatory Visit (INDEPENDENT_AMBULATORY_CARE_PROVIDER_SITE_OTHER): Payer: Medicare Other | Admitting: Family Medicine

## 2021-10-11 VITALS — BP 111/70 | HR 63 | Temp 98.1°F | Ht 68.5 in | Wt 233.0 lb

## 2021-10-11 DIAGNOSIS — I10 Essential (primary) hypertension: Secondary | ICD-10-CM | POA: Diagnosis not present

## 2021-10-11 MED ORDER — AZELASTINE HCL 0.1 % NA SOLN: 2.0000 | Freq: Two times a day (BID) | NASAL | 12 refills | Status: DC

## 2021-10-11 MED ORDER — LEVOFLOXACIN 500 MG PO TABS: 500.0000 mg | ORAL_TABLET | Freq: Every day | ORAL | 0 refills | Status: DC

## 2021-10-11 MED ORDER — PREDNISONE 50 MG PO TABS: ORAL_TABLET | ORAL | 0 refills | Status: DC

## 2021-10-11 NOTE — Patient Instructions (Signed)
It was very nice to see you today!  We will send in another round of antibiotics and prednisone.  Please use the nasal spray.  Let me know if not improving by next week and we can refer you to see the ear nose and throat doctor.  Take care, Dr Jerline Pain  PLEASE NOTE:  If you had any lab tests please let us know if you have not heard back within a few days. You may see your results on mychart before we have a chance to review them but we will give you a call once they are reviewed by [place]. If we ordered any referrals today, please let us know if you have not heard from their office within the next week.   Please try these tips to maintain a healthy lifestyle:  Eat at least 3 REAL meals and 1-2 snacks per day.  Aim for no more than 5 hours between eating.  If you eat breakfast, please do so within one hour of getting up.   Each meal should contain half fruits/vegetables, one quarter protein, and one quarter carbs (no bigger than a computer mouse)  Cut down on sweet beverages. This includes juice, soda, and sweet tea.   Drink at least 1 glass of water with each meal and aim for at least 8 glasses per day  Exercise at least 150 minutes every week.

## 2021-10-11 NOTE — Progress Notes (Signed)
° °  Joseph Mcdaniel is a 76 y.o. male who presents today for an office visit.  Assessment/Plan:  Sinusitis No red flags.  Given length of symptoms we will start antibiotics.  He has not had much success with azithromycin or doxycycline in the past.  He has a penicillin allergy.  We will try course of Levaquin.  Also start prednisone burst and Astelin.  If not improving by next week he will need referral to ENT.  We discussed referral today however he would like to defer for now until he has trial of treatment.  We discussed reasons to return to care.  He can continue over-the-counter medications.     Subjective:  HPI:  Patient here with sinus congestion.  This has been an ongoing issue for the past year and a half ever since he had COVID.  We have treated him a couple of times for sinus infection with prednisone, antibiotics, and Astelin.  This usually helps for a while however symptoms come back.  Symptoms have worsened recently.  He uses a CPAP machine at night which he thinks may be contributing.  No fevers or chills.  No reported chest pain or shortness of breath.  He has thick yellow mucus       Objective:  Physical Exam: BP 111/70 (BP Location: Right Arm)    Pulse 63    Temp 98.1 F (36.7 C) (Temporal)    Ht 5' 8.5" (1.74 m)    Wt 233 lb (105.7 kg)    SpO2 95%    BMI 34.91 kg/m   Gen: No acute distress, resting comfortably HEENT: TMs clear.  OP erythematous.  Nasal mucosa erythematous and boggy bilaterally. CV: Regular rate and rhythm with no murmurs appreciated Pulm: Normal work of breathing, clear to auscultation bilaterally with no crackles, wheezes, or rhonchi Neuro: Grossly normal, moves all extremities Psych: Normal affect and thought content      Feliz Herard M. Jerline Pain, MD 10/11/2021 3:13 PM

## 2021-10-22 ENCOUNTER — Telehealth: Payer: Self-pay

## 2021-10-22 NOTE — Telephone Encounter (Signed)
Please see note and advise  

## 2021-10-22 NOTE — Telephone Encounter (Signed)
Patient would like the referral sent in for a ENT like him and Dr. Jerline Pain had discussed. Patient would like it sent to Dr. Constance Holster at [facility].

## 2021-10-23 ENCOUNTER — Other Ambulatory Visit: Payer: Self-pay

## 2021-10-23 DIAGNOSIS — R059 Cough, unspecified: Secondary | ICD-10-CM

## 2021-10-23 DIAGNOSIS — J329 Chronic sinusitis, unspecified: Secondary | ICD-10-CM

## 2021-10-23 NOTE — Telephone Encounter (Signed)
Called pt and spoke w/ spouse, verified DOB. Advised referral placed to Dr Constance Holster and advise non preferred provider they would need to check with insurance to see if it's out of network. They can let us know if needing referral placed with preferred provider. Pt verbalized understanding

## 2021-10-23 NOTE — Telephone Encounter (Signed)
Ok with me. Please place any necessary orders. 

## 2021-11-03 DIAGNOSIS — R001 Bradycardia, unspecified: Secondary | ICD-10-CM | POA: Insufficient documentation

## 2021-11-03 NOTE — Progress Notes (Signed)
Cardiology Office Note   Date:  11/06/2021   ID:  Joseph Mcdaniel, DOB 1945/11/11, MRN [id number]  PCP:  Vivi Barrack, MD  Cardiologist:   Minus Breeding, MD Referring:  Vivi Barrack, MD  Chief Complaint  Patient presents with   Palpitations      History of Present Illness: Joseph Mcdaniel is a 76 y.o. male who presents for follow up of atrial fibrillation and in 2013 spontaneously converted to normal sinus rhythm.  CHA2DS2-VASc score was 0 at the time and there was no need for anticoagulation.  Cardiac CT in 2011 normal coronary arteries and calcium score of 0.  Negative Lexiscan Myoview for ischemia for chest pain 07/2017.  He had a reduced EF on echo in 2018 with an EF of 40 -45%.     We have not seen him since 2019.  He was seen because of bradycardia.  He returns now because he had tach arrhythmia.  This was almost definitely atrial fibrillation.  He had rates in the 130s.  This lasted for about 6 hours.  It spontaneously converted.  He records his heart rate on his Fitbit but it did not record an EKG.  His wife was able to see an EKG and saw that it said atrial fibrillation.  He felt "lousy".  He has chronic constipation and felt really severe that day.  He was upset emotionally.  He had had atrial fibrillation he does not think since 2019.  Otherwise he feels okay.  His wife did note that his blood pressure dropped while he was in atrial fibrillation but he did not have any presyncope or syncope.  He runs a low blood pressure now but he says he feels okay.  He denies any chest pressure, neck or arm discomfort but he is otherwise had no new shortness of breath, PND or orthopnea.  He had no weight gain or edema.   Past Medical History:  Diagnosis Date   A-fib [facility]) with spontaneous convert to NSR 08/2012   Adenomatous colon polyp 09/2007   Anxiety    Cardiac CT on 5/11, calcium score 0    Cataracts, bilateral    DDD (degenerative disc disease)    ED (erectile  dysfunction)    Fibromyalgia    Generalized OA    GERD (gastroesophageal reflux disease)    Myalgia and myositis    OSA (obstructive sleep apnea), compliant with CPAP    Sleep apnea    cpap   Vitamin D deficiency     Past Surgical History:  Procedure Laterality Date   CATARACT EXTRACTION Right 02/2019   CHONDROPLASTY Left    COLONOSCOPY     LIPOMA EXCISION     LUMBAR LAMINECTOMY     POLYPECTOMY     ROTATOR CUFF REPAIR Right    UPPER GASTROINTESTINAL ENDOSCOPY       Current Outpatient Medications  Medication Sig Dispense Refill   aspirin 81 MG chewable tablet Chew 81 mg by mouth daily.      Ca Carbonate-Mag Hydroxide (ROLAIDS PO) Take by mouth as needed.     Cholecalciferol (VITAMIN D) 125 MCG (5000 UT) CAPS Take 1 capsule by mouth daily.     Coenzyme Q10 (COQ10) 100 MG CAPS Take by mouth.     ibuprofen (ADVIL) 800 MG tablet TAKE 1 TABLET BY MOUTH EVERY 8 HOURS AS NEEDED 30 tablet 0   mineral oil liquid Take 15 mLs by mouth at bedtime as needed for moderate constipation.  Multiple Vitamin (MULTIVITAMIN WITH MINERALS) TABS tablet Take 1 tablet by mouth daily.     omeprazole (PRILOSEC) 20 MG capsule Take 1 capsule (20 mg total) by mouth daily. 90 capsule 3   tadalafil (CIALIS) 5 MG tablet Take 1 tablet (5 mg total) by mouth daily as needed. Erectile dysfunction 10 tablet 0   Zinc 30 MG CAPS Take by mouth.     No current facility-administered medications for this visit.    Allergies:   Codeine, Hydrocodone, Omeprazole, and Penicillins    Social History:  The patient  reports that he quit smoking about 27 years ago. His smoking use included cigarettes. He has a 45.00 pack-year smoking history. He has never used smokeless tobacco. He reports current alcohol use of about 3.0 standard drinks per week. He reports that he does not use drugs.   Family History:  The patient's family history includes Breast cancer in his mother; Diabetes in his maternal aunt; Heart attack (age of  onset: 51) in his father.    ROS:  Please see the history of present illness.   Otherwise, review of systems are positive for none.   All other systems are reviewed and negative.    PHYSICAL EXAM: VS:  BP 94/62 (BP Location: Left Arm, Patient Position: Sitting, Cuff Size: Normal)    Pulse 70    Ht 5' 8.5" (1.74 m)    Wt 231 lb (104.8 kg)    BMI 34.61 kg/m  , BMI Body mass index is 34.61 kg/m. GENERAL:  Well appearing HEENT:  Pupils equal round and reactive, fundi not visualized, oral mucosa unremarkable NECK:  No jugular venous distention, waveform within normal limits, carotid upstroke brisk and symmetric, no bruits, no thyromegaly LYMPHATICS:  No cervical, inguinal adenopathy LUNGS:  Clear to auscultation bilaterally BACK:  No CVA tenderness CHEST:  Unremarkable HEART:  PMI not displaced or sustained,S1 and S2 within normal limits, no S3, no S4, no clicks, no rubs, no murmurs ABD:  Flat, positive bowel sounds normal in frequency in pitch, no bruits, no rebound, no guarding, no midline pulsatile mass, no hepatomegaly, no splenomegaly EXT:  2 plus pulses throughout, no edema, no cyanosis no clubbing SKIN:  No rashes no nodules NEURO:  Cranial nerves II through XII grossly intact, motor grossly intact throughout PSYCH:  Cognitively intact, oriented to person place and time    EKG:  EKG is ordered today. The ekg ordered today demonstrates normal sinus rhythm, rate 70, leftward axis, early transition lead V2, no acute ST-T wave changes.   Recent Labs: No results found for requested labs within last 8760 hours.    Lipid Panel    Component Value Date/Time   CHOL 161 02/21/2019 1516   TRIG 208.0 (H) 02/21/2019 1516   HDL 35.20 (L) 02/21/2019 1516   CHOLHDL 5 02/21/2019 1516   VLDL 41.6 (H) 02/21/2019 1516   LDLCALC 103 (H) 12/29/2017 1527   LDLDIRECT 107.0 02/21/2019 1516      Wt Readings from Last 3 Encounters:  11/05/21 231 lb (104.8 kg)  10/11/21 233 lb (105.7 kg)   08/15/21 232 lb 3.2 oz (105.3 kg)      Other studies Reviewed: Additional studies/ records that were reviewed today include: None. Review of the above records demonstrates:  Please see elsewhere in the note.     ASSESSMENT AND PLAN:  PAF: I am sure the patient had PAF.  I am going to have him wear a 2-week event monitor.  They are also going  to learn to record EKGs with her watch.  However, because this was the first episode recorded in several years and it was self-limited I am not yet going to prescribe chronic DOAC.  We will make this decision based on future episodes or the results of his monitor.  HTN: His blood pressure is running low.  This does not allow med titration at this point.  Sleep apnea: He wears a CPAP.  Dilated cardiomyopathy: I will repeat an echocardiogram.  The patient and his wife did not seem to be aware of his reduced ejection fraction but we reviewed this  Current medicines are reviewed at length with the patient today.  The patient does not have concerns regarding medicines.  The following changes have been made:  no change  Labs/ tests ordered today include:   Orders Placed This Encounter  Procedures   LONG TERM MONITOR (3-14 DAYS)   EKG 12-Lead   ECHOCARDIOGRAM COMPLETE     Disposition:   FU with me  in six months.    Signed, Minus Breeding, MD  11/06/2021 12:46 PM    [facility]

## 2021-11-05 ENCOUNTER — Other Ambulatory Visit: Payer: Self-pay

## 2021-11-05 ENCOUNTER — Ambulatory Visit: Payer: Medicare Other | Admitting: Cardiology

## 2021-11-05 ENCOUNTER — Ambulatory Visit (INDEPENDENT_AMBULATORY_CARE_PROVIDER_SITE_OTHER): Payer: Medicare Other

## 2021-11-05 VITALS — BP 94/62 | HR 70 | Ht 68.5 in | Wt 231.0 lb

## 2021-11-05 DIAGNOSIS — I420 Dilated cardiomyopathy: Secondary | ICD-10-CM

## 2021-11-05 DIAGNOSIS — I10 Essential (primary) hypertension: Secondary | ICD-10-CM | POA: Diagnosis not present

## 2021-11-05 DIAGNOSIS — I480 Paroxysmal atrial fibrillation: Secondary | ICD-10-CM

## 2021-11-05 DIAGNOSIS — R001 Bradycardia, unspecified: Secondary | ICD-10-CM

## 2021-11-05 NOTE — Patient Instructions (Addendum)
Medication Instructions:  No changes *If you need a refill on your cardiac medications before your next appointment, please call your pharmacy*   Lab Work: None ordered If you have labs (blood work) drawn today and your tests are completely normal, you will receive your results only by: [place] (if you have MyChart) OR A paper copy in the mail If you have any lab test that is abnormal or we need to change your treatment, we will call you to review the results.   Testing/Procedures: Your physician has requested that you have an echocardiogram. Echocardiography is a painless test that uses sound waves to create images of your heart. It provides your doctor with information about the size and shape of your heart and how well your hearts chambers and valves are working. You may receive an ultrasound enhancing agent through an IV if needed to better visualize your heart during the echo.This procedure takes approximately one hour. There are no restrictions for this procedure. This will take place at the [address], [address].   ZIO XT- Long Term Monitor Instructions  Your physician has requested you wear a ZIO patch monitor for 14 days.  This is a single patch monitor. [employer] supplies one patch monitor per enrollment. Additional stickers are not available. Please do not apply patch if you will be having a Nuclear Stress Test,  Echocardiogram, Cardiac CT, MRI, or Chest Xray during the period you would be wearing the  monitor. The patch cannot be worn during these tests. You cannot remove and re-apply the  ZIO XT patch monitor.  Your ZIO patch monitor will be mailed 3 day USPS to your address on file. It may take 3-5 days  to receive your monitor after you have been enrolled.  Once you have received your monitor, please review the enclosed instructions. Your monitor  has already been registered assigning a specific monitor serial # to you.  Billing and Patient Assistance Program  Information  We have supplied [employer] with any of your insurance information on file for billing purposes. [employer] offers a sliding scale Patient Assistance Program for patients that do not have  insurance, or whose insurance does not completely cover the cost of the ZIO monitor.  You must apply for the Patient Assistance Program to qualify for this discounted rate.  To apply, please call [employer] at [phone], select option 4, select option 2, ask to apply for  Patient Assistance Program. Theodore Demark will ask your household income, and how many people  are in your household. They will quote your out-of-pocket cost based on that information.  [employer] will also be able to set up a 44-month, interest-free payment plan if needed.  Applying the monitor   Shave hair from upper left chest.  Hold abrader disc by orange tab. Rub abrader in 40 strokes over the upper left chest as  indicated in your monitor instructions.  Clean area with 4 enclosed alcohol pads. Let dry.  Apply patch as indicated in monitor instructions. Patch will be placed under collarbone on left  side of chest with arrow pointing upward.  Rub patch adhesive wings for 2 minutes. Remove white label marked "1". Remove the white  label marked "2". Rub patch adhesive wings for 2 additional minutes.  While looking in a mirror, press and release button in center of patch. A small green light will  flash 3-4 times. This will be your only indicator that the monitor has been turned on.  Do not shower  for the first 24 hours. You may shower after the first 24 hours.  Press the button if you feel a symptom. You will hear a small click. Record Date, Time and  Symptom in the Patient Logbook.  When you are ready to remove the patch, follow instructions on the last 2 pages of Patient  Logbook. Stick patch monitor onto the last page of Patient Logbook.  Place Patient Logbook in the blue and white box. Use locking tab on box and tape box closed   securely. The blue and white box has prepaid postage on it. Please place it in the mailbox as  soon as possible. Your physician should have your test results approximately 7 days after the  monitor has been mailed back to [facility].  Call [place] at [phone] if you have questions regarding  your ZIO XT patch monitor. Call them immediately if you see an orange light blinking on your  monitor.  If your monitor falls off in less than 4 days, contact our Monitor department at [phone].  If your monitor becomes loose or falls off after 4 days call [employer] at [phone] for  suggestions on securing your monitor   Follow-Up: At [facility], you and your health needs are our priority.  As part of our continuing mission to provide you with exceptional heart care, we have created designated Provider Care Teams.  These Care Teams include your primary Cardiologist (physician) and Advanced Practice Providers (APPs -  Physician Assistants and Nurse Practitioners) who all work together to provide you with the care you need, when you need it.  We recommend signing up for the patient portal called "MyChart".  Sign up information is provided on this After Visit Summary.  MyChart is used to connect with patients for Virtual Visits (Telemedicine).  Patients are able to view lab/test results, encounter notes, upcoming appointments, etc.  Non-urgent messages can be sent to your provider as well.   To learn more about what you can do with MyChart, go to [email or web address].    Your next appointment:   6 month(s)  The format for your next appointment:   In Person  Provider:   Minus Breeding, MD {

## 2021-11-05 NOTE — Progress Notes (Unsigned)
Enrolled patient for a 14 day Zio XT  monitor to be mailed to patients home  °

## 2021-11-06 ENCOUNTER — Encounter: Payer: Self-pay | Admitting: Cardiology

## 2021-11-08 DIAGNOSIS — I480 Paroxysmal atrial fibrillation: Secondary | ICD-10-CM | POA: Diagnosis not present

## 2021-11-11 ENCOUNTER — Ambulatory Visit: Payer: Medicare Other | Admitting: Cardiology

## 2021-11-13 ENCOUNTER — Other Ambulatory Visit: Payer: Self-pay

## 2021-11-13 ENCOUNTER — Ambulatory Visit (HOSPITAL_COMMUNITY): Payer: Medicare Other | Attending: Cardiovascular Disease

## 2021-11-13 DIAGNOSIS — I480 Paroxysmal atrial fibrillation: Secondary | ICD-10-CM | POA: Diagnosis not present

## 2021-11-13 LAB — ECHOCARDIOGRAM COMPLETE
Area-P 1/2: 2.32 cm2
P 1/2 time: 464 msec
S' Lateral: 3.8 cm

## 2021-11-25 DIAGNOSIS — G4733 Obstructive sleep apnea (adult) (pediatric): Secondary | ICD-10-CM | POA: Diagnosis not present

## 2021-11-28 DIAGNOSIS — I480 Paroxysmal atrial fibrillation: Secondary | ICD-10-CM | POA: Diagnosis not present

## 2022-01-02 ENCOUNTER — Encounter: Payer: Self-pay | Admitting: Gastroenterology

## 2022-01-02 NOTE — Telephone Encounter (Signed)
Pt's spouse did not know where and when ENT appointment would occur. ?Spouse states [facility] called and told them the ENT appt.  ? ?Found information by calling specialty. ?[facility] ?[address] ?[place], [place] [address] ?[phone] ? ?With PA Mr. Ashford ?

## 2022-01-03 ENCOUNTER — Ambulatory Visit: Payer: Medicare Other | Admitting: Gastroenterology

## 2022-01-03 ENCOUNTER — Encounter: Payer: Self-pay | Admitting: Gastroenterology

## 2022-01-03 VITALS — BP 104/72 | HR 63 | Ht 68.0 in | Wt 229.0 lb

## 2022-01-03 DIAGNOSIS — K219 Gastro-esophageal reflux disease without esophagitis: Secondary | ICD-10-CM | POA: Diagnosis not present

## 2022-01-03 DIAGNOSIS — Z86010 Personal history of colonic polyps: Secondary | ICD-10-CM

## 2022-01-03 DIAGNOSIS — K5900 Constipation, unspecified: Secondary | ICD-10-CM

## 2022-01-03 DIAGNOSIS — Z79899 Other long term (current) drug therapy: Secondary | ICD-10-CM

## 2022-01-03 MED ORDER — PLENVU 140 G PO SOLR: 1.0000 | ORAL | 0 refills | Status: DC

## 2022-01-03 NOTE — Progress Notes (Signed)
? ?HPI :  ?76 year old male with a history of GERD, colon polyps, constipation, here for a follow-up visit. ? ?Recall at her last visit we discussed his history of reflux which she has had for a long time.  He is currently taking omeprazole once every few days and this is working pretty well to control his symptoms.  He had an EGD with me June 2022 which did not show any Barrett's esophagus, some very mild inflammatory changes which appear to be healing on PPI.  No high risk lesions.  He continues to take his omeprazole every few days, works well for him at baseline, no dysphagia. ? ?His last colonoscopy was performed in June 2020, at that point time he had 5 adenomas and was told to repeat a colonoscopy in 3 years.  For the past 6 months or so he has had change in bowel habits.  He has had problematic constipation, states he passes hard stool once every 2 to 3 days if he does not take anything for this.  He has been using MiraLAX, Benefiber, Dulcolax, and mineral oil, all as needed.  The most of MiraLAX he is taking has been once every other day.  He states this regimen in general with what he has been taking has not really helped him and he inquires about other options.  No blood in the stools.  If he does not have a bowel movement for up to 3 days he will have some abdominal discomfort.  No weight loss or alarm symptoms otherwise. ? ?He denies any symptoms from a cardiovascular perspective.  He has a history of A-fib which she has had episodes of once every 3 to 4 years, he is not on any chronic anticoagulation and denies any baseline symptoms from this.  Takes aspirin daily.  Otherwise feeling well without complaints today.  He had a recent echocardiogram which looked good, normal EF. ?  ?Prior evaluation: ?Colonoscopy 02/23/19 - The perianal and digital rectal examinations were normal. ?- Two sessile polyps were found in the cecum. The polyps were diminutive in size. These ?polyps were removed with a cold  biopsy forceps. Resection and retrieval were complete. ?- Two sessile polyps were found in the transverse colon. The polyps were 3 mm in size. ?These polyps were removed with a cold snare. Resection and retrieval were complete. ?- A 3 mm polyp was found in the descending colon. The polyp was sessile. The polyp was ?removed with a cold snare. Resection and retrieval were complete. ?- A 3 mm polyp was found in the sigmoid colon. The polyp was sessile. The polyp was ?removed with a cold snare. Resection and retrieval were complete. ?- There was a small lipoma, in the sigmoid colon. ?- Internal hemorrhoids were found. ?- The prep was fair on insertion. Several minutes spent lavaging the colon to achieve ?adequate views. The exam was otherwise without abnormality. ?  ?Surgical [P], cecum (cold forcep), transverse, descending and sigmoid, polyp (5) ?- TUBULAR ADENOMA(S). ?- INFLAMMATORY POLYP. ?- NO HIGH GRADE DYSPLASIA OR MALIGNANCY. ?  ?Recommended repeat in 3 years ?  ?Colonoscopy 01/04/16 - The perianal and digital rectal examinations were normal. ?- A 4 mm polyp was found in the ascending colon. The polyp was sessile. The polyp was ?removed with a cold snare. Resection and retrieval were complete. ?- A 5 mm polyp was found in the transverse colon. The polyp was sessile. The polyp was ?removed with a cold snare. Resection and retrieval were complete. ?- A  4 mm polyp was found in the sigmoid colon. The polyp was sessile. The polyp was ?removed with a cold snare. Resection and retrieval were complete. ?- The terminal ileum appeared normal. ?- Non-bleeding internal hemorrhoids were found during retroflexion. The hemorrhoids were ?small. ?- The exam was otherwise without abnormality. No abnormality noted which could cause the ?patient's changes in bowel symptoms, which perhaps were due to change in medications ?(narcotics) ?  ?  ?Colonoscopy 11/05/2011 - lipoma, hyperplastic polyps ?Colonoscopy 09/2007 - 1cm polyp -  tubulovillous adenoma ?  ?Nuclear stress 08/21/17 - normal exam, EF 47% ?  ? EGD 02/27/21: ?- Very mild focal / healing esophagitis was found 39 cm from the incisors. ?- The exam of the esophagus was otherwise normal. No Barrett's esophagus ?- The entire examined stomach was normal. ?- The duodenal bulb and second portion of the duodenum were normal. ? ? ?Echo 11/13/21: ?EF 55-60%, valves okay ? ? ?Past Medical History:  ?Diagnosis Date  ? A-fib [facility]) with spontaneous convert to NSR 08/2012  ? Adenomatous colon polyp 09/2007  ? Anxiety   ? Cardiac CT on 5/11, calcium score 0   ? Cataracts, bilateral   ? DDD (degenerative disc disease)   ? ED (erectile dysfunction)   ? Fibromyalgia   ? Generalized OA   ? GERD (gastroesophageal reflux disease)   ? Myalgia and myositis   ? OSA (obstructive sleep apnea), compliant with CPAP   ? Sleep apnea   ? cpap  ? Vitamin D deficiency   ? ? ? ?Past Surgical History:  ?Procedure Laterality Date  ? CATARACT EXTRACTION Right 02/2019  ? CHONDROPLASTY Left   ? COLONOSCOPY    ? LIPOMA EXCISION    ? LUMBAR LAMINECTOMY    ? POLYPECTOMY    ? ROTATOR CUFF REPAIR Right   ? UPPER GASTROINTESTINAL ENDOSCOPY    ? ?Family History  ?Problem Relation Age of Onset  ? Breast cancer Mother   ? Diabetes Maternal Aunt   ? Heart attack Father 61  ? Colon cancer Neg Hx   ? Colon polyps Neg Hx   ? Rectal cancer Neg Hx   ? Stomach cancer Neg Hx   ? Esophageal cancer Neg Hx   ? ?Social History  ? ?Tobacco Use  ? Smoking status: Former  ?  Packs/day: 1.50  ?  Years: 30.00  ?  Pack years: 45.00  ?  Types: Cigarettes  ?  Quit date: 09/08/1994  ?  Years since quitting: 27.3  ? Smokeless tobacco: Never  ? Tobacco comments:  ?  Quit smoking 1996, smoked for 30 years, pt smoke 3 packs per day for 1-2 years.   ?Vaping Use  ? Vaping Use: Never used  ?Substance Use Topics  ? Alcohol use: Yes  ?  Alcohol/week: 3.0 standard drinks  ?  Types: 3 Cans of beer per week  ?  Comment: occ beer on weekends  ? Drug use: No  ? ?Current  Outpatient Medications  ?Medication Sig Dispense Refill  ? aspirin 81 MG chewable tablet Chew 81 mg by mouth daily.     ? Ca Carbonate-Mag Hydroxide (ROLAIDS PO) Take by mouth as needed.    ? Cholecalciferol (VITAMIN D) 125 MCG (5000 UT) CAPS Take 1 capsule by mouth daily.    ? Coenzyme Q10 (COQ10) 100 MG CAPS Take by mouth.    ? ibuprofen (ADVIL) 800 MG tablet TAKE 1 TABLET BY MOUTH EVERY 8 HOURS AS NEEDED 30 tablet 0  ?  mineral oil liquid Take 15 mLs by mouth at bedtime as needed for moderate constipation.    ? Multiple Vitamin (MULTIVITAMIN WITH MINERALS) TABS tablet Take 1 tablet by mouth daily.    ? omeprazole (PRILOSEC) 20 MG capsule Take 1 capsule (20 mg total) by mouth daily. 90 capsule 3  ? tadalafil (CIALIS) 5 MG tablet Take 1 tablet (5 mg total) by mouth daily as needed. Erectile dysfunction 10 tablet 0  ? Zinc 30 MG CAPS Take by mouth.    ? ?No current facility-administered medications for this visit.  ? ?Allergies  ?Allergen Reactions  ? Codeine Itching  ? Hydrocodone Itching  ? Omeprazole   ?  Reaction unknown  ? Penicillins Hives and Itching  ?  Has patient had a PCN reaction causing immediate rash, facial/tongue/throat swelling, SOB or lightheadedness with hypotension: Yes ?Has patient had a PCN reaction causing severe rash involving mucus membranes or skin necrosis: No ?Has patient had a PCN reaction that required hospitalization: No ?Has patient had a PCN reaction occurring within the last 10 years: No ?If all of the above answers are "NO", then may proceed with Cephalosporin use. ?  ? ? ? ?Review of Systems: ?All systems reviewed and negative except where noted in HPI.  ? ? ?Lab Results  ?Component Value Date  ? WBC 8.5 02/21/2019  ? HGB 14.6 02/21/2019  ? HCT 43.7 02/21/2019  ? MCV 90.8 02/21/2019  ? PLT 175.0 02/21/2019  ? ? ?Lab Results  ?Component Value Date  ? CREATININE 0.95 02/21/2019  ? BUN 27 (H) 02/21/2019  ? NA 141 02/21/2019  ? K 3.5 02/21/2019  ? CL 105 02/21/2019  ? CO2 28 02/21/2019   ? ? ?Lab Results  ?Component Value Date  ? ALT 18 02/21/2019  ? AST 16 02/21/2019  ? ALKPHOS 63 02/21/2019  ? BILITOT 0.5 02/21/2019  ? ? ? ?Physical Exam: ?BP 104/72   Pulse 63   Ht '5\' 8"'$  (1.727 m)   Wt 229 lb

## 2022-01-03 NOTE — Patient Instructions (Signed)
Please purchase the following medications over the counter and take as directed: ?Miralax 17 grams (1 capful) dissolved in at least 8 ounces water/juice twice daily. You may titrate this dose as needed ? ?We have given you samples of the following medication to take: ?Linzess 72 mcg- Take 1-2 tablets daily AS NEEDED should the Miralax not be effective. ? ?You have been scheduled for a colonoscopy. Please follow written instructions given to you at your visit today.  ?Please pick up your prep supplies at the pharmacy within the next 1-3 days. ?If you use inhalers (even only as needed), please bring them with you on the day of your procedure. ? ?If you are age 80 or older, your body mass index should be between 23-30. Your Body mass index is 34.82 kg/m?Marland Kitchen If this is out of the aforementioned range listed, please consider follow up with your Primary Care Provider. ? ?If you are age 60 or younger, your body mass index should be between 19-25. Your Body mass index is 34.82 kg/m?Marland Kitchen If this is out of the aformentioned range listed, please consider follow up with your Primary Care Provider.  ? ?________________________________________________________ ? ?The [facility] providers would like to encourage you to use [facility] to communicate with providers for non-urgent requests or questions.  Due to long hold times on the telephone, sending your provider a message by [facility] may be a faster and more efficient way to get a response.  Please allow 48 business hours for a response.  Please remember that this is for non-urgent requests.  ?_______________________________________________________ ? ?Due to recent changes in healthcare laws, you may see the results of your imaging and laboratory studies on MyChart before your provider has had a chance to review them.  We understand that in some cases there may be results that are confusing or concerning to you. Not all laboratory results come back in the same time frame and the provider  may be waiting for multiple results in order to interpret others.  Please give [place] 48 hours in order for your provider to thoroughly review all the results before contacting the office for clarification of your results.  ? ?

## 2022-01-07 DIAGNOSIS — J329 Chronic sinusitis, unspecified: Secondary | ICD-10-CM | POA: Diagnosis not present

## 2022-01-07 DIAGNOSIS — J342 Deviated nasal septum: Secondary | ICD-10-CM | POA: Diagnosis not present

## 2022-01-07 DIAGNOSIS — R053 Chronic cough: Secondary | ICD-10-CM | POA: Diagnosis not present

## 2022-01-27 ENCOUNTER — Telehealth: Payer: Self-pay | Admitting: Family Medicine

## 2022-01-27 DIAGNOSIS — J342 Deviated nasal septum: Secondary | ICD-10-CM | POA: Diagnosis not present

## 2022-01-27 DIAGNOSIS — J3489 Other specified disorders of nose and nasal sinuses: Secondary | ICD-10-CM | POA: Diagnosis not present

## 2022-01-27 DIAGNOSIS — J329 Chronic sinusitis, unspecified: Secondary | ICD-10-CM | POA: Diagnosis not present

## 2022-01-27 DIAGNOSIS — J341 Cyst and mucocele of nose and nasal sinus: Secondary | ICD-10-CM | POA: Diagnosis not present

## 2022-01-27 DIAGNOSIS — R059 Cough, unspecified: Secondary | ICD-10-CM | POA: Diagnosis not present

## 2022-01-27 NOTE — Telephone Encounter (Signed)
Copied from [place] [id number]. Topic: Medicare AWV >> Jan 27, 2022  2:03 PM Harris-Coley, Hannah Beat wrote: Reason for CRM: Left message for patient to schedule Annual Wellness Visit.  Please schedule with Nurse Health Advisor Charlott Rakes, RN at [facility].  Please call [phone] ask for [facility]

## 2022-01-28 ENCOUNTER — Encounter: Payer: Self-pay | Admitting: Gastroenterology

## 2022-02-04 ENCOUNTER — Telehealth: Payer: Self-pay | Admitting: Gastroenterology

## 2022-02-04 ENCOUNTER — Encounter: Payer: Self-pay | Admitting: Gastroenterology

## 2022-02-04 NOTE — Telephone Encounter (Signed)
Good Afternoon,   Patients wife called states patient has a prescription for prep medication [id number]. Patients wife states the prescription cost is $156. Patients wife states the pharmacy is out of stock with that prescription and also the cost is non affordable. Patients wife is seeking other prep medications that are more affordable. Patients wife is requesting for a nurse to give her a call back on her personal cell. Please give them a call back to advise.  Thank You.

## 2022-02-04 NOTE — Telephone Encounter (Signed)
Called and spoke to wife, Mardene Celeste. Let her know that I sent new codes to [facility] that will drastically reduce the price. They should run those and notify the patient. She expressed understanding and thanked me for the call

## 2022-02-10 ENCOUNTER — Ambulatory Visit (AMBULATORY_SURGERY_CENTER): Payer: Medicare Other | Admitting: Gastroenterology

## 2022-02-10 ENCOUNTER — Encounter: Payer: Self-pay | Admitting: Gastroenterology

## 2022-02-10 VITALS — BP 127/78 | HR 52 | Temp 98.2°F | Resp 12 | Ht 68.0 in | Wt 229.0 lb

## 2022-02-10 DIAGNOSIS — D123 Benign neoplasm of transverse colon: Secondary | ICD-10-CM | POA: Diagnosis not present

## 2022-02-10 DIAGNOSIS — M797 Fibromyalgia: Secondary | ICD-10-CM | POA: Diagnosis not present

## 2022-02-10 DIAGNOSIS — K635 Polyp of colon: Secondary | ICD-10-CM | POA: Diagnosis not present

## 2022-02-10 DIAGNOSIS — D120 Benign neoplasm of cecum: Secondary | ICD-10-CM

## 2022-02-10 DIAGNOSIS — Z86010 Personal history of colonic polyps: Secondary | ICD-10-CM | POA: Diagnosis not present

## 2022-02-10 DIAGNOSIS — G4733 Obstructive sleep apnea (adult) (pediatric): Secondary | ICD-10-CM | POA: Diagnosis not present

## 2022-02-10 MED ORDER — SODIUM CHLORIDE 0.9 % IV SOLN: 500.0000 mL | Freq: Once | INTRAVENOUS | Status: DC

## 2022-02-10 NOTE — Patient Instructions (Signed)
Please read handouts provided. Continue present medications. Await pathology results.   YOU HAD AN ENDOSCOPIC PROCEDURE TODAY AT THE [facility]:   Refer to the procedure report that was given to you for any specific questions about what was found during the examination.  If the procedure report does not answer your questions, please call your gastroenterologist to clarify.  If you requested that your care partner not be given the details of your procedure findings, then the procedure report has been included in a sealed envelope for you to review at your convenience later.  YOU SHOULD EXPECT: Some feelings of bloating in the abdomen. Passage of more gas than usual.  Walking can help get rid of the air that was put into your GI tract during the procedure and reduce the bloating. If you had a lower endoscopy (such as a colonoscopy or flexible sigmoidoscopy) you may notice spotting of blood in your stool or on the toilet paper. If you underwent a bowel prep for your procedure, you may not have a normal bowel movement for a few days.  Please Note:  You might notice some irritation and congestion in your nose or some drainage.  This is from the oxygen used during your procedure.  There is no need for concern and it should clear up in a day or so.  SYMPTOMS TO REPORT IMMEDIATELY:  Following lower endoscopy (colonoscopy or flexible sigmoidoscopy):  Excessive amounts of blood in the stool  Significant tenderness or worsening of abdominal pains  Swelling of the abdomen that is new, acute  Fever of 100F or higher   For urgent or emergent issues, a gastroenterologist can be reached at any hour by calling [phone]. Do not use MyChart messaging for urgent concerns.    DIET:  We do recommend a small meal at first, but then you may proceed to your regular diet.  Drink plenty of fluids but you should avoid alcoholic beverages for 24 hours.  ACTIVITY:  You should plan to take it easy  for the rest of today and you should NOT DRIVE or use heavy machinery until tomorrow (because of the sedation medicines used during the test).    FOLLOW UP: Our staff will call the number listed on your records 24-72 hours following your procedure to check on you and address any questions or concerns that you may have regarding the information given to you following your procedure. If we do not reach you, we will leave a message.  We will attempt to reach you two times.  During this call, we will ask if you have developed any symptoms of COVID 19. If you develop any symptoms (ie: fever, flu-like symptoms, shortness of breath, cough etc.) before then, please call [phone].  If you test positive for Covid 19 in the 2 weeks post procedure, please call and report this information to us.    If any biopsies were taken you will be contacted by phone or by letter within the next 1-3 weeks.  Please call us at [phone] if you have not heard about the biopsies in 3 weeks.    SIGNATURES/CONFIDENTIALITY: You and/or your care partner have signed paperwork which will be entered into your electronic medical record.  These signatures attest to the fact that that the information above on your After Visit Summary has been reviewed and is understood.  Full responsibility of the confidentiality of this discharge information lies with you and/or your care-partner.  

## 2022-02-10 NOTE — Progress Notes (Signed)
Called to room to assist during endoscopic procedure.  Patient ID and intended procedure confirmed with present staff. Received instructions for my participation in the procedure from the performing physician.  

## 2022-02-10 NOTE — Progress Notes (Signed)
To pacu, VSS. Report to [email or web address] 

## 2022-02-10 NOTE — Op Note (Signed)
[place] Patient Name: Joseph Mcdaniel Procedure Date: 02/10/2022 2:54 PM MRN: [id number] Endoscopist: Remo Lipps P. Havery [name] , MD Age: 76 Referring MD:  Date of Birth: 27-Jul-1946 Gender: Male Account #: [id number] Procedure:                Colonoscopy Indications:              High risk colon cancer surveillance: Personal                            history of colonic polyps - 6 polyps removed                            02/2019, constipation - double prep used for this                            exam Medicines:                Monitored Anesthesia Care Procedure:                Pre-Anesthesia Assessment:                           - Prior to the procedure, a History and Physical                            was performed, and patient medications and                            allergies were reviewed. The patient's tolerance of                            previous anesthesia was also reviewed. The risks                            and benefits of the procedure and the sedation                            options and risks were discussed with the patient.                            All questions were answered, and informed consent                            was obtained. Prior Anticoagulants: The patient has                            taken no previous anticoagulant or antiplatelet                            agents. ASA Grade Assessment: II - A patient with                            mild systemic disease. After reviewing the risks  and benefits, the patient was deemed in                            satisfactory condition to undergo the procedure.                           After obtaining informed consent, the colonoscope                            was passed under direct vision. Throughout the                            procedure, the patient's blood pressure, pulse, and                            oxygen saturations were monitored continuously. The                             CF [id number] #[id number] was introduced through the anus                            and advanced to the the cecum, identified by                            appendiceal orifice and ileocecal valve. The                            colonoscopy was performed without difficulty. The                            patient tolerated the procedure well. The quality                            of the bowel preparation was good. The ileocecal                            valve, appendiceal orifice, and rectum were                            photographed. Scope In: 3:04:26 PM Scope Out: 3:22:55 PM Scope Withdrawal Time: 0 hours 15 minutes 20 seconds  Total Procedure Duration: 0 hours 18 minutes 29 seconds  Findings:                 The perianal and digital rectal examinations were                            normal.                           Two flat polyps were found in the cecum. The polyps                            were 3 mm in size. These polyps were removed with a  cold snare. Resection and retrieval were complete.                           A 3 to 4 mm polyp was found in the transverse                            colon. The polyp was sessile. The polyp was removed                            with a cold snare. Resection and retrieval were                            complete.                           There was a small lipoma, in the sigmoid colon.                           Internal hemorrhoids were found during                            retroflexion. The hemorrhoids were small.                           The exam was otherwise without abnormality. Complications:            No immediate complications. Estimated blood loss:                            Minimal. Estimated Blood Loss:     Estimated blood loss was minimal. Impression:               - Two 3 mm polyps in the cecum, removed with a cold                            snare. Resected and retrieved.                            - One 3 to 4 mm polyp in the transverse colon,                            removed with a cold snare. Resected and retrieved.                           - Small lipoma in the sigmoid colon.                           - Internal hemorrhoids.                           - The examination was otherwise normal. Recommendation:           - Patient has a contact number available for  emergencies. The signs and symptoms of potential                            delayed complications were discussed with the                            patient. Return to normal activities tomorrow.                            Written discharge instructions were provided to the                            patient.                           - Resume previous diet.                           - Continue present medications.                           - Await pathology results. Remo Lipps P. Havery [name], MD 02/10/2022 3:29:22 PM This report has been signed electronically.

## 2022-02-10 NOTE — Progress Notes (Signed)
[facility] History and Physical   Primary Care Physician:  Vivi Barrack, MD   Reason for Procedure:   History of colon polyps  Plan:    colonoscopy     HPI: Joseph Mcdaniel is a 76 y.o. male  here for colonoscopy surveillance - 6 polyps removed 02/2019. History of fair prep. Has constipation, double prep used for this exam.  Otherwise feels well without any cardiopulmonary symptoms. Have discussed risks / benefits and he wants to proceed.   Past Medical History:  Diagnosis Date   A-fib [facility]) with spontaneous convert to NSR 08/2012   Adenomatous colon polyp 09/2007   Anxiety    Cardiac CT on 5/11, calcium score 0    Cataracts, bilateral    DDD (degenerative disc disease)    ED (erectile dysfunction)    Fibromyalgia    Generalized OA    GERD (gastroesophageal reflux disease)    Myalgia and myositis    OSA (obstructive sleep apnea), compliant with CPAP    Sleep apnea    cpap   Vitamin D deficiency     Past Surgical History:  Procedure Laterality Date   CATARACT EXTRACTION Right 02/2019   CHONDROPLASTY Left    COLONOSCOPY     LIPOMA EXCISION     LUMBAR LAMINECTOMY     POLYPECTOMY     ROTATOR CUFF REPAIR Right    UPPER GASTROINTESTINAL ENDOSCOPY      Prior to Admission medications   Medication Sig Start Date End Date Taking? Authorizing Provider  aspirin 81 MG chewable tablet Chew 81 mg by mouth daily.    Yes [provider]  Ca Carbonate-Mag Hydroxide (ROLAIDS PO) Take by mouth as needed.   Yes [provider]  Cholecalciferol (VITAMIN D) 125 MCG (5000 UT) CAPS Take 1 capsule by mouth daily.   Yes [provider]  Coenzyme Q10 (COQ10) 100 MG CAPS Take by mouth.   Yes [provider]  ibuprofen (ADVIL) 800 MG tablet TAKE 1 TABLET BY MOUTH EVERY 8 HOURS AS NEEDED 09/04/21  Yes Vivi Barrack, MD  Multiple Vitamin (MULTIVITAMIN WITH MINERALS) TABS tablet Take 1 tablet by mouth daily.   Yes [provider]   omeprazole (PRILOSEC) 20 MG capsule Take 1 capsule (20 mg total) by mouth daily. 01/24/21  Yes Dashana Guizar, Carlota Raspberry, MD  tadalafil (CIALIS) 10 MG tablet Take 10 mg by mouth every other day. 10/17/21  Yes [provider]  Zinc 30 MG CAPS Take by mouth.   Yes [provider]  ipratropium (ATROVENT) 0.06 % nasal spray Place into both nostrils. 01/27/22   [provider]  mineral oil liquid Take 15 mLs by mouth at bedtime as needed for moderate constipation.    [provider]    Current Outpatient Medications  Medication Sig Dispense Refill   aspirin 81 MG chewable tablet Chew 81 mg by mouth daily.      Ca Carbonate-Mag Hydroxide (ROLAIDS PO) Take by mouth as needed.     Cholecalciferol (VITAMIN D) 125 MCG (5000 UT) CAPS Take 1 capsule by mouth daily.     Coenzyme Q10 (COQ10) 100 MG CAPS Take by mouth.     ibuprofen (ADVIL) 800 MG tablet TAKE 1 TABLET BY MOUTH EVERY 8 HOURS AS NEEDED 30 tablet 0   Multiple Vitamin (MULTIVITAMIN WITH MINERALS) TABS tablet Take 1 tablet by mouth daily.     omeprazole (PRILOSEC) 20 MG capsule Take 1 capsule (20 mg total) by mouth daily. [place]  capsule 3   tadalafil (CIALIS) 10 MG tablet Take 10 mg by mouth every other day.     Zinc 30 MG CAPS Take by mouth.     ipratropium (ATROVENT) 0.06 % nasal spray Place into both nostrils.     mineral oil liquid Take 15 mLs by mouth at bedtime as needed for moderate constipation.     Current Facility-Administered Medications  Medication Dose Route Frequency Provider Last Rate Last Admin   0.9 %  sodium chloride infusion  500 mL Intravenous Once Iban Utz, Carlota Raspberry, MD        Allergies as of 02/10/2022 - Review Complete 02/10/2022  Allergen Reaction Noted   Codeine Itching    Hydrocodone Itching    Omeprazole Itching 01/07/2022   Penicillins Hives and Itching     Family History  Problem Relation Age of Onset   Breast cancer Mother    Diabetes Maternal Aunt    Heart attack Father 39    Colon cancer Neg Hx    Colon polyps Neg Hx    Rectal cancer Neg Hx    Stomach cancer Neg Hx    Esophageal cancer Neg Hx     Social History   Socioeconomic History   Marital status: Married    Spouse name: Not on file   Number of children: 4   Years of education: Not on file   Highest education level: Not on file  Occupational History   Occupation: Retired    Comment: PT @ [employer]  Tobacco Use   Smoking status: Former    Packs/day: 1.50    Years: 30.00    Pack years: 45.00    Types: Cigarettes    Quit date: 09/08/1994    Years since quitting: 27.4   Smokeless tobacco: Never   Tobacco comments:    Quit smoking 1996, smoked for 30 years, pt smoke 3 packs per day for 1-2 years.   Vaping Use   Vaping Use: Never used  Substance and Sexual Activity   Alcohol use: Yes    Alcohol/week: 3.0 standard drinks    Types: 3 Cans of beer per week    Comment: occ beer on weekends   Drug use: No   Sexual activity: Not on file  Other Topics Concern   Not on file  Social History Narrative   Patient disabled due to multiple orthopedic problems.   Social Determinants of Health   Financial Resource Strain: Not on file  Food Insecurity: Not on file  Transportation Needs: Not on file  Physical Activity: Not on file  Stress: Not on file  Social Connections: Not on file  Intimate Partner Violence: Not on file    Review of Systems: All other review of systems negative except as mentioned in the HPI.  Physical Exam: Vital signs BP (!) 144/94   Pulse (!) 53   Temp 98.2 F (36.8 C) (Temporal)   Ht '5\' 8"'$  (1.727 m)   Wt 229 lb (103.9 kg)   SpO2 100%   BMI 34.82 kg/m   General:   Alert,  Well-developed, pleasant and cooperative in NAD Lungs:  Clear throughout to auscultation.   Heart:  Regular rate and rhythm Abdomen:  Soft, nontender and nondistended.   Neuro/Psych:  Alert and cooperative. Normal mood and affect. A and O x 3  Jolly Mango, MD [facility]  Gastroenterology

## 2022-02-11 ENCOUNTER — Telehealth: Payer: Self-pay

## 2022-02-11 NOTE — Telephone Encounter (Signed)
Attempted f/u call. No answer, left VM. 

## 2022-02-11 NOTE — Telephone Encounter (Signed)
  Follow up Call-     02/10/2022    2:25 PM 02/27/2021    9:17 AM  Call back number  Post procedure Call Back phone  # [phone]-wife-Patty [phone]  Permission to leave phone message Yes Yes     Patient questions:  Do you have a fever, pain , or abdominal swelling? No. Pain Score  0 *  Have you tolerated food without any problems? Yes.    Have you been able to return to your normal activities? Yes.    Do you have any questions about your discharge instructions: Diet   No. Medications  No. Follow up visit  No.  Do you have questions or concerns about your Care? No.  Actions: * If pain score is 4 or above: No action needed, pain <4.

## 2022-03-06 DIAGNOSIS — G4733 Obstructive sleep apnea (adult) (pediatric): Secondary | ICD-10-CM | POA: Diagnosis not present

## 2022-04-16 ENCOUNTER — Encounter: Payer: Self-pay | Admitting: Family

## 2022-04-16 ENCOUNTER — Ambulatory Visit (INDEPENDENT_AMBULATORY_CARE_PROVIDER_SITE_OTHER): Payer: Medicare Other | Admitting: Family

## 2022-04-16 VITALS — BP 130/76 | HR 59 | Temp 98.5°F | Ht 68.0 in | Wt 218.0 lb

## 2022-04-16 DIAGNOSIS — K1120 Sialoadenitis, unspecified: Secondary | ICD-10-CM

## 2022-04-16 MED ORDER — IBUPROFEN 800 MG PO TABS: 800.0000 mg | ORAL_TABLET | Freq: Three times a day (TID) | ORAL | 0 refills | Status: DC | PRN

## 2022-04-16 MED ORDER — CEPHALEXIN 500 MG PO CAPS: 500.0000 mg | ORAL_CAPSULE | Freq: Four times a day (QID) | ORAL | 0 refills | Status: AC

## 2022-04-16 NOTE — Patient Instructions (Signed)
It was very nice to see you today!   I have sent over an antibiotic to treat your swollen salivary/parotid gland.  Try to increase the saliva in your mouth to reduce symptoms from a possible salivary stone:  suck on candy, spray your mouth with OTC Biotene spray or suck on Biotene lozenges, suck on ice, etc. And drink at least 2 liters of water daily.  I also refilled RX strength Ibuprofen to take for pain.  Call the office if your symptoms are not improving.     PLEASE NOTE:  If you had any lab tests please let us know if you have not heard back within a few days. You may see your results on MyChart before we have a chance to review them but we will give you a call once they are reviewed by [place]. If we ordered any referrals today, please let us know if you have not heard from their office within the next week.

## 2022-04-16 NOTE — Progress Notes (Signed)
Patient ID: Joseph Mcdaniel, male    DOB: 09-Nov-1945, 76 y.o.   MRN: [id number]  Chief Complaint  Patient presents with   Ear Pain    Pt c/o a swollen gland in front of left ear and swollen left jaw, wife had the same symptoms. Pt states his mouth on that side is very sore. Woke up at 7am and about a hour later his left side of face started swelling and getting worse.     HPI: Left side of face pain:  reports foul taste in his mouth last week, states his wife has had a salivary gland infection for a week, today he woke up with swelling and pain on left side of his face from the opening of his ear down to his chin. Reports pain with swallowing and chewing. No fever, no cheek or gum tenderness. Pt reports sucking on sour lozenges given to his wife and it caused a lot of pain on inside of mouth.  Assessment & Plan:  1. Salivary gland infection  - with possible parotid gland involvement and/or salivary stone, gums/glands non-tender to palpation, but due to sx most likely correct dx.  Allergy to PCN, hives, sending Keflex, advised on use & SE, pt does not remember taking a Cephalosporin in past, advised if any reaction, stop antibiotic and call the office. Advised on keeping mouth moist, sucking on candy, ice chips, or using OTC Biotene spray or lozenges.   - ibuprofen (ADVIL) 800 MG tablet; Take 1 tablet (800 mg total) by mouth every 8 (eight) hours as needed for moderate pain. Take after eating.  Dispense: 30 tablet; Refill: 0 - cephALEXin (KEFLEX) 500 MG capsule; Take 1 capsule (500 mg total) by mouth 4 (four) times daily for 7 days.  Dispense: 28 capsule; Refill: 0  Subjective:    Outpatient Medications Prior to Visit  Medication Sig Dispense Refill   aspirin 81 MG chewable tablet Chew 81 mg by mouth daily.      Ca Carbonate-Mag Hydroxide (ROLAIDS PO) Take by mouth as needed.     Cholecalciferol (VITAMIN D) 125 MCG (5000 UT) CAPS Take 1 capsule by mouth daily.     Coenzyme Q10 (COQ10) 100  MG CAPS Take by mouth.     ibuprofen (ADVIL) 800 MG tablet TAKE 1 TABLET BY MOUTH EVERY 8 HOURS AS NEEDED 30 tablet 0   ipratropium (ATROVENT) 0.06 % nasal spray Place into both nostrils as needed.     mineral oil liquid Take 15 mLs by mouth at bedtime as needed for moderate constipation.     Multiple Vitamin (MULTIVITAMIN WITH MINERALS) TABS tablet Take 1 tablet by mouth daily.     omeprazole (PRILOSEC) 20 MG capsule Take 1 capsule (20 mg total) by mouth daily. 90 capsule 3   tadalafil (CIALIS) 10 MG tablet Take 10 mg by mouth every other day.     Zinc 30 MG CAPS Take by mouth.     No facility-administered medications prior to visit.   Past Medical History:  Diagnosis Date   A-fib [facility]) with spontaneous convert to NSR 08/2012   Adenomatous colon polyp 09/2007   Anxiety    Cardiac CT on 5/11, calcium score 0    Cataracts, bilateral    DDD (degenerative disc disease)    ED (erectile dysfunction)    Fibromyalgia    Generalized OA    GERD (gastroesophageal reflux disease)    Myalgia and myositis    OSA (obstructive sleep apnea), compliant with CPAP  Sleep apnea    cpap   Vitamin D deficiency    Past Surgical History:  Procedure Laterality Date   CATARACT EXTRACTION Right 02/2019   CHONDROPLASTY Left    COLONOSCOPY     LIPOMA EXCISION     LUMBAR LAMINECTOMY     POLYPECTOMY     ROTATOR CUFF REPAIR Right    UPPER GASTROINTESTINAL ENDOSCOPY     Allergies  Allergen Reactions   Codeine Itching   Hydrocodone Itching   Omeprazole Itching    Reaction unknown Reaction unknown   Penicillins Hives and Itching    Has patient had a PCN reaction causing immediate rash, facial/tongue/throat swelling, SOB or lightheadedness with hypotension: Yes Has patient had a PCN reaction causing severe rash involving mucus membranes or skin necrosis: No Has patient had a PCN reaction that required hospitalization: No Has patient had a PCN reaction occurring within the last 10 years: No If all of  the above answers are "NO", then may proceed with Cephalosporin use.       Objective:    Physical Exam Vitals and nursing note reviewed.  Constitutional:      General: He is not in acute distress.    Appearance: Normal appearance.  HENT:     Head: Normocephalic.     Jaw: Tenderness (left side), swelling (left side, no erythema or warmth) and pain on movement (left side, pain w/chewing) present.     Salivary Glands: Diffusely enlarged: non-tender. Left salivary gland is not diffusely enlarged (non-tender).      Mouth/Throat:     Mouth: No injury or oral lesions.     Dentition: Normal dentition. No dental tenderness, dental abscesses or gum lesions.     Tongue: No lesions.     Palate: No mass and lesions.     Pharynx: No pharyngeal swelling, oropharyngeal exudate or posterior oropharyngeal erythema.  Cardiovascular:     Rate and Rhythm: Normal rate and regular rhythm.  Pulmonary:     Effort: Pulmonary effort is normal.     Breath sounds: Normal breath sounds.  Musculoskeletal:        General: Normal range of motion.     Cervical back: Normal range of motion.  Skin:    General: Skin is warm and dry.  Neurological:     Mental Status: He is alert and oriented to person, place, and time.  Psychiatric:        Mood and Affect: Mood normal.    BP 130/76 (BP Location: Left Arm, Patient Position: Sitting, Cuff Size: Large)   Pulse (!) 59   Temp 98.5 F (36.9 C) (Temporal)   Ht '5\' 8"'$  (1.727 m)   Wt 218 lb (98.9 kg)   SpO2 99%   BMI 33.15 kg/m  Wt Readings from Last 3 Encounters:  04/16/22 218 lb (98.9 kg)  02/10/22 229 lb (103.9 kg)  01/03/22 229 lb (103.9 kg)       Jeanie Sewer, NP

## 2022-05-18 NOTE — Progress Notes (Unsigned)
Cardiology Office Note   Date:  05/19/2022   ID:  Joseph Mcdaniel, DOB [birth year], MRN [id number]  PCP:  Vivi Barrack, MD  Cardiologist:   Minus Breeding, MD Referring:  Vivi Barrack, MD  Chief Complaint  Patient presents with   Atrial Fibrillation      History of Present Illness: Joseph Mcdaniel is a 76 y.o. male who presents for follow up of atrial fibrillation and in 2013 spontaneously converted to normal sinus rhythm.  CHA2DS2-VASc score was 0 at the time and there was no need for anticoagulation.  Cardiac CT in 2011 normal coronary arteries and calcium score of 0.  Negative Lexiscan Myoview for ischemia for chest pain 07/2017.  He had a reduced EF on echo in 2018 with an EF of 40 -45%.     We saw him earlier this year for PAF.  I sent him for a monitor which confirmed this.  He was to be started on a DOAC.   However, this follow-up appointment somehow was rescheduled.  He is here now to talk about it.  I reviewed his monitor with him and indeed he did have atrial fibrillation sustained for about 5 hours 1 day during the 14-day monitor.  He is wearing an Visual merchandiser and said he has had no alerts.  He is not having any palpitations, presyncope or syncope.  He did not feel it when he was wearing a monitor.  He stays active in his garden.  He denies any cardiovascular symptoms.   Past Medical History:  Diagnosis Date   A-fib [facility]) with spontaneous convert to NSR 08/2012   Adenomatous colon polyp 09/2007   Anxiety    Cardiac CT on 5/11, calcium score 0    Cataracts, bilateral    DDD (degenerative disc disease)    ED (erectile dysfunction)    Fibromyalgia    Generalized OA    GERD (gastroesophageal reflux disease)    Myalgia and myositis    OSA (obstructive sleep apnea), compliant with CPAP    Sleep apnea    cpap   Vitamin D deficiency     Past Surgical History:  Procedure Laterality Date   CATARACT EXTRACTION Right 02/2019   CHONDROPLASTY Left     COLONOSCOPY     LIPOMA EXCISION     LUMBAR LAMINECTOMY     POLYPECTOMY     ROTATOR CUFF REPAIR Right    UPPER GASTROINTESTINAL ENDOSCOPY       Current Outpatient Medications  Medication Sig Dispense Refill   apixaban (ELIQUIS) 5 MG TABS tablet Take 1 tablet (5 mg total) by mouth 2 (two) times daily. 180 tablet 3   Ca Carbonate-Mag Hydroxide (ROLAIDS PO) Take by mouth as needed.     Cholecalciferol (VITAMIN D) 125 MCG (5000 UT) CAPS Take 1 capsule by mouth daily.     Coenzyme Q10 (COQ10) 100 MG CAPS Take by mouth.     ibuprofen (ADVIL) 800 MG tablet Take 1 tablet (800 mg total) by mouth every 8 (eight) hours as needed for moderate pain. Take after eating. 30 tablet 0   ipratropium (ATROVENT) 0.06 % nasal spray Place into both nostrils as needed.     mineral oil liquid Take 15 mLs by mouth at bedtime as needed for moderate constipation.     Multiple Vitamin (MULTIVITAMIN WITH MINERALS) TABS tablet Take 1 tablet by mouth daily.     omeprazole (PRILOSEC) 20 MG capsule Take 1 capsule (20 mg total)  by mouth daily. 90 capsule 3   tadalafil (CIALIS) 10 MG tablet Take 10 mg by mouth every other day.     Zinc 30 MG CAPS Take by mouth.     No current facility-administered medications for this visit.    Allergies:   Codeine, Hydrocodone, Omeprazole, and Penicillins    ROS:  Please see the history of present illness.   Otherwise, review of systems are positive for none.   All other systems are reviewed and negative.    PHYSICAL EXAM: VS:  BP 130/72   Pulse (!) 51   Ht '5\' 9"'$  (1.753 m)   Wt 219 lb 12.8 oz (99.7 kg)   SpO2 99%   BMI 32.46 kg/m  , BMI Body mass index is 32.46 kg/m. GENERAL:  Well appearing NECK:  No jugular venous distention, waveform within normal limits, carotid upstroke brisk and symmetric, no bruits, no thyromegaly LUNGS:  Clear to auscultation bilaterally CHEST:  Unremarkable HEART:  PMI not displaced or sustained,S1 and S2 within normal limits, no S3, no S4, no  clicks, no rubs, no murmurs ABD:  Flat, positive bowel sounds normal in frequency in pitch, no bruits, no rebound, no guarding, no midline pulsatile mass, no hepatomegaly, no splenomegaly EXT:  2 plus pulses throughout, no edema, no cyanosis no clubbing    EKG:  EKG is  ordered today. The ekg ordered today demonstrates normal sinus rhythm, rate 51, leftward axis, early transition lead V2, no acute ST-T wave changes.   Recent Labs: No results found for requested labs within last 365 days.    Lipid Panel    Component Value Date/Time   CHOL 161 02/21/2019 1516   TRIG 208.0 (H) 02/21/2019 1516   HDL 35.20 (L) 02/21/2019 1516   CHOLHDL 5 02/21/2019 1516   VLDL 41.6 (H) 02/21/2019 1516   LDLCALC 103 (H) 12/29/2017 1527   LDLDIRECT 107.0 02/21/2019 1516      Wt Readings from Last 3 Encounters:  05/19/22 219 lb 12.8 oz (99.7 kg)  04/16/22 218 lb (98.9 kg)  02/10/22 229 lb (103.9 kg)      Echo   1. Left ventricular ejection fraction, by estimation, is 55 to 60%. Left  ventricular ejection fraction by 3D volume is 58 %. The left ventricle has  normal function. The left ventricle has no regional wall motion  abnormalities. Left ventricular diastolic   parameters were normal.   2. Right ventricular systolic function is normal. The right ventricular  size is normal. There is normal pulmonary artery systolic pressure. The  estimated right ventricular systolic pressure is 16.1 mmHg.   3. The mitral valve is normal in structure. Trivial mitral valve  regurgitation. No evidence of mitral stenosis.   4. The aortic valve is tricuspid. Aortic valve regurgitation is not  visualized. No aortic stenosis is present.   5. The inferior vena cava is normal in size with greater than 50%  respiratory variability, suggesting right atrial pressure of 3 mmHg.     Other studies Reviewed: Additional studies/ records that were reviewed today include: None. Review of the above records  demonstrates:  NA   ASSESSMENT AND PLAN:  PAF: He wore a monitor and had 5 hour runs of atrial fib.   I am going to go ahead and start him on Eliquis 5 mg twice daily.   He is going to keep an eye with his wearable to see if we can detect in the next 3 months whether he is actually having  any further A-fib.  If not we may discontinue the medication.  I will check a CBC and a basic metabolic profile today.  HTN: His blood pressure is at target.  No change in therapy.   Sleep apnea:  He wears CPAP.   Dilated cardiomyopathy:    An echocardiogram in March demonstrated an EF of 55%.  Continue meds as listed.  Current medicines are reviewed at length with the patient today.  The patient does not have concerns regarding medicines.  The following changes have been made:    Labs/ tests ordered today include:     Orders Placed This Encounter  Procedures   Basic metabolic panel   CBC   EKG 12-Lead     Disposition:   FU with me in 3 months.     Signed, Minus Breeding, MD  05/19/2022 10:29 AM    [facility]

## 2022-05-19 ENCOUNTER — Ambulatory Visit: Payer: Medicare Other | Attending: Cardiology | Admitting: Cardiology

## 2022-05-19 ENCOUNTER — Encounter: Payer: Self-pay | Admitting: Cardiology

## 2022-05-19 VITALS — BP 130/72 | HR 51 | Ht 69.0 in | Wt 219.8 lb

## 2022-05-19 DIAGNOSIS — I10 Essential (primary) hypertension: Secondary | ICD-10-CM | POA: Diagnosis not present

## 2022-05-19 DIAGNOSIS — I480 Paroxysmal atrial fibrillation: Secondary | ICD-10-CM | POA: Diagnosis not present

## 2022-05-19 MED ORDER — APIXABAN 5 MG PO TABS: 5.0000 mg | ORAL_TABLET | Freq: Two times a day (BID) | ORAL | 3 refills | Status: DC

## 2022-05-19 NOTE — Patient Instructions (Addendum)
Medication Instructions:  STOP aspirin  START Eliquis 5 mg two times daily  *If you need a refill on your cardiac medications before your next appointment, please call your pharmacy*   Lab Work: BMET, CBC today  If you have labs (blood work) drawn today and your tests are completely normal, you will receive your results only by: [place] (if you have MyChart) OR A paper copy in the mail If you have any lab test that is abnormal or we need to change your treatment, we will call you to review the results.  Follow-Up: At [facility], you and your health needs are our priority.  As part of our continuing mission to provide you with exceptional heart care, we have created designated Provider Care Teams.  These Care Teams include your primary Cardiologist (physician) and Advanced Practice Providers (APPs -  Physician Assistants and Nurse Practitioners) who all work together to provide you with the care you need, when you need it.  We recommend signing up for the patient portal called "MyChart".  Sign up information is provided on this After Visit Summary.  MyChart is used to connect with patients for Virtual Visits (Telemedicine).  Patients are able to view lab/test results, encounter notes, upcoming appointments, etc.  Non-urgent messages can be sent to your provider as well.   To learn more about what you can do with MyChart, go to [email or web address].    Your next appointment:   3 month(s)  The format for your next appointment:   In Person  Provider:   Minus Breeding, MD

## 2022-05-20 LAB — CBC
Hematocrit: 48.5 % (ref 37.5–51.0)
Hemoglobin: 15.8 g/dL (ref 13.0–17.7)
MCH: 29.4 pg (ref 26.6–33.0)
MCHC: 32.6 g/dL (ref 31.5–35.7)
MCV: 90 fL (ref 79–97)
Platelets: 192 10*3/uL (ref 150–450)
RBC: 5.38 x10E6/uL (ref 4.14–5.80)
RDW: 13.2 % (ref 11.6–15.4)
WBC: 7.2 10*3/uL (ref 3.4–10.8)

## 2022-05-20 LAB — BASIC METABOLIC PANEL
BUN/Creatinine Ratio: 18 (ref 10–24)
BUN: 17 mg/dL (ref 8–27)
CO2: 25 mmol/L (ref 20–29)
Calcium: 8.6 mg/dL (ref 8.6–10.2)
Chloride: 103 mmol/L (ref 96–106)
Creatinine, Ser: 0.97 mg/dL (ref 0.76–1.27)
Glucose: 93 mg/dL (ref 70–99)
Potassium: 4.6 mmol/L (ref 3.5–5.2)
Sodium: 141 mmol/L (ref 134–144)
eGFR: 81 mL/min/{1.73_m2} (ref >59)

## 2022-05-26 ENCOUNTER — Encounter: Payer: Self-pay | Admitting: *Deleted

## 2022-06-02 ENCOUNTER — Encounter: Payer: Self-pay | Admitting: *Deleted

## 2022-08-02 DIAGNOSIS — G4733 Obstructive sleep apnea (adult) (pediatric): Secondary | ICD-10-CM | POA: Diagnosis not present

## 2022-08-21 ENCOUNTER — Encounter: Payer: Self-pay | Admitting: *Deleted

## 2022-08-25 ENCOUNTER — Ambulatory Visit: Payer: Medicare Other | Admitting: Cardiology

## 2022-09-02 ENCOUNTER — Ambulatory Visit (INDEPENDENT_AMBULATORY_CARE_PROVIDER_SITE_OTHER): Payer: Medicare Other

## 2022-09-02 VITALS — Wt 219.0 lb

## 2022-09-02 DIAGNOSIS — Z0000 Encounter for general adult medical examination without abnormal findings: Secondary | ICD-10-CM

## 2022-09-02 NOTE — Progress Notes (Addendum)
I connected with  Maxie Barb on 09/02/22 by a audio enabled telemedicine application and verified that I am speaking with the correct person using two identifiers.  Patient Location: Home  Provider Location: Office/Clinic  I discussed the limitations of evaluation and management by telemedicine. The patient expressed understanding and agreed to proceed.   Subjective:   Joseph Mcdaniel is a 76 y.o. male who presents for Medicare Annual/Subsequent preventive examination.  Review of Systems     Cardiac Risk Factors include: advanced age (>5mn, >>57women);hypertension;male gender;obesity (BMI >30kg/m2)     Objective:    Today's Vitals   09/02/22 0800  Weight: 219 lb (99.3 kg)   Body mass index is 32.34 kg/m.     09/02/2022    8:04 AM 12/22/2018    1:09 PM 09/10/2017    6:45 PM 01/04/2016    2:09 PM 08/31/2012    1:49 AM  Advanced Directives  Does Patient Have a Medical Advance Directive? Yes Yes No No Patient does not have advance directive;Patient would not like information  Type of AScientist, forensicPower of [place] will      Does patient want to make changes to medical advance directive?  No - Patient declined     Copy of [place]? No - copy requested        Current Medications (verified) Outpatient Encounter Medications as of 09/02/2022  Medication Sig   apixaban (ELIQUIS) 5 MG TABS tablet Take 1 tablet (5 mg total) by mouth 2 (two) times daily.   Ca Carbonate-Mag Hydroxide (ROLAIDS PO) Take by mouth as needed.   Cholecalciferol (VITAMIN D) 125 MCG (5000 UT) CAPS Take 1 capsule by mouth daily.   Coenzyme Q10 (COQ10) 100 MG CAPS Take by mouth.   ibuprofen (ADVIL) 800 MG tablet Take 1 tablet (800 mg total) by mouth every 8 (eight) hours as needed for moderate pain. Take after eating.   ipratropium (ATROVENT) 0.06 % nasal spray Place into both nostrils as needed.   mineral oil liquid Take 15 mLs by mouth at bedtime  as needed for moderate constipation.   Multiple Vitamin (MULTIVITAMIN WITH MINERALS) TABS tablet Take 1 tablet by mouth daily.   tadalafil (CIALIS) 10 MG tablet Take 10 mg by mouth every other day.   Zinc 30 MG CAPS Take by mouth.   [DISCONTINUED] omeprazole (PRILOSEC) 20 MG capsule Take 1 capsule (20 mg total) by mouth daily.   No facility-administered encounter medications on file as of 09/02/2022.    Allergies (verified) Codeine, Hydrocodone, Omeprazole, and Penicillins   History: Past Medical History:  Diagnosis Date   A-fib ([facility] with spontaneous convert to NSR 08/2012   Adenomatous colon polyp 09/2007   Anxiety    Cardiac CT on 5/11, calcium score 0    Cataracts, bilateral    DDD (degenerative disc disease)    ED (erectile dysfunction)    Fibromyalgia    Generalized OA    GERD (gastroesophageal reflux disease)    Myalgia and myositis    OSA (obstructive sleep apnea), compliant with CPAP    Sleep apnea    cpap   Vitamin D deficiency    Past Surgical History:  Procedure Laterality Date   CATARACT EXTRACTION Right 02/2019   CHONDROPLASTY Left    COLONOSCOPY     LIPOMA EXCISION     LUMBAR LAMINECTOMY     POLYPECTOMY     ROTATOR CUFF REPAIR Right    UPPER GASTROINTESTINAL ENDOSCOPY  Family History  Problem Relation Age of Onset   Breast cancer Mother    Diabetes Maternal Aunt    Heart attack Father 58   Colon cancer Neg Hx    Colon polyps Neg Hx    Rectal cancer Neg Hx    Stomach cancer Neg Hx    Esophageal cancer Neg Hx    Social History   Socioeconomic History   Marital status: Married    Spouse name: Not on file   Number of children: 4   Years of education: Not on file   Highest education level: Not on file  Occupational History   Occupation: Retired    Comment: PT @ [employer]  Tobacco Use   Smoking status: Former    Packs/day: 1.50    Years: 30.00    Total pack years: 45.00    Types: Cigarettes    Quit date: 09/08/1994    Years since quitting:  28.0   Smokeless tobacco: Never   Tobacco comments:    Quit smoking 1996, smoked for 30 years, pt smoke 3 packs per day for 1-2 years.   Vaping Use   Vaping Use: Never used  Substance and Sexual Activity   Alcohol use: Yes    Alcohol/week: 3.0 standard drinks of alcohol    Types: 3 Cans of beer per week    Comment: occ beer on weekends   Drug use: No   Sexual activity: Not on file  Other Topics Concern   Not on file  Social History Narrative   Patient disabled due to multiple orthopedic problems.   Social Determinants of Health   Financial Resource Strain: Low Risk  (09/02/2022)   Overall Financial Resource Strain (CARDIA)    Difficulty of Paying Living Expenses: Not hard at all  Food Insecurity: No Food Insecurity (09/02/2022)   Hunger Vital Sign    Worried About Running Out of Food in the Last Year: Never true    Ran Out of Food in the Last Year: Never true  Transportation Needs: No Transportation Needs (09/02/2022)   PRAPARE - Hydrologist (Medical): No    Lack of Transportation (Non-Medical): No  Physical Activity: Inactive (09/02/2022)   Exercise Vital Sign    Days of Exercise per Week: 0 days    Minutes of Exercise per Session: 0 min  Stress: No Stress Concern Present (09/02/2022)   [place]    Feeling of Stress : Not at all  Social Connections: [place] (09/02/2022)   Social Connection and Isolation Panel [NHANES]    Frequency of Communication with Friends and Family: More than three times a week    Frequency of Social Gatherings with Friends and Family: More than three times a week    Attends Religious Services: More than 4 times per year    Active Member of [employer] or Organizations: Yes    Attends Archivist Meetings: 1 to 4 times per year    Marital Status: Married    Tobacco Counseling Counseling given: Not Answered Tobacco comments: Quit  smoking 1996, smoked for 30 years, pt smoke 3 packs per day for 1-2 years.    Clinical Intake:  Pre-visit preparation completed: Yes  Pain : No/denies pain     BMI - recorded: 32.34 Nutritional Status: BMI > 30  Obese Nutritional Risks: None Diabetes: No  How often do you need to have someone help you when you read instructions, pamphlets, or  other written materials from your doctor or pharmacy?: 1 - Never  Diabetic?no  Interpreter Needed?: No  Information entered by :: Charlott Rakes, LPN   Activities of Daily Living    09/02/2022    8:05 AM  In your present state of health, do you have any difficulty performing the following activities:  Hearing? 1  Vision? 0  Difficulty concentrating or making decisions? 1  Comment at times  Walking or climbing stairs? 1  Comment at times  Dressing or bathing? 0  Doing errands, shopping? 0  Preparing Food and eating ? N  Using the Toilet? N  In the past six months, have you accidently leaked urine? N  Do you have problems with loss of bowel control? N  Managing your Medications? N  Managing your Finances? N  Housekeeping or managing your Housekeeping? N    Patient Care Team: Vivi Barrack, MD as PCP - General (Family Medicine) Minus Breeding, MD as PCP - Cardiology (Cardiology) Sydnee Cabal, MD as Consulting Physician (Orthopedic Surgery)  Indicate any recent Medical Services you may have received from other than [facility] providers in the past year (date may be approximate).     Assessment:   This is a routine wellness examination for Joseph Mcdaniel.  Hearing/Vision screen Hearing Screening - Comments:: Pt stated HOH  Vision Screening - Comments:: Pt follows up with Dr Katy Fitch for annual eye exams   Dietary issues and exercise activities discussed: Current Exercise Habits: The patient does not participate in regular exercise at present   Goals Addressed             This Visit's Progress    Patient Stated       Lose  weight        Depression Screen    09/02/2022    8:02 AM 08/15/2021    9:55 AM 05/21/2021    1:40 PM 10/12/2020    8:54 AM 10/11/2019    8:45 AM 07/11/2019    9:47 AM 12/22/2018    1:09 PM  PHQ 2/9 Scores  PHQ - 2 Score 0 0 0 0 0 0 0    Fall Risk    09/02/2022    8:04 AM 08/15/2021    9:55 AM 10/12/2020    8:54 AM 10/11/2019    8:45 AM 07/11/2019    9:47 AM  Fall Risk   Falls in the past year? 1 0 0 0 0  Number falls in past yr: 1 0     Injury with Fall? 0 0     Risk for fall due to : Impaired vision;Impaired balance/gait;Impaired mobility;History of fall(s) History of fall(s);No Fall Risks     Follow up Falls prevention discussed        FALL RISK PREVENTION PERTAINING TO THE HOME:  Any stairs in or around the home? Yes  If so, are there any without handrails? No  Home free of loose throw rugs in walkways, pet beds, electrical cords, etc? Yes  Adequate lighting in your home to reduce risk of falls? Yes   ASSISTIVE DEVICES UTILIZED TO PREVENT FALLS:  Life alert? No  Use of a cane, walker or w/c? Yes  Grab bars in the bathroom? No  Shower chair or bench in shower? Yes  Elevated toilet seat or a handicapped toilet? No   TIMED UP AND GO:  Was the test performed? No .   Cognitive Function:        09/02/2022    8:06 AM  6CIT  Screen  What Year? 0 points  What month? 0 points  What time? 0 points  Count back from 20 0 points  Months in reverse 0 points  Repeat phrase 0 points  Total Score 0 points    Immunizations Immunization History  Administered Date(s) Administered   Fluad Quad(high Dose 65+) 07/11/2019   Influenza Split 05/09/2014   Influenza, High Dose Seasonal PF 07/01/2018   Pneumococcal-Unspecified 05/09/2014   Tdap 11/05/2017   Zoster Recombinat (Shingrix) 01/19/2019, 10/11/2019    TDAP status: Up to date  Flu Vaccine status: Declined, Education has been provided regarding the importance of this vaccine but patient still declined. Advised may  receive this vaccine at local pharmacy or Health Dept. Aware to provide a copy of the vaccination record if obtained from local pharmacy or Health Dept. Verbalized acceptance and understanding.  Pneumococcal vaccine status: Due, Education has been provided regarding the importance of this vaccine. Advised may receive this vaccine at local pharmacy or Health Dept. Aware to provide a copy of the vaccination record if obtained from local pharmacy or Health Dept. Verbalized acceptance and understanding.  Covid-19 vaccine status: Declined, Education has been provided regarding the importance of this vaccine but patient still declined. Advised may receive this vaccine at local pharmacy or Health Dept.or vaccine clinic. Aware to provide a copy of the vaccination record if obtained from local pharmacy or Health Dept. Verbalized acceptance and understanding.  Qualifies for Shingles Vaccine? Yes   Zostavax completed Yes   Shingrix Completed?: Yes  Screening Tests Health Maintenance  Topic Date Due   Hepatitis C Screening  Never done   Pneumonia Vaccine 94+ Years old (1 - PCV) 10/09/2011   INFLUENZA VACCINE  12/07/2022 (Originally 04/08/2022)   Medicare Annual Wellness (AWV)  09/03/2023   DTaP/Tdap/Td (2 - Td or Tdap) 11/06/2027   COLONOSCOPY (Pts 45-86yr Insurance coverage will need to be confirmed)  02/11/2032   Zoster Vaccines- Shingrix  Completed   HPV VACCINES  Aged Out   COVID-19 Vaccine  Discontinued    Health Maintenance  Health Maintenance Due  Topic Date Due   Hepatitis C Screening  Never done   Pneumonia Vaccine 75 Years old (1 - PCV) 10/09/2011    Colorectal cancer screening: Type of screening: Colonoscopy. Completed 02/10/22. Repeat every 10 years   Additional Screening:  Hepatitis C Screening: does qualify;   Vision Screening: Recommended annual ophthalmology exams for early detection of glaucoma and other disorders of the eye. Is the patient up to date with their annual eye  exam?  Yes  Who is the provider or what is the name of the office in which the patient attends annual eye exams? Dr GKaty Fitch If pt is not established with a provider, would they like to be referred to a provider to establish care? No .   Dental Screening: Recommended annual dental exams for proper oral hygiene  Community Resource Referral / Chronic Care Management: CRR required this visit?  No   CCM required this visit?  No      Plan:     I have personally reviewed and noted the following in the patient's chart:   Medical and social history Use of alcohol, tobacco or illicit drugs  Current medications and supplements including opioid prescriptions. Patient is not currently taking opioid prescriptions. Functional ability and status Nutritional status Physical activity Advanced directives List of other physicians Hospitalizations, surgeries, and ER visits in previous 12 months Vitals Screenings to include cognitive, depression, and falls Referrals  and appointments  In addition, I have reviewed and discussed with patient certain preventive protocols, quality metrics, and best practice recommendations. A written personalized care plan for preventive services as well as general preventive health recommendations were provided to patient.     Willette Brace, LPN   80/22/1798   Nurse Notes: none

## 2022-09-02 NOTE — Addendum Note (Signed)
Addended by: Willette Brace on: 09/02/2022 08:13 AM   Modules accepted: Level of Service

## 2022-09-02 NOTE — Patient Instructions (Signed)
Joseph Mcdaniel , Thank you for taking time to come for your Medicare Wellness Visit. I appreciate your ongoing commitment to your health goals. Please review the following plan we discussed and let me know if I can assist you in the future.   These are the goals we discussed:  Goals      DIET - EAT MORE FRUITS AND VEGETABLES     DIET - INCREASE WATER INTAKE     Exercise 3x per week (30 min per time)     Patient Stated     Lose weight         This is a list of the screening recommended for you and due dates:  Health Maintenance  Topic Date Due   Hepatitis C Screening: USPSTF Recommendation to screen - Ages 74-79 yo.  Never done   Pneumonia Vaccine (1 - PCV) 10/09/2011   Flu Shot  12/07/2022*   Medicare Annual Wellness Visit  09/03/2023   DTaP/Tdap/Td vaccine (2 - Td or Tdap) 11/06/2027   Colon Cancer Screening  02/11/2032   Zoster (Shingles) Vaccine  Completed   HPV Vaccine  Aged Out   COVID-19 Vaccine  Discontinued  *Topic was postponed. The date shown is not the original due date.    Advanced directives: Please bring a copy of your health care power of attorney and living will to the office at your convenience.  Conditions/risks identified: lose some weight   Next appointment: Follow up in one year for your annual wellness visit.   Preventive Care 36 Years and Older, Male  Preventive care refers to lifestyle choices and visits with your health care provider that can promote health and wellness. What does preventive care include? A yearly physical exam. This is also called an annual well check. Dental exams once or twice a year. Routine eye exams. Ask your health care provider how often you should have your eyes checked. Personal lifestyle choices, including: Daily care of your teeth and gums. Regular physical activity. Eating a healthy diet. Avoiding tobacco and drug use. Limiting alcohol use. Practicing safe sex. Taking low doses of aspirin every day. Taking  vitamin and mineral supplements as recommended by your health care provider. What happens during an annual well check? The services and screenings done by your health care provider during your annual well check will depend on your age, overall health, lifestyle risk factors, and family history of disease. Counseling  Your health care provider may ask you questions about your: Alcohol use. Tobacco use. Drug use. Emotional well-being. Home and relationship well-being. Sexual activity. Eating habits. History of falls. Memory and ability to understand (cognition). Work and work Statistician. Screening  You may have the following tests or measurements: Height, weight, and BMI. Blood pressure. Lipid and cholesterol levels. These may be checked every 5 years, or more frequently if you are over 9 years old. Skin check. Lung cancer screening. You may have this screening every year starting at age 69 if you have a 30-pack-year history of smoking and currently smoke or have quit within the past 15 years. Fecal occult blood test (FOBT) of the stool. You may have this test every year starting at age 72. Flexible sigmoidoscopy or colonoscopy. You may have a sigmoidoscopy every 5 years or a colonoscopy every 10 years starting at age 59. Prostate cancer screening. Recommendations will vary depending on your family history and other risks. Hepatitis C blood test. Hepatitis B blood test. Sexually transmitted disease (STD) testing. Diabetes screening. This is done  by checking your blood sugar (glucose) after you have not eaten for a while (fasting). You may have this done every 1-3 years. Abdominal aortic aneurysm (AAA) screening. You may need this if you are a current or former smoker. Osteoporosis. You may be screened starting at age 62 if you are at high risk. Talk with your health care provider about your test results, treatment options, and if necessary, the need for more tests. Vaccines  Your  health care provider may recommend certain vaccines, such as: Influenza vaccine. This is recommended every year. Tetanus, diphtheria, and acellular pertussis (Tdap, Td) vaccine. You may need a Td booster every 10 years. Zoster vaccine. You may need this after age 40. Pneumococcal 13-valent conjugate (PCV13) vaccine. One dose is recommended after age 12. Pneumococcal polysaccharide (PPSV23) vaccine. One dose is recommended after age 29. Talk to your health care provider about which screenings and vaccines you need and how often you need them. This information is not intended to replace advice given to you by your health care provider. Make sure you discuss any questions you have with your health care provider. Document Released: 09/21/2015 Document Revised: 05/14/2016 Document Reviewed: 06/26/2015 Elsevier Interactive Patient Education  2017 [employer] Prevention in the Home Falls can cause injuries. They can happen to people of all ages. There are many things you can do to make your home safe and to help prevent falls. What can I do on the outside of my home? Regularly fix the edges of walkways and driveways and fix any cracks. Remove anything that might make you trip as you walk through a door, such as a raised step or threshold. Trim any bushes or trees on the path to your home. Use bright outdoor lighting. Clear any walking paths of anything that might make someone trip, such as rocks or tools. Regularly check to see if handrails are loose or broken. Make sure that both sides of any steps have handrails. Any raised decks and porches should have guardrails on the edges. Have any leaves, snow, or ice cleared regularly. Use sand or salt on walking paths during winter. Clean up any spills in your garage right away. This includes oil or grease spills. What can I do in the bathroom? Use night lights. Install grab bars by the toilet and in the tub and shower. Do not use towel bars as  grab bars. Use non-skid mats or decals in the tub or shower. If you need to sit down in the shower, use a plastic, non-slip stool. Keep the floor dry. Clean up any water that spills on the floor as soon as it happens. Remove soap buildup in the tub or shower regularly. Attach bath mats securely with double-sided non-slip rug tape. Do not have throw rugs and other things on the floor that can make you trip. What can I do in the bedroom? Use night lights. Make sure that you have a light by your bed that is easy to reach. Do not use any sheets or blankets that are too big for your bed. They should not hang down onto the floor. Have a firm chair that has side arms. You can use this for support while you get dressed. Do not have throw rugs and other things on the floor that can make you trip. What can I do in the kitchen? Clean up any spills right away. Avoid walking on wet floors. Keep items that you use a lot in easy-to-reach places. If you need to  reach something above you, use a strong step stool that has a grab bar. Keep electrical cords out of the way. Do not use floor polish or wax that makes floors slippery. If you must use wax, use non-skid floor wax. Do not have throw rugs and other things on the floor that can make you trip. What can I do with my stairs? Do not leave any items on the stairs. Make sure that there are handrails on both sides of the stairs and use them. Fix handrails that are broken or loose. Make sure that handrails are as long as the stairways. Check any carpeting to make sure that it is firmly attached to the stairs. Fix any carpet that is loose or worn. Avoid having throw rugs at the top or bottom of the stairs. If you do have throw rugs, attach them to the floor with carpet tape. Make sure that you have a light switch at the top of the stairs and the bottom of the stairs. If you do not have them, ask someone to add them for you. What else can I do to help prevent  falls? Wear shoes that: Do not have high heels. Have rubber bottoms. Are comfortable and fit you well. Are closed at the toe. Do not wear sandals. If you use a stepladder: Make sure that it is fully opened. Do not climb a closed stepladder. Make sure that both sides of the stepladder are locked into place. Ask someone to hold it for you, if possible. Clearly mark and make sure that you can see: Any grab bars or handrails. First and last steps. Where the edge of each step is. Use tools that help you move around (mobility aids) if they are needed. These include: Canes. Walkers. Scooters. Crutches. Turn on the lights when you go into a dark area. Replace any light bulbs as soon as they burn out. Set up your furniture so you have a clear path. Avoid moving your furniture around. If any of your floors are uneven, fix them. If there are any pets around you, be aware of where they are. Review your medicines with your doctor. Some medicines can make you feel dizzy. This can increase your chance of falling. Ask your doctor what other things that you can do to help prevent falls. This information is not intended to replace advice given to you by your health care provider. Make sure you discuss any questions you have with your health care provider. Document Released: 06/21/2009 Document Revised: 01/31/2016 Document Reviewed: 09/29/2014 Elsevier Interactive Patient Education  2017 [employer].

## 2022-10-13 ENCOUNTER — Ambulatory Visit: Payer: Medicare Other | Admitting: Cardiology

## 2022-11-06 DIAGNOSIS — G4733 Obstructive sleep apnea (adult) (pediatric): Secondary | ICD-10-CM | POA: Diagnosis not present

## 2022-12-09 ENCOUNTER — Ambulatory Visit: Payer: Medicare Other | Admitting: Cardiology

## 2023-01-19 ENCOUNTER — Telehealth: Payer: Self-pay | Admitting: *Deleted

## 2023-01-19 NOTE — Telephone Encounter (Signed)
[facility]  form faxed to [phone] on 01/16/2023 Form placed to be scan in patient chart

## 2023-02-04 DIAGNOSIS — G4733 Obstructive sleep apnea (adult) (pediatric): Secondary | ICD-10-CM | POA: Diagnosis not present

## 2023-02-07 NOTE — Progress Notes (Unsigned)
02/13/2016-77 year old male former smoker reestablishing for management of OSA, complicated by PAfib Former patient; has broken CPAP machine and will need new order. DME: [facility]. DL attached. NPSG 08/19/99- AHI 90.6/ hr, desat to 59%, body weight 226 lbs He reports using CPAP every night with original machine. The switch now has failed so he has to plug the machine and unplug it to charted on and off. Sleep quality of life much better with CPAP. He denies awareness of lung disease. No ENT surgery.  05/15/2016-77 year old male former smoker followed for OSA, complicated by PAFib CPAP 13/Advanced FOLLOWS FOR: DME:[facility] ([place] [facility]) Not in AirView and no recent DL. Pt wears CPAP every night for at least 8 hours; pressure works well for patient. No new supplies needed at this time.  He says he is very comfortable with CPAP at current pressure using nasal pillows  02/10/23- Coming to Re-establish- 77 year old male former smoker, Cannabis user,  followed for OSA, complicated by PAFib, Dilated Cardiomyopathy, HTN, Allergic Rhinitis, GERD,  CPAP 13/Advanced Download compliance- Body weight today-   ROS-see HPI   Negative unless "+" Constitutional:    weight loss, night sweats, fevers, chills, fatigue, lassitude. HEENT:    headaches, difficulty swallowing, tooth/dental problems, sore throat,       sneezing, itching, ear ache, nasal congestion, post nasal drip, snoring CV:    chest pain, orthopnea, PND, swelling in lower extremities, anasarca,                                                    dizziness, palpitations Resp:   shortness of breath with exertion or at rest.                productive cough,   non-productive cough, coughing up of blood.              change in color of mucus.  wheezing.   Skin:    rash or lesions. GI:  No-   heartburn, indigestion, abdominal pain, nausea, vomiting, diarrhea,                 change in bowel habits, loss of appetite GU: dysuria, change in color of urine, no  urgency or frequency.   flank pain. MS:   joint pain, stiffness, decreased range of motion, back pain. Neuro-     nothing unusual Psych:  change in mood or affect.  depression or anxiety.   memory loss.  OBJ- Physical Exam General- Alert, Oriented, Affect-appropriate, Distress- none acute Skin- rash-none, lesions- none, excoriation- none Lymphadenopathy- none Head- atraumatic            Eyes- Gross vision intact, PERRLA, conjunctivae and secretions clear            Ears- Hearing, canals-normal            Nose- Clear, no-Septal dev, mucus, polyps, erosion, perforation             Throat- Mallampati II-III , mucosa clear , drainage- none, tonsils- atrophic Neck- flexible , trachea midline, no stridor , thyroid nl, carotid no bruit Chest - symmetrical excursion , unlabored           Heart/CV- RRR , no murmur , no gallop  , no rub, nl s1 s2                           -  JVD- none , edema- none, stasis changes- none, varices- none           Lung- clear to P&A, wheeze- none, cough- none , dullness-none, rub- none           Chest wall-  Abd-  Br/ Gen/ Rectal- Not done, not indicated Extrem- cyanosis- none, clubbing, none, atrophy- none, strength- nl Neuro- grossly intact to observation

## 2023-02-10 ENCOUNTER — Ambulatory Visit: Payer: Medicare Other | Admitting: Internal Medicine

## 2023-02-10 ENCOUNTER — Encounter: Payer: Self-pay | Admitting: Internal Medicine

## 2023-02-10 VITALS — BP 118/58 | HR 67 | Ht 68.0 in | Wt 212.0 lb

## 2023-02-10 DIAGNOSIS — G4733 Obstructive sleep apnea (adult) (pediatric): Secondary | ICD-10-CM | POA: Diagnosis not present

## 2023-02-10 DIAGNOSIS — I420 Dilated cardiomyopathy: Secondary | ICD-10-CM

## 2023-02-10 NOTE — Patient Instructions (Signed)
Order- DME [facility]  please replace old CPAP machine, change to auto 5-20, mask of choice, humidifier, supplies, airView/ card  Please call if we can help

## 2023-02-11 ENCOUNTER — Encounter: Payer: Self-pay | Admitting: Internal Medicine

## 2023-02-11 NOTE — Assessment & Plan Note (Signed)
He continues cardiology follow-up and denies recent problems.

## 2023-02-11 NOTE — Assessment & Plan Note (Signed)
He continues to benefit from CPAP with good compliance and control Plan-replace old machine, changing to AutoPap 5-20

## 2023-03-03 DIAGNOSIS — G4733 Obstructive sleep apnea (adult) (pediatric): Secondary | ICD-10-CM | POA: Diagnosis not present

## 2023-03-09 ENCOUNTER — Telehealth: Payer: Self-pay | Admitting: Internal Medicine

## 2023-03-09 DIAGNOSIS — G4733 Obstructive sleep apnea (adult) (pediatric): Secondary | ICD-10-CM

## 2023-03-09 NOTE — Telephone Encounter (Signed)
Pt. Wife calling need pt. Cpap machine set to 13 water pressure with no ramp and need the order to say that from Dr. Maple Hudson or [facility] wont change it please advise

## 2023-03-11 NOTE — Telephone Encounter (Signed)
[facility] x1 LVM for patient's wife regarding husband's CPAP order .

## 2023-03-11 NOTE — Telephone Encounter (Signed)
Joseph Mcdaniel wife is returning phone call. Joseph Mcdaniel phone number is [phone].

## 2023-03-11 NOTE — Telephone Encounter (Signed)
Called and spoke with the pt's spouse  She states that Dr Maple Hudson said that if pt did not like auto setting 5-20, then we could change back to fixed pressure of 13  Pt states it's taking to long to ramp up  Please advise, thanks!

## 2023-03-13 NOTE — Telephone Encounter (Signed)
I called and spoke with the pt's spouse, Elease Hashimoto and notified of response per Dr. Maple Hudson  She verbalized understanding  I have placed DME referral to adjust pressure  Nothing further needed

## 2023-03-13 NOTE — Telephone Encounter (Signed)
Please have DME change pressure range to auto 10-20. Joseph Mcdaniel can let us know if this is still not ok.

## 2023-04-02 DIAGNOSIS — G4733 Obstructive sleep apnea (adult) (pediatric): Secondary | ICD-10-CM | POA: Diagnosis not present

## 2023-05-03 DIAGNOSIS — G4733 Obstructive sleep apnea (adult) (pediatric): Secondary | ICD-10-CM | POA: Diagnosis not present

## 2023-05-06 DIAGNOSIS — G4733 Obstructive sleep apnea (adult) (pediatric): Secondary | ICD-10-CM | POA: Diagnosis not present

## 2023-08-03 ENCOUNTER — Telehealth: Payer: Self-pay | Admitting: Family Medicine

## 2023-08-03 NOTE — Telephone Encounter (Signed)
LVM to get pt scheduled.

## 2023-08-03 NOTE — Telephone Encounter (Signed)
Patient is needing to schedule an appointment with Dr Jimmey Ralph ASAP.  Can someone give him a call back to get this scheduled?   Thank you,   Gabriel Cirri [facility] AW[facility] TEAM Direct Dial [phone]

## 2023-08-10 ENCOUNTER — Ambulatory Visit (INDEPENDENT_AMBULATORY_CARE_PROVIDER_SITE_OTHER): Payer: Medicare Other | Admitting: Family Medicine

## 2023-08-10 ENCOUNTER — Encounter: Payer: Self-pay | Admitting: Family Medicine

## 2023-08-10 VITALS — BP 128/74 | HR 57 | Temp 97.5°F | Ht 68.0 in | Wt 218.0 lb

## 2023-08-10 DIAGNOSIS — I10 Essential (primary) hypertension: Secondary | ICD-10-CM | POA: Diagnosis not present

## 2023-08-10 DIAGNOSIS — M1991 Primary osteoarthritis, unspecified site: Secondary | ICD-10-CM | POA: Diagnosis not present

## 2023-08-10 DIAGNOSIS — N529 Male erectile dysfunction, unspecified: Secondary | ICD-10-CM

## 2023-08-10 MED ORDER — MELOXICAM 15 MG PO TABS: 15.0000 mg | ORAL_TABLET | Freq: Every day | ORAL | 0 refills | Status: DC

## 2023-08-10 MED ORDER — TADALAFIL 20 MG PO TABS: 20.0000 mg | ORAL_TABLET | ORAL | 5 refills | Status: DC

## 2023-08-10 NOTE — Patient Instructions (Addendum)
It was very nice to see you today!  You have a flare up of sciatica.   Please start the meloxicam and work on the exercises.  We will refill your Cialis today as well.  Let us know if not improving in the next several days.  Return if symptoms worsen or fail to improve.   Take care, Dr Jimmey Ralph  PLEASE NOTE:  If you had any lab tests, please let us know if you have not heard back within a few days. You may see your results on mychart before we have a chance to review them but we will give you a call once they are reviewed by [place].   If we ordered any referrals today, please let us know if you have not heard from their office within the next week.   If you had any urgent prescriptions sent in today, please check with the pharmacy within an hour of our visit to make sure the prescription was transmitted appropriately.   Please try these tips to maintain a healthy lifestyle:  Eat at least 3 REAL meals and 1-2 snacks per day.  Aim for no more than 5 hours between eating.  If you eat breakfast, please do so within one hour of getting up.   Each meal should contain half fruits/vegetables, one quarter protein, and one quarter carbs (no bigger than a computer mouse)  Cut down on sweet beverages. This includes juice, soda, and sweet tea.   Drink at least 1 glass of water with each meal and aim for at least 8 glasses per day  Exercise at least 150 minutes every week.

## 2023-08-10 NOTE — Assessment & Plan Note (Signed)
Likely contributing to above left leg pain as well.  Also is having some worsening bilateral knee pain.  We started meloxicam as above which should help with his knee pain.  We can refer to PT or sports medicine if not improving with this.

## 2023-08-10 NOTE — Progress Notes (Signed)
   Joseph Mcdaniel is a 77 y.o. male who presents today for an office visit.  Assessment/Plan:  New/Acute Problems: Left Leg Pain Exam and history consistent with sciatica.  No red flag signs or symptoms.  Mildly positive straight leg raise on exam but otherwise reassuring exam.  He does likely have some degenerative changes in his lower back which is contributing as well.  We discussed treatment options.  He would like to avoid prednisone due to previous issues with it causing increased appetite.  We will start meloxicam 15 mg daily.  He is aware potential side effects.  We also discussed home exercises and handout was given.  He will follow-up with [place] in a couple of weeks if not improving.  At that time would consider imaging vs referral to physical therapy/sports medicine.   Chronic Problems Addressed Today: Osteoarthritis Likely contributing to above left leg pain as well.  Also is having some worsening bilateral knee pain.  We started meloxicam as above which should help with his knee pain.  We can refer to PT or sports medicine if not improving with this.  Essential hypertension Blood pressure at goal today without meds.  Erectile dysfunction Symptoms are overall stable.  He has previously been getting Cialis from urology however would like for [place] to take over this prescription.  He has been taking 10 mg daily for several years however is interested in increasing the dose.  We did discuss potential side effects including lightheadedness and dizziness.  Will send in a 20 mg tablet.  He can break in half if needed.  He will let us know if he has any significant side effects.     Subjective:  HPI:  See Assessment / plan for status of chronic conditions. Patient here with worsening left hip. Pain started about a month ago. Located on the back and outer part of his left hip.  Worse with certain motions such as getting out of the car.  Symptoms do seem to be getting worse.  Tried taking  ibuprofen at home with only modest improvement.  Sometimes gets pain radiating into his feet.  Occasionally gets numbness and tingling.  No weakness.  No report of bowel or bladder incontinence.  No urinary retention.  Having some bilateral knee pain which is near his baseline.         Objective:  Physical Exam: BP 128/74   Pulse (!) 57   Temp (!) 97.5 F (36.4 C) (Temporal)   Ht 5\' 8"  (1.727 m)   Wt 218 lb (98.9 kg)   SpO2 98%   BMI 33.15 kg/m   Gen: No acute distress, resting comfortably CV: Regular rate and rhythm with no murmurs appreciated Pulm: Normal work of breathing, clear to auscultation bilaterally with no crackles, wheezes, or rhonchi MUSCULOSKELETAL: - Back: No deformities.  Nontender to palpation - Left Leg: No deformities.  Full range of motion throughout.  Sensation light touch intact throughout.  Strength out of 5 in all fields.  Straight leg raise positive. Neuro: Grossly normal, moves all extremities Psych: Normal affect and thought content      Joseph Savoia M. Jimmey Ralph, MD 08/10/2023 7:45 AM

## 2023-08-10 NOTE — Assessment & Plan Note (Signed)
Symptoms are overall stable.  He has previously been getting Cialis from urology however would like for [place] to take over this prescription.  He has been taking 10 mg daily for several years however is interested in increasing the dose.  We did discuss potential side effects including lightheadedness and dizziness.  Will send in a 20 mg tablet.  He can break in half if needed.  He will let us know if he has any significant side effects.

## 2023-08-10 NOTE — Assessment & Plan Note (Signed)
Blood pressure at goal today without meds. 

## 2023-08-18 ENCOUNTER — Encounter: Payer: Self-pay | Admitting: Family Medicine

## 2023-09-14 ENCOUNTER — Ambulatory Visit (INDEPENDENT_AMBULATORY_CARE_PROVIDER_SITE_OTHER): Payer: Medicare Other

## 2023-09-14 VITALS — Wt 218.0 lb

## 2023-09-14 DIAGNOSIS — Z0000 Encounter for general adult medical examination without abnormal findings: Secondary | ICD-10-CM

## 2023-09-14 NOTE — Patient Instructions (Signed)
 Joseph Mcdaniel , Thank you for taking time to come for your Medicare Wellness Visit. I appreciate your ongoing commitment to your health goals. Please review the following plan we discussed and let me know if I can assist you in the future.   Referrals/Orders/Follow-Ups/Clinician Recommendations: maintain health and activity   This is a list of the screening recommended for you and due dates:  Health Maintenance  Topic Date Due   Medicare Annual Wellness Visit  09/03/2023   Flu Shot  12/07/2023*   Pneumonia Vaccine (1 of 1 - PCV) 08/09/2024*   Hepatitis C Screening  08/09/2024*   DTaP/Tdap/Td vaccine (2 - Td or Tdap) 11/06/2027   Zoster (Shingles) Vaccine  Completed   HPV Vaccine  Aged Out   Colon Cancer Screening  Discontinued   COVID-19 Vaccine  Discontinued  *Topic was postponed. The date shown is not the original due date.    Advanced directives: (Declined) Advance directive discussed with you today. Even though you declined this today, please call our office should you change your mind, and we can give you the proper paperwork for you to fill out.  Next Medicare Annual Wellness Visit scheduled for next year: Yes

## 2023-09-14 NOTE — Progress Notes (Signed)
 Subjective:   Joseph Mcdaniel is a 78 y.o. male who presents for Medicare Annual/Subsequent preventive examination.  Visit Complete: Virtual I connected with  Joseph Mcdaniel on 09/14/23 by a audio enabled telemedicine application and verified that I am speaking with the correct person using two identifiers. Wife completed for Pt due to [facility] preferred Telephonic didn't connect to video   Patient Location: Home  Provider Location: Home Office  I discussed the limitations of evaluation and management by telemedicine. The patient expressed understanding and agreed to proceed.  Vital Signs: Because this visit was a virtual/telehealth visit, some criteria may be missing or patient reported. Any vitals not documented were not able to be obtained and vitals that have been documented are patient reported.  Cardiac Risk Factors include: advanced age (>36men, >74 women);male gender;obesity (BMI >30kg/m2)     Objective:    Today's Vitals   09/14/23 1343  Weight: 218 lb (98.9 kg)   Body mass index is 33.15 kg/m.     09/14/2023    1:50 PM 09/02/2022    8:04 AM 12/22/2018    1:09 PM 09/10/2017    6:45 PM 01/04/2016    2:09 PM 08/31/2012    1:49 AM  Advanced Directives  Does Patient Have a Medical Advance Directive? No Yes Yes No No Patient does not have advance directive;Patient would not like information  Type of Customer Service Manager Power of [place];Living will      Does patient want to make changes to medical advance directive?   No - Patient declined     Copy of Healthcare Power of Attorney in Chart?  No - copy requested      Would patient like information on creating a medical advance directive? No - Patient declined         Current Medications (verified) Outpatient Encounter Medications as of 09/14/2023  Medication Sig   aspirin  81 MG chewable tablet Chew by mouth.   Ca Carbonate-Mag Hydroxide (ROLAIDS PO) Take by mouth as needed.   Cholecalciferol (VITAMIN D) 125 MCG  (5000 UT) CAPS Take 1 capsule by mouth daily.   Coenzyme Q10 (COQ10) 100 MG CAPS Take by mouth.   fluticasone  (FLOVENT  HFA) 220 MCG/ACT inhaler    meloxicam  (MOBIC ) 15 MG tablet Take 1 tablet (15 mg total) by mouth daily.   Multiple Vitamin (MULTIVITAMIN WITH MINERALS) TABS tablet Take 1 tablet by mouth daily.   tadalafil  (CIALIS ) 20 MG tablet Take 1 tablet (20 mg total) by mouth every other day.   Zinc 30 MG CAPS Take by mouth.   mineral oil liquid Take 15 mLs by mouth at bedtime as needed for moderate constipation. (Patient not taking: Reported on 09/14/2023)   [DISCONTINUED] ibuprofen  (ADVIL ) 800 MG tablet Take 1 tablet (800 mg total) by mouth every 8 (eight) hours as needed for moderate pain. Take after eating.   [DISCONTINUED] ipratropium (ATROVENT) 0.06 % nasal spray Place into both nostrils as needed.   No facility-administered encounter medications on file as of 09/14/2023.    Allergies (verified) Codeine, Hydrocodone, Omeprazole , and Penicillins   History: Past Medical History:  Diagnosis Date   A-fib (HCC) with spontaneous convert to NSR 08/2012   Adenomatous colon polyp 09/2007   Anxiety    Cardiac CT on 5/11, calcium score 0    Cataracts, bilateral    DDD (degenerative disc disease)    ED (erectile dysfunction)    Fibromyalgia    Generalized OA    GERD (gastroesophageal reflux  disease)    Myalgia and myositis    OSA (obstructive sleep apnea), compliant with CPAP    Sleep apnea    cpap   Vitamin D deficiency    Past Surgical History:  Procedure Laterality Date   CATARACT EXTRACTION Right 02/2019   CHONDROPLASTY Left    COLONOSCOPY     LIPOMA EXCISION     LUMBAR LAMINECTOMY     POLYPECTOMY     ROTATOR CUFF REPAIR Right    UPPER GASTROINTESTINAL ENDOSCOPY     Family History  Problem Relation Age of Onset   Breast cancer Mother    Diabetes Maternal Aunt    Heart attack Father 69   Colon cancer Neg Hx    Colon polyps Neg Hx    Rectal cancer Neg Hx    Stomach  cancer Neg Hx    Esophageal cancer Neg Hx    Social History   Socioeconomic History   Marital status: Married    Spouse name: Not on file   Number of children: 4   Years of education: Not on file   Highest education level: Not on file  Occupational History   Occupation: Retired    Comment: PT @ [employer]  Tobacco Use   Smoking status: Former    Current packs/day: 0.00    Average packs/day: 1.5 packs/day for 30.0 years (45.0 ttl pk-yrs)    Types: Cigarettes    Start date: 09/08/1964    Quit date: 09/08/1994    Years since quitting: 29.0   Smokeless tobacco: Never   Tobacco comments:    Quit smoking 1996, smoked for 30 years, pt smoke 3 packs per day for 1-2 years.   Vaping Use   Vaping status: Never Used  Substance and Sexual Activity   Alcohol use: Yes    Alcohol/week: 3.0 standard drinks of alcohol    Types: 3 Cans of beer per week    Comment: occ beer on weekends   Drug use: No   Sexual activity: Not on file  Other Topics Concern   Not on file  Social History Narrative   Patient disabled due to multiple orthopedic problems.   Social Drivers of Corporate Investment Banker Strain: Low Risk  (09/14/2023)   Overall Financial Resource Strain (CARDIA)    Difficulty of Paying Living Expenses: Not hard at all  Food Insecurity: No Food Insecurity (09/14/2023)   Hunger Vital Sign    Worried About Running Out of Food in the Last Year: Never true    Ran Out of Food in the Last Year: Never true  Transportation Needs: No Transportation Needs (09/14/2023)   PRAPARE - Administrator, Civil Service (Medical): No    Lack of Transportation (Non-Medical): No  Physical Activity: Inactive (09/14/2023)   Exercise Vital Sign    Days of Exercise per Week: 0 days    Minutes of Exercise per Session: 0 min  Stress: No Stress Concern Present (09/14/2023)   [employer] of Occupational Health - Occupational Stress Questionnaire    Feeling of Stress : Not at all  Social Connections:  Socially Integrated (09/14/2023)   Social Connection and Isolation Panel [NHANES]    Frequency of Communication with Friends and Family: More than three times a week    Frequency of Social Gatherings with Friends and Family: More than three times a week    Attends Religious Services: More than 4 times per year    Active Member of [employer] or Organizations: Yes  Attends Banker Meetings: 1 to 4 times per year    Marital Status: Married    Tobacco Counseling Counseling given: Not Answered Tobacco comments: Quit smoking 1996, smoked for 30 years, pt smoke 3 packs per day for 1-2 years.    Clinical Intake:  Pre-visit preparation completed: Yes  Pain : No/denies pain     BMI - recorded: 33.15 Nutritional Status: BMI > 30  Obese Diabetes: No  How often do you need to have someone help you when you read instructions, pamphlets, or other written materials from your doctor or pharmacy?: 1 - Never  Interpreter Needed?: No  Information entered by :: Ellouise Haws, LPN   Activities of Daily Living    09/14/2023    1:46 PM  In your present state of health, do you have any difficulty performing the following activities:  Hearing? 1  Comment HOH  Vision? 0  Difficulty concentrating or making decisions? 0  Walking or climbing stairs? 0  Dressing or bathing? 0  Doing errands, shopping? 0  Preparing Food and eating ? N  Using the Toilet? N  In the past six months, have you accidently leaked urine? N  Do you have problems with loss of bowel control? N  Managing your Medications? N  Managing your Finances? N  Housekeeping or managing your Housekeeping? N    Patient Care Team: Kennyth Worth HERO, MD as PCP - General (Family Medicine) Lavona Agent, MD as PCP - Cardiology (Cardiology) Gerome Charleston, MD (Inactive) as Consulting Physician (Orthopedic Surgery)  Indicate any recent Medical Services you may have received from other than [facility] providers in the past year (date  may be approximate).     Assessment:   This is a routine wellness examination for Glennon.  Hearing/Vision screen Hearing Screening - Comments:: HOH Vision Screening - Comments:: Pt follows up with dr Octavia for annual eye exams    Goals Addressed             This Visit's Progress    Patient Stated       Stay healthy and active        Depression Screen    09/14/2023    1:51 PM 08/10/2023    7:26 AM 09/02/2022    8:02 AM 08/15/2021    9:55 AM 05/21/2021    1:40 PM 10/12/2020    8:54 AM 10/11/2019    8:45 AM  PHQ 2/9 Scores  PHQ - 2 Score 0 0 0 0 0 0 0    Fall Risk    09/14/2023    1:52 PM 08/10/2023    7:26 AM 09/02/2022    8:04 AM 08/15/2021    9:55 AM 10/12/2020    8:54 AM  Fall Risk   Falls in the past year? 0 0 1 0 0  Number falls in past yr: 0 0 1 0   Injury with Fall? 0 0 0 0   Risk for fall due to : No Fall Risks No Fall Risks Impaired vision;Impaired balance/gait;Impaired mobility;History of fall(s) History of fall(s);No Fall Risks   Follow up Falls prevention discussed  Falls prevention discussed      MEDICARE RISK AT HOME: Medicare Risk at Home Any stairs in or around the home?: Yes If so, are there any without handrails?: No Home free of loose throw rugs in walkways, pet beds, electrical cords, etc?: Yes Adequate lighting in your home to reduce risk of falls?: Yes Life alert?: No Use of a cane, walker  or w/c?: No Grab bars in the bathroom?: Yes Shower chair or bench in shower?: Yes Elevated toilet seat or a handicapped toilet?: No  TIMED UP AND GO:  Was the test performed?  No    Cognitive Function:Declined at this time     09/14/2023    1:53 PM  MMSE - Mini Mental State Exam  Not completed: Unable to complete        09/02/2022    8:06 AM  6CIT Screen  What Year? 0 points  What month? 0 points  What time? 0 points  Count back from 20 0 points  Months in reverse 0 points  Repeat phrase 0 points  Total Score 0 points     Immunizations Immunization History  Administered Date(s) Administered   Fluad Quad(high Dose 65+) 07/11/2019   Influenza Split 05/09/2014   Influenza, High Dose Seasonal PF 07/01/2018   Pneumococcal-Unspecified 05/09/2014   Tdap 11/05/2017   Zoster Recombinant(Shingrix ) 01/19/2019, 10/11/2019    TDAP status: Up to date  Flu Vaccine status: Due, Education has been provided regarding the importance of this vaccine. Advised may receive this vaccine at local pharmacy or Health Dept. Aware to provide a copy of the vaccination record if obtained from local pharmacy or Health Dept. Verbalized acceptance and understanding.  Pneumococcal vaccine status: Due, Education has been provided regarding the importance of this vaccine. Advised may receive this vaccine at local pharmacy or Health Dept. Aware to provide a copy of the vaccination record if obtained from local pharmacy or Health Dept. Verbalized acceptance and understanding.  Covid-19 vaccine status: Declined, Education has been provided regarding the importance of this vaccine but patient still declined. Advised may receive this vaccine at local pharmacy or Health Dept.or vaccine clinic. Aware to provide a copy of the vaccination record if obtained from local pharmacy or Health Dept. Verbalized acceptance and understanding.  Qualifies for Shingles Vaccine? Yes   Zostavax completed Yes   Shingrix  Completed?: Yes  Screening Tests Health Maintenance  Topic Date Due   INFLUENZA VACCINE  12/07/2023 (Originally 04/09/2023)   Pneumonia Vaccine 70+ Years old (1 of 1 - PCV) 08/09/2024 (Originally 10/09/2011)   Hepatitis C Screening  08/09/2024 (Originally 10/08/1963)   Medicare Annual Wellness (AWV)  09/13/2024   DTaP/Tdap/Td (2 - Td or Tdap) 11/06/2027   Zoster Vaccines- Shingrix   Completed   HPV VACCINES  Aged Out   Colonoscopy  Discontinued   COVID-19 Vaccine  Discontinued    Health Maintenance  There are no preventive care reminders  to display for this patient.   Colorectal cancer screening: No longer required.   Additional Screening:  Hepatitis C Screening: does qualify  Vision Screening: Recommended annual ophthalmology exams for early detection of glaucoma and other disorders of the eye. Is the patient up to date with their annual eye exam?  Yes  Who is the provider or what is the name of the office in which the patient attends annual eye exams? Dr Octavia  If pt is not established with a provider, would they like to be referred to a provider to establish care? No .   Dental Screening: Recommended annual dental exams for proper oral hygiene  Community Resource Referral / Chronic Care Management: CRR required this visit?  No   CCM required this visit?  No     Plan:     I have personally reviewed and noted the following in the patient's chart:   Medical and social history Use of alcohol, tobacco or illicit  drugs  Current medications and supplements including opioid prescriptions. Patient is not currently taking opioid prescriptions. Functional ability and status Nutritional status Physical activity Advanced directives List of other physicians Hospitalizations, surgeries, and ER visits in previous 12 months Vitals Screenings to include cognitive, depression, and falls Referrals and appointments  In addition, I have reviewed and discussed with patient certain preventive protocols, quality metrics, and best practice recommendations. A written personalized care plan for preventive services as well as general preventive health recommendations were provided to patient.     Ellouise VEAR Haws, LPN   04/10/7973   After Visit Summary: (MyChart) Due to this being a telephonic visit, the after visit summary with patients personalized plan was offered to patient via MyChart   Nurse Notes: Pt declined cognition at this time [facility] wife completed Awv

## 2023-11-03 ENCOUNTER — Encounter: Payer: Self-pay | Admitting: Gastroenterology

## 2023-11-03 ENCOUNTER — Other Ambulatory Visit (INDEPENDENT_AMBULATORY_CARE_PROVIDER_SITE_OTHER): Payer: Medicare Other

## 2023-11-03 ENCOUNTER — Ambulatory Visit: Payer: Medicare Other | Admitting: Gastroenterology

## 2023-11-03 VITALS — BP 110/82 | HR 63 | Ht 68.0 in | Wt 226.1 lb

## 2023-11-03 DIAGNOSIS — K219 Gastro-esophageal reflux disease without esophagitis: Secondary | ICD-10-CM

## 2023-11-03 DIAGNOSIS — K5909 Other constipation: Secondary | ICD-10-CM

## 2023-11-03 DIAGNOSIS — K5900 Constipation, unspecified: Secondary | ICD-10-CM

## 2023-11-03 DIAGNOSIS — K429 Umbilical hernia without obstruction or gangrene: Secondary | ICD-10-CM | POA: Diagnosis not present

## 2023-11-03 LAB — TSH: TSH: 2.36 u[IU]/mL (ref 0.35–5.50)

## 2023-11-03 MED ORDER — LINACLOTIDE 145 MCG PO CAPS: 145.0000 ug | ORAL_CAPSULE | Freq: Every day | ORAL | 0 refills | Status: DC

## 2023-11-03 MED ORDER — PANTOPRAZOLE SODIUM 40 MG PO TBEC: 40.0000 mg | DELAYED_RELEASE_TABLET | Freq: Every day | ORAL | 2 refills | Status: DC

## 2023-11-03 NOTE — Patient Instructions (Addendum)
 Your provider has requested that you go to the basement level for lab work before leaving today. Press "B" on the elevator. The lab is located at the first door on the left as you exit the elevator.  Due to recent changes in healthcare laws, you may see the results of your imaging and laboratory studies on MyChart before your provider has had a chance to review them.  We understand that in some cases there may be results that are confusing or concerning to you. Not all laboratory results come back in the same time frame and the provider may be waiting for multiple results in order to interpret others.  Please give [place] 48 hours in order for your provider to thoroughly review all the results before contacting the office for clarification of your results.    We have sent the following medications to your pharmacy for you to pick up at your convenience: Pantoprazole 40 mg once daily.  We have given you samples of the following medication to take: Linzess 145 mcg take daily 30 minutes before a meal.  You have been scheduled for a follow up appointment on Tuesday, 12/15/23 at 9:20 AM. Please arrive 10 minutes early for registration. If you need to reschedule or cancel this appointment please call [phone] as soon as possible. Thank you.  Thank you for trusting me with your gastrointestinal care!   Boone Master, PA   _______________________________________________________  If your blood pressure at your visit was 140/90 or greater, please contact your primary care physician to follow up on this.  _______________________________________________________  If you are age 21 or older, your body mass index should be between 23-30. Your Body mass index is 34.38 kg/m. If this is out of the aforementioned range listed, please consider follow up with your Primary Care Provider.  If you are age 30 or younger, your body mass index should be between 19-25. Your Body mass index is 34.38 kg/m. If this is  out of the aformentioned range listed, please consider follow up with your Primary Care Provider.   ________________________________________________________  The [facility] providers would like to encourage you to use [facility] to communicate with providers for non-urgent requests or questions.  Due to long hold times on the telephone, sending your provider a message by [facility] may be a faster and more efficient way to get a response.  Please allow 48 business hours for a response.  Please remember that this is for non-urgent requests.  _______________________________________________________   .

## 2023-11-03 NOTE — Progress Notes (Signed)
 Chief Complaint: GERD and constipation Primary GI MD: Dr. Adela Lank  HPI: 78 year old male history of colon polyps, constipation, GERD, presents for evaluation of GERD and constipation.  Patient was last seen by Dr. Adela Lank April 2023 and at that time he reported longstanding constipation.  Dr. Adela Lank recommended MiraLAX and he provided samples of Linzess 72 mcg.  He also is history of reflux which at that time was well-controlled on PPI every few days.  He has recent endoscopy in 2022 which showed no high risk lesions or Barrett's esophagus.  ---------------TODAY------------------------  He has been experiencing worsening constipation that has become unmanageable at home. Despite using Miralax, Benefiber, enemas, laxatives, and stool softeners daily, these measures have not been effective. He did not try previously recommended linzess. His stool is described as 'pencil thin', and it takes days to achieve a bowel movement, which is part of a recurring cycle. He maintains a good diet, is very active, and consistently drinks water or apple juice. He takes Rolaids three to four times a day, which he acknowledges may worsen his constipation.   He reports severe heartburn that he describes as 'unimaginable.' He has previously used omeprazole, but it was not effective when taken intermittently so he stopped it all together. He is unsure of the dosage he was taking. His last endoscopy showed no abnormalities.  No current use of meloxicam.  He does note his diet is consistent with acidic foods, caffeine, and spicy foods which exacerbate his symptoms  He mentions a possible hernia at his belly button, which has not been formally diagnosed but is visible. He is concerned about its potential impact on his digestive issues, although it is not currently painful.      PREVIOUS GI WORKUP   Colonoscopy 02/10/2022 - Small lipoma in the sigmoid colon.  - Internal hemorrhoids. - The examination was  otherwise normal. - Two 3 mm polyps in the cecum, removed with a cold snare. Resected and retrieved.  - One 3 to 4 mm polyp in the transverse colon, removed with a cold snare. Resected and retrieved. - no surveillance recommended  Diagnosis 1. Surgical [P], colon, cecum, polyp (2) FRAGMENTS OF LARGE BOWEL MUCOSA, SOME OF WHICH ARE POLYPOID, SHOWING NO SIGNIFICANT INFLAMMATION, CHRONIC CRYPT ARCHITECTURAL CHANGES, DYSPLASIA OR MALIGNANCY. RARE LYMPHOID AGGREGATE IS NOTED. 2. Surgical [P], colon, transverse, polyp (1) FINDINGS CONSISTENT WITH TUBULAR ADENOMA. NO HIGH-GRADE DYSPLASIA OR MALIGNANCY IS SEEN. CLINICAL AND ENDOSCOPIC CORRELATION IS REQUIRED.  Colonoscopy 02/23/19 - The perianal and digital rectal examinations were normal. - Two sessile polyps were found in the cecum. The polyps were diminutive in size. These polyps were removed with a cold biopsy forceps. Resection and retrieval were complete. - Two sessile polyps were found in the transverse colon. The polyps were 3 mm in size. These polyps were removed with a cold snare. Resection and retrieval were complete. - A 3 mm polyp was found in the descending colon. The polyp was sessile. The polyp was removed with a cold snare. Resection and retrieval were complete. - A 3 mm polyp was found in the sigmoid colon. The polyp was sessile. The polyp was removed with a cold snare. Resection and retrieval were complete. - There was a small lipoma, in the sigmoid colon. - Internal hemorrhoids were found. - The prep was fair on insertion. Several minutes spent lavaging the colon to achieve adequate views. The exam was otherwise without abnormality.   Surgical [P], cecum (cold forcep), transverse, descending and sigmoid, polyp (5) -  TUBULAR ADENOMA(S). - INFLAMMATORY POLYP. - NO HIGH GRADE DYSPLASIA OR MALIGNANCY.   Recommended repeat in 3 years   Colonoscopy 01/04/16 - The perianal and digital rectal examinations were normal. - A 4 mm  polyp was found in the ascending colon. The polyp was sessile. The polyp was removed with a cold snare. Resection and retrieval were complete. - A 5 mm polyp was found in the transverse colon. The polyp was sessile. The polyp was removed with a cold snare. Resection and retrieval were complete. - A 4 mm polyp was found in the sigmoid colon. The polyp was sessile. The polyp was removed with a cold snare. Resection and retrieval were complete. - The terminal ileum appeared normal. - Non-bleeding internal hemorrhoids were found during retroflexion. The hemorrhoids were small. - The exam was otherwise without abnormality. No abnormality noted which could cause the patient's changes in bowel symptoms, which perhaps were due to change in medications (narcotics)     Colonoscopy 11/05/2011 - lipoma, hyperplastic polyps Colonoscopy 09/2007 - 1cm polyp - tubulovillous adenoma   Nuclear stress 08/21/17 - normal exam, EF 47%    EGD 02/27/21: - Very mild focal / healing esophagitis was found 39 cm from the incisors. - The exam of the esophagus was otherwise normal. No Barrett's esophagus - The entire examined stomach was normal. - The duodenal bulb and second portion of the duodenum were normal.     Echo 11/13/21: EF 55-60%, valves okay  Past Medical History:  Diagnosis Date   A-fib (HCC) with spontaneous convert to NSR 08/2012   Adenomatous colon polyp 09/2007   Anxiety    Cardiac CT on 5/11, calcium score 0    Cataracts, bilateral    DDD (degenerative disc disease)    ED (erectile dysfunction)    Fibromyalgia    Generalized OA    GERD (gastroesophageal reflux disease)    Myalgia and myositis    OSA (obstructive sleep apnea), compliant with CPAP    Sleep apnea    cpap   Vitamin D deficiency     Past Surgical History:  Procedure Laterality Date   CATARACT EXTRACTION Right 02/2019   CHONDROPLASTY Left    COLONOSCOPY     LIPOMA EXCISION     LUMBAR LAMINECTOMY     POLYPECTOMY      ROTATOR CUFF REPAIR Right    UPPER GASTROINTESTINAL ENDOSCOPY      Current Outpatient Medications  Medication Sig Dispense Refill   aspirin 81 MG chewable tablet Chew by mouth.     Ca Carbonate-Mag Hydroxide (ROLAIDS PO) Take by mouth as needed.     Cholecalciferol (VITAMIN D) 125 MCG (5000 UT) CAPS Take 1 capsule by mouth daily.     Coenzyme Q10 (COQ10) 100 MG CAPS Take by mouth.     fluticasone (FLOVENT HFA) 220 MCG/ACT inhaler      meloxicam (MOBIC) 15 MG tablet Take 1 tablet (15 mg total) by mouth daily. 30 tablet 0   Multiple Vitamin (MULTIVITAMIN WITH MINERALS) TABS tablet Take 1 tablet by mouth daily.     pantoprazole (PROTONIX) 40 MG tablet Take 1 tablet (40 mg total) by mouth daily. 30 tablet 2   tadalafil (CIALIS) 20 MG tablet Take 1 tablet (20 mg total) by mouth every other day. 30 tablet 5   Zinc 30 MG CAPS Take by mouth.     mineral oil liquid Take 15 mLs by mouth at bedtime as needed for moderate constipation. (Patient not taking: Reported on 11/03/2023)  No current facility-administered medications for this visit.    Allergies as of 11/03/2023 - Review Complete 11/03/2023  Allergen Reaction Noted   Codeine Itching    Hydrocodone Itching    Omeprazole Itching 01/07/2022   Penicillins Hives and Itching     Family History  Problem Relation Age of Onset   Breast cancer Mother    Diabetes Maternal Aunt    Heart attack Father 31   Colon cancer Neg Hx    Colon polyps Neg Hx    Rectal cancer Neg Hx    Stomach cancer Neg Hx    Esophageal cancer Neg Hx     Social History   Socioeconomic History   Marital status: Married    Spouse name: Not on file   Number of children: 4   Years of education: Not on file   Highest education level: Not on file  Occupational History   Occupation: Retired    Comment: PT @ [employer]  Tobacco Use   Smoking status: Former    Current packs/day: 0.00    Average packs/day: 1.5 packs/day for 30.0 years (45.0 ttl pk-yrs)    Types:  Cigarettes    Start date: 09/08/1964    Quit date: 09/08/1994    Years since quitting: 29.1   Smokeless tobacco: Never   Tobacco comments:    Quit smoking 1996, smoked for 30 years, pt smoke 3 packs per day for 1-2 years.   Vaping Use   Vaping status: Never Used  Substance and Sexual Activity   Alcohol use: Yes    Alcohol/week: 3.0 standard drinks of alcohol    Types: 3 Cans of beer per week    Comment: occ beer on weekends   Drug use: No   Sexual activity: Not on file  Other Topics Concern   Not on file  Social History Narrative   Patient disabled due to multiple orthopedic problems.   Social Drivers of Corporate investment banker Strain: Low Risk  (09/14/2023)   Overall Financial Resource Strain (CARDIA)    Difficulty of Paying Living Expenses: Not hard at all  Food Insecurity: No Food Insecurity (09/14/2023)   Hunger Vital Sign    Worried About Running Out of Food in the Last Year: Never true    Ran Out of Food in the Last Year: Never true  Transportation Needs: No Transportation Needs (09/14/2023)   PRAPARE - Administrator, Civil Service (Medical): No    Lack of Transportation (Non-Medical): No  Physical Activity: Inactive (09/14/2023)   Exercise Vital Sign    Days of Exercise per Week: 0 days    Minutes of Exercise per Session: 0 min  Stress: No Stress Concern Present (09/14/2023)   [employer] of Occupational Health - Occupational Stress Questionnaire    Feeling of Stress : Not at all  Social Connections: Socially Integrated (09/14/2023)   Social Connection and Isolation Panel [NHANES]    Frequency of Communication with Friends and Family: More than three times a week    Frequency of Social Gatherings with Friends and Family: More than three times a week    Attends Religious Services: More than 4 times per year    Active Member of [employer] or Organizations: Yes    Attends Banker Meetings: 1 to 4 times per year    Marital Status: Married  Careers information officer Violence: Not At Risk (09/14/2023)   Humiliation, Afraid, Rape, and Kick questionnaire    Fear of Current or  Ex-Partner: No    Emotionally Abused: No    Physically Abused: No    Sexually Abused: No    Review of Systems:    Constitutional: No weight loss, fever, chills, weakness or fatigue HEENT: Eyes: No change in vision               Ears, Nose, Throat:  No change in hearing or congestion Skin: No rash or itching Cardiovascular: No chest pain, chest pressure or palpitations   Respiratory: No SOB or cough Gastrointestinal: See HPI and otherwise negative Genitourinary: No dysuria or change in urinary frequency Neurological: No headache, dizziness or syncope Musculoskeletal: No new muscle or joint pain Hematologic: No bleeding or bruising Psychiatric: No history of depression or anxiety    Physical Exam:  Vital signs: BP 110/82   Pulse 63   Ht 5\' 8"  (1.727 m)   Wt 226 lb 2 oz (102.6 kg)   BMI 34.38 kg/m   Constitutional: NAD, Well developed, Well nourished, alert and cooperative Head:  Normocephalic and atraumatic. Eyes:   PEERL, EOMI. No icterus. Conjunctiva pink. Respiratory: Respirations even and unlabored. Lungs clear to auscultation bilaterally.   No wheezes, crackles, or rhonchi.  Cardiovascular:  Regular rate and rhythm. No peripheral edema, cyanosis or pallor.  Gastrointestinal:  Soft, nondistended, nontender. No rebound or guarding. Normal bowel sounds. No appreciable masses or hepatomegaly.  Umbilical hernia noted Rectal:  Not performed.  Msk:  Symmetrical without gross deformities. Without edema, no deformity or joint abnormality.  Neurologic:  Alert and  oriented x4;  grossly normal neurologically.  Skin:   Dry and intact without significant lesions or rashes. Psychiatric: Oriented to person, place and time. Demonstrates good judgement and reason without abnormal affect or behaviors.  RELEVANT LABS AND IMAGING: CBC    Component Value Date/Time   WBC 7.2  05/19/2022 1041   WBC 8.5 02/21/2019 1516   RBC 5.38 05/19/2022 1041   RBC 4.81 02/21/2019 1516   HGB 15.8 05/19/2022 1041   HCT 48.5 05/19/2022 1041   PLT 192 05/19/2022 1041   MCV 90 05/19/2022 1041   MCH 29.4 05/19/2022 1041   MCH 30.0 09/10/2017 2010   MCHC 32.6 05/19/2022 1041   MCHC 33.4 02/21/2019 1516   RDW 13.2 05/19/2022 1041   LYMPHSABS 1.9 02/21/2019 1516   MONOABS 0.6 02/21/2019 1516   EOSABS 0.2 02/21/2019 1516   BASOSABS 0.1 02/21/2019 1516    CMP     Component Value Date/Time   NA 141 05/19/2022 1041   K 4.6 05/19/2022 1041   CL 103 05/19/2022 1041   CO2 25 05/19/2022 1041   GLUCOSE 93 05/19/2022 1041   GLUCOSE 93 02/21/2019 1516   BUN 17 05/19/2022 1041   CREATININE 0.97 05/19/2022 1041   CALCIUM 8.6 05/19/2022 1041   PROT 6.7 02/21/2019 1516   ALBUMIN 4.2 02/21/2019 1516   AST 16 02/21/2019 1516   ALT 18 02/21/2019 1516   ALKPHOS 63 02/21/2019 1516   BILITOT 0.5 02/21/2019 1516   GFRNONAA >60 09/10/2017 2010   GFRAA >60 09/10/2017 2010     Assessment/Plan:      GERD Recurrent heartburn not on any antacids at this time and often consumes offending trigger foods that exacerbate his symptoms.  No evidence of Barrett's esophagus on previous endoscopy.  No current NSAID use. --Start Pantoprazole 40mg  daily 30 minutes before breakfast for 60 days. --Provide patient with a reflux diet printout. -- Educated patient on lifestyle modifications. - If continued symptoms despite PPI  could consider repeat EGD -- Time spent with wife going over GERD handout in regards to specific foods as she is concerned because it is "most of his diet" and she is concerned it is unrealistic for him to follow. Discussed it's more of a guideline and just to try to avoid trigger foods (ascitic, spicy, fatty) if possible.  Chronic Constipation Unmanageable despite use of Miralax, fiber, enemas, laxatives, and stool softeners. No medications contributing to constipation. Possible  slow transit colon or pelvic floor issue. Declined rectal exam.  Reassuringly up-to-date on colonoscopy --Discontinue Miralax. --Provided Linzess 145 mcg for trial --Encourage use of a squatty potty to aid bowel movements. --Check thyroid function to rule out hypothyroidism as a cause of constipation.  Umbilical Hernia Visible but not painful. Not likely contributing to digestive issues. -No intervention needed unless it becomes painful.  Follow-up in 6-8 weeks to assess response to treatment. Patient to contact clinic if issues arise before the follow-up appointment.      Lara Mulch [facility] 11/03/2023, 9:43 AM  Cc: Ardith Dark, MD

## 2023-11-03 NOTE — Progress Notes (Signed)
 Agree with assessment and plan as outlined.

## 2023-11-24 ENCOUNTER — Encounter: Payer: Self-pay | Admitting: Family Medicine

## 2023-11-24 ENCOUNTER — Ambulatory Visit (INDEPENDENT_AMBULATORY_CARE_PROVIDER_SITE_OTHER): Admitting: Family Medicine

## 2023-11-24 VITALS — BP 122/79 | HR 60 | Temp 98.4°F | Ht 68.0 in | Wt 228.0 lb

## 2023-11-24 DIAGNOSIS — M1991 Primary osteoarthritis, unspecified site: Secondary | ICD-10-CM

## 2023-11-24 DIAGNOSIS — I480 Paroxysmal atrial fibrillation: Secondary | ICD-10-CM

## 2023-11-24 DIAGNOSIS — J029 Acute pharyngitis, unspecified: Secondary | ICD-10-CM

## 2023-11-24 DIAGNOSIS — I10 Essential (primary) hypertension: Secondary | ICD-10-CM | POA: Diagnosis not present

## 2023-11-24 LAB — POCT RAPID STREP A (OFFICE): Rapid Strep A Screen: POSITIVE — AB

## 2023-11-24 MED ORDER — PROMETHAZINE-DM 6.25-15 MG/5ML PO SYRP
5.0000 mL | ORAL_SOLUTION | Freq: Four times a day (QID) | ORAL | 0 refills | Status: DC | PRN
Start: 2023-11-24 — End: 2023-12-15

## 2023-11-24 MED ORDER — AZITHROMYCIN 250 MG PO TABS: ORAL_TABLET | ORAL | 0 refills | Status: DC

## 2023-11-24 NOTE — Assessment & Plan Note (Signed)
At goal today without meds. 

## 2023-11-24 NOTE — Assessment & Plan Note (Signed)
Regular rate and rhythm today.  Continue management per cardiology. 

## 2023-11-24 NOTE — Assessment & Plan Note (Signed)
 Overall symptoms are about the same as her last visit.  His knee pain has improved though still having some sciatica symptoms.  He does have meloxicam to use as needed.  We did discuss referral to see sports medicine however he declined.  He will let us know if he changes his mind.

## 2023-11-24 NOTE — Progress Notes (Signed)
   Joseph Mcdaniel is a 78 y.o. male who presents today for an office visit.  Assessment/Plan:  New/Acute Problems: Sore throat Rapid strep positive.  He has a penicillin allergy.  Will start azithromycin.  [place] with this in the past.  Also start promethazine-dextromethorphan cough syrup.  Encouraged hydration.  He can use over-the-counter meds as needed as well.  We discussed reasons to return to care.  Follow-up as needed.  Chronic Problems Addressed Today: Essential hypertension At goal today without meds.  PAF (paroxysmal atrial fibrillation) (HCC) Regular rate and rhythm today.  Continue management per cardiology.  Osteoarthritis Overall symptoms are about the same as her last visit.  His knee pain has improved though still having some sciatica symptoms.  He does have meloxicam to use as needed.  We did discuss referral to see sports medicine however he declined.  He will let us know if he changes his mind.     Subjective:  HPI:  Patient here sore throat and congestion. Started a couple of weeks ago. Getting worse the last few days.  Tried OTC medications without much improvement.  A lot of runny nose and cough. No fevers or chills.         Objective:  Physical Exam: BP 122/79   Pulse 60   Temp 98.4 F (36.9 C) (Temporal)   Ht 5\' 8"  (1.727 m)   Wt 228 lb (103.4 kg)   SpO2 97%   BMI 34.67 kg/m   Gen: No acute distress, resting comfortably HEENT: tympanic membranes clear bilaterally.  OP erythematous. CV: Regular rate and rhythm with no murmurs appreciated Pulm: Normal work of breathing, clear to auscultation bilaterally with no crackles, wheezes, or rhonchi Neuro: Grossly normal, moves all extremities Psych: Normal affect and thought content      Boniface Goffe M. Jimmey Ralph, MD 11/24/2023 1:41 PM

## 2023-11-24 NOTE — Patient Instructions (Signed)
 It was very nice to see you today!  Your strep test is positive.  Start the azithromycin.  Also use the cough syrup as needed.  Let us know if not improving.  Return if symptoms worsen or fail to improve.   Take care, Dr Jimmey Ralph  PLEASE NOTE:  If you had any lab tests, please let us know if you have not heard back within a few days. You may see your results on mychart before we have a chance to review them but we will give you a call once they are reviewed by [place].   If we ordered any referrals today, please let us know if you have not heard from their office within the next week.   If you had any urgent prescriptions sent in today, please check with the pharmacy within an hour of our visit to make sure the prescription was transmitted appropriately.   Please try these tips to maintain a healthy lifestyle:  Eat at least 3 REAL meals and 1-2 snacks per day.  Aim for no more than 5 hours between eating.  If you eat breakfast, please do so within one hour of getting up.   Each meal should contain half fruits/vegetables, one quarter protein, and one quarter carbs (no bigger than a computer mouse)  Cut down on sweet beverages. This includes juice, soda, and sweet tea.   Drink at least 1 glass of water with each meal and aim for at least 8 glasses per day  Exercise at least 150 minutes every week.

## 2023-12-14 NOTE — Progress Notes (Unsigned)
 Chief Complaint: Follow-up Primary GI MD: Dr. Adela Lank  HPI: 78 year old male history of colon polyps, constipation, GERD, presents for follow-up.  Last seen 11/03/2023: At that time was having recurrent heartburn and no evidence of Barrett's esophagus on previous endoscopy.  Patient was started on pantoprazole 40 Mg once daily and educated on lifestyle modifications.  He also had continued chronic constipation despite use of MiraLAX, fiber, enemas, laxatives, and stool softeners.  He was given trial of Linzess 145 mcg  -------------------TODAY---------------------  Patient states his upper GI symptoms have been very well-controlled with pantoprazole 40 Mg once daily and diet lifestyle modifications.  He states he did eat pizza last night and this caused a flareup of his GERD.  However, besides certain trigger foods he has adequate control and feels much improved.  He was on Linzess 145 mcg which was giving him adequate bowel movements.  Occasionally he would have some loose stools so unsure if he would benefit from the lower dose.  However, he has been out of this medication for 1 week and is now back to constipation with hard stools every 3 days.  He liked the Linzess and was doing well on it.   PREVIOUS GI WORKUP   Colonoscopy 02/10/2022 - Small lipoma in the sigmoid colon.  - Internal hemorrhoids. - The examination was otherwise normal. - Two 3 mm polyps in the cecum, removed with a cold snare. Resected and retrieved.  - One 3 to 4 mm polyp in the transverse colon, removed with a cold snare. Resected and retrieved. - no surveillance recommended   Diagnosis 1. Surgical [P], colon, cecum, polyp (2) FRAGMENTS OF LARGE BOWEL MUCOSA, SOME OF WHICH ARE POLYPOID, SHOWING NO SIGNIFICANT INFLAMMATION, CHRONIC CRYPT ARCHITECTURAL CHANGES, DYSPLASIA OR MALIGNANCY. RARE LYMPHOID AGGREGATE IS NOTED. 2. Surgical [P], colon, transverse, polyp (1) FINDINGS CONSISTENT WITH TUBULAR ADENOMA. NO  HIGH-GRADE DYSPLASIA OR MALIGNANCY IS SEEN. CLINICAL AND ENDOSCOPIC CORRELATION IS REQUIRED.   Colonoscopy 02/23/19 - The perianal and digital rectal examinations were normal. - Two sessile polyps were found in the cecum. The polyps were diminutive in size. These polyps were removed with a cold biopsy forceps. Resection and retrieval were complete. - Two sessile polyps were found in the transverse colon. The polyps were 3 mm in size. These polyps were removed with a cold snare. Resection and retrieval were complete. - A 3 mm polyp was found in the descending colon. The polyp was sessile. The polyp was removed with a cold snare. Resection and retrieval were complete. - A 3 mm polyp was found in the sigmoid colon. The polyp was sessile. The polyp was removed with a cold snare. Resection and retrieval were complete. - There was a small lipoma, in the sigmoid colon. - Internal hemorrhoids were found. - The prep was fair on insertion. Several minutes spent lavaging the colon to achieve adequate views. The exam was otherwise without abnormality.   Surgical [P], cecum (cold forcep), transverse, descending and sigmoid, polyp (5) - TUBULAR ADENOMA(S). - INFLAMMATORY POLYP. - NO HIGH GRADE DYSPLASIA OR MALIGNANCY.   Recommended repeat in 3 years   Colonoscopy 01/04/16 - The perianal and digital rectal examinations were normal. - A 4 mm polyp was found in the ascending colon. The polyp was sessile. The polyp was removed with a cold snare. Resection and retrieval were complete. - A 5 mm polyp was found in the transverse colon. The polyp was sessile. The polyp was removed with a cold snare. Resection and retrieval were complete. -  A 4 mm polyp was found in the sigmoid colon. The polyp was sessile. The polyp was removed with a cold snare. Resection and retrieval were complete. - The terminal ileum appeared normal. - Non-bleeding internal hemorrhoids were found during retroflexion. The hemorrhoids  were small. - The exam was otherwise without abnormality. No abnormality noted which could cause the patient's changes in bowel symptoms, which perhaps were due to change in medications (narcotics)     Colonoscopy 11/05/2011 - lipoma, hyperplastic polyps Colonoscopy 09/2007 - 1cm polyp - tubulovillous adenoma   Nuclear stress 08/21/17 - normal exam, EF 47%    EGD 02/27/21: - Very mild focal / healing esophagitis was found 39 cm from the incisors. - The exam of the esophagus was otherwise normal. No Barrett's esophagus - The entire examined stomach was normal. - The duodenal bulb and second portion of the duodenum were normal.     Echo 11/13/21: EF 55-60%, valves okay  Past Medical History:  Diagnosis Date   A-fib (HCC) with spontaneous convert to NSR 08/2012   Adenomatous colon polyp 09/2007   Anxiety    Cardiac CT on 5/11, calcium score 0    Cataracts, bilateral    DDD (degenerative disc disease)    ED (erectile dysfunction)    Fibromyalgia    Generalized OA    GERD (gastroesophageal reflux disease)    Myalgia and myositis    OSA (obstructive sleep apnea), compliant with CPAP    Sleep apnea    cpap   Vitamin D deficiency     Past Surgical History:  Procedure Laterality Date   CATARACT EXTRACTION Right 02/2019   CHONDROPLASTY Left    COLONOSCOPY     LIPOMA EXCISION     LUMBAR LAMINECTOMY     POLYPECTOMY     ROTATOR CUFF REPAIR Right    UPPER GASTROINTESTINAL ENDOSCOPY      Current Outpatient Medications  Medication Sig Dispense Refill   aspirin 81 MG chewable tablet Chew by mouth.     azithromycin (ZITHROMAX) 250 MG tablet Take 2 tabs day 1, then 1 tab daily 6 each 0   Ca Carbonate-Mag Hydroxide (ROLAIDS PO) Take by mouth as needed.     Cholecalciferol (VITAMIN D) 125 MCG (5000 UT) CAPS Take 1 capsule by mouth daily.     Coenzyme Q10 (COQ10) 100 MG CAPS Take by mouth.     fluticasone (FLOVENT HFA) 220 MCG/ACT inhaler      linaclotide (LINZESS) 145 MCG CAPS  capsule Take 1 capsule (145 mcg total) by mouth daily before breakfast. Take 30 minutes before meal 12 capsule 0   meloxicam (MOBIC) 15 MG tablet Take 1 tablet (15 mg total) by mouth daily. 30 tablet 0   mineral oil liquid Take 15 mLs by mouth at bedtime as needed for moderate constipation.     Multiple Vitamin (MULTIVITAMIN WITH MINERALS) TABS tablet Take 1 tablet by mouth daily.     pantoprazole (PROTONIX) 40 MG tablet Take 1 tablet (40 mg total) by mouth daily. 30 tablet 2   promethazine-dextromethorphan (PROMETHAZINE-DM) 6.25-15 MG/5ML syrup Take 5 mLs by mouth 4 (four) times daily as needed. 118 mL 0   tadalafil (CIALIS) 20 MG tablet Take 1 tablet (20 mg total) by mouth every other day. 30 tablet 5   Zinc 30 MG CAPS Take by mouth.     No current facility-administered medications for this visit.    Allergies as of 12/15/2023 - Review Complete 11/24/2023  Allergen Reaction Noted   Codeine  Itching    Hydrocodone Itching    Omeprazole Itching 01/07/2022   Penicillins Hives and Itching     Family History  Problem Relation Age of Onset   Breast cancer Mother    Diabetes Maternal Aunt    Heart attack Father 13   Colon cancer Neg Hx    Colon polyps Neg Hx    Rectal cancer Neg Hx    Stomach cancer Neg Hx    Esophageal cancer Neg Hx     Social History   Socioeconomic History   Marital status: Married    Spouse name: Not on file   Number of children: 4   Years of education: Not on file   Highest education level: Associate degree: occupational, Scientist, product/process development, or vocational program  Occupational History   Occupation: Retired    Comment: PT @ [employer]  Tobacco Use   Smoking status: Former    Current packs/day: 0.00    Average packs/day: 1.5 packs/day for 30.0 years (45.0 ttl pk-yrs)    Types: Cigarettes    Start date: 09/08/1964    Quit date: 09/08/1994    Years since quitting: 29.2   Smokeless tobacco: Never   Tobacco comments:    Quit smoking 1996, smoked for 30 years, pt smoke 3  packs per day for 1-2 years.   Vaping Use   Vaping status: Never Used  Substance and Sexual Activity   Alcohol use: Yes    Alcohol/week: 3.0 standard drinks of alcohol    Types: 3 Cans of beer per week    Comment: occ beer on weekends   Drug use: No   Sexual activity: Not on file  Other Topics Concern   Not on file  Social History Narrative   Patient disabled due to multiple orthopedic problems.   Social Drivers of Corporate investment banker Strain: Low Risk  (11/23/2023)   Overall Financial Resource Strain (CARDIA)    Difficulty of Paying Living Expenses: Not hard at all  Food Insecurity: No Food Insecurity (11/23/2023)   Hunger Vital Sign    Worried About Running Out of Food in the Last Year: Never true    Ran Out of Food in the Last Year: Never true  Transportation Needs: No Transportation Needs (11/23/2023)   PRAPARE - Administrator, Civil Service (Medical): No    Lack of Transportation (Non-Medical): No  Physical Activity: Inactive (11/23/2023)   Exercise Vital Sign    Days of Exercise per Week: 2 days    Minutes of Exercise per Session: 0 min  Stress: No Stress Concern Present (11/23/2023)   [employer] of Occupational Health - Occupational Stress Questionnaire    Feeling of Stress : Not at all  Social Connections: Socially Integrated (11/23/2023)   Social Connection and Isolation Panel [NHANES]    Frequency of Communication with Friends and Family: More than three times a week    Frequency of Social Gatherings with Friends and Family: Three times a week    Attends Religious Services: More than 4 times per year    Active Member of Clubs or Organizations: Yes    Attends Banker Meetings: More than 4 times per year    Marital Status: Married  Catering manager Violence: Not At Risk (09/14/2023)   Humiliation, Afraid, Rape, and Kick questionnaire    Fear of Current or Ex-Partner: No    Emotionally Abused: No    Physically Abused: No     Sexually Abused: No  Review of Systems:    Constitutional: No weight loss, fever, chills, weakness or fatigue HEENT: Eyes: No change in vision               Ears, Nose, Throat:  No change in hearing or congestion Skin: No rash or itching Cardiovascular: No chest pain, chest pressure or palpitations   Respiratory: No SOB or cough Gastrointestinal: See HPI and otherwise negative Genitourinary: No dysuria or change in urinary frequency Neurological: No headache, dizziness or syncope Musculoskeletal: No new muscle or joint pain Hematologic: No bleeding or bruising Psychiatric: No history of depression or anxiety    Physical Exam:  Vital signs: There were no vitals taken for this visit.  Constitutional: NAD, Well developed, Well nourished, alert and cooperative Head:  Normocephalic and atraumatic. Eyes:   PEERL, EOMI. No icterus. Conjunctiva pink. Respiratory: Respirations even and unlabored. Lungs clear to auscultation bilaterally.   No wheezes, crackles, or rhonchi.  Cardiovascular:  Regular rate and rhythm. No peripheral edema, cyanosis or pallor.  Gastrointestinal:  Soft, nondistended, nontender. No rebound or guarding. Normal bowel sounds. No appreciable masses or hepatomegaly. Rectal:  Not performed.  Msk:  Symmetrical without gross deformities. Without edema, no deformity or joint abnormality.  Neurologic:  Alert and  oriented x4;  grossly normal neurologically.  Skin:   Dry and intact without significant lesions or rashes. Psychiatric: Oriented to person, place and time. Demonstrates good judgement and reason without abnormal affect or behaviors.  Physical Exam    RELEVANT LABS AND IMAGING: CBC    Component Value Date/Time   WBC 7.2 05/19/2022 1041   WBC 8.5 02/21/2019 1516   RBC 5.38 05/19/2022 1041   RBC 4.81 02/21/2019 1516   HGB 15.8 05/19/2022 1041   HCT 48.5 05/19/2022 1041   PLT 192 05/19/2022 1041   MCV 90 05/19/2022 1041   MCH 29.4 05/19/2022 1041    MCH 30.0 09/10/2017 2010   MCHC 32.6 05/19/2022 1041   MCHC 33.4 02/21/2019 1516   RDW 13.2 05/19/2022 1041   LYMPHSABS 1.9 02/21/2019 1516   MONOABS 0.6 02/21/2019 1516   EOSABS 0.2 02/21/2019 1516   BASOSABS 0.1 02/21/2019 1516    CMP     Component Value Date/Time   NA 141 05/19/2022 1041   K 4.6 05/19/2022 1041   CL 103 05/19/2022 1041   CO2 25 05/19/2022 1041   GLUCOSE 93 05/19/2022 1041   GLUCOSE 93 02/21/2019 1516   BUN 17 05/19/2022 1041   CREATININE 0.97 05/19/2022 1041   CALCIUM 8.6 05/19/2022 1041   PROT 6.7 02/21/2019 1516   ALBUMIN 4.2 02/21/2019 1516   AST 16 02/21/2019 1516   ALT 18 02/21/2019 1516   ALKPHOS 63 02/21/2019 1516   BILITOT 0.5 02/21/2019 1516   GFRNONAA >60 09/10/2017 2010   GFRAA >60 09/10/2017 2010     Assessment/Plan:   GERD Well-controlled on pantoprazole 40 Mg once daily.  Rare breakthrough symptoms exacerbated by trigger foods such as pizza.  No current NSAID use.  No evidence of Barrett's esophagus on previous endoscopy. - Continue pantoprazole 40 Mg once daily - Continue lifestyle modifications - If worsening breakthrough symptoms please let us know  IBS-C Previous failure of MiraLAX, fiber, enemas, laxatives, stool softeners.  Up-to-date on colonoscopy.  Normal thyroid.  Improved on Linzess 145 mcg, though did note occasional loose stools and wonders about lower dose. - Provided samples of Linzess 72 mcg for trial - Send in Linzess 72 mcg 30 days with 2 refills -  Encourage use of squatty potty to aid in bowel movements - Follow-up 8 to 12 weeks    This visit required 32 minutes of patient care (this includes precharting, chart review, review of results, face-to-face time used for counseling as well as treatment plan and follow-up. The patient was provided an opportunity to ask questions and all were answered. The patient agreed with the plan and demonstrated an understanding of the instructions.   Lara Mulch [place]  [facility] 12/14/2023, 8:32 PM  Cc: Ardith Dark, MD

## 2023-12-15 ENCOUNTER — Ambulatory Visit: Payer: Medicare Other | Admitting: Gastroenterology

## 2023-12-15 ENCOUNTER — Encounter: Payer: Self-pay | Admitting: Gastroenterology

## 2023-12-15 VITALS — BP 102/68 | HR 63 | Ht 68.0 in | Wt 230.2 lb

## 2023-12-15 DIAGNOSIS — K581 Irritable bowel syndrome with constipation: Secondary | ICD-10-CM

## 2023-12-15 DIAGNOSIS — K219 Gastro-esophageal reflux disease without esophagitis: Secondary | ICD-10-CM | POA: Diagnosis not present

## 2023-12-15 MED ORDER — LINACLOTIDE 72 MCG PO CAPS: 72.0000 ug | ORAL_CAPSULE | Freq: Every day | ORAL | 2 refills | Status: DC

## 2023-12-15 NOTE — Progress Notes (Signed)
 Agree with assessment and plan as outlined.

## 2023-12-15 NOTE — Patient Instructions (Signed)
 We have sent the following medications to your pharmacy for you to pick up at your convenience: Linzess daily  _______________________________________________________  If your blood pressure at your visit was 140/90 or greater, please contact your primary care physician to follow up on this.  _______________________________________________________  If you are age 77 or older, your body mass index should be between 23-30. Your Body mass index is 35 kg/m. If this is out of the aforementioned range listed, please consider follow up with your Primary Care Provider.  If you are age 75 or younger, your body mass index should be between 19-25. Your Body mass index is 35 kg/m. If this is out of the aformentioned range listed, please consider follow up with your Primary Care Provider.   ________________________________________________________  The [facility] providers would like to encourage you to use [facility] to communicate with providers for non-urgent requests or questions.  Due to long hold times on the telephone, sending your provider a message by [facility] may be a faster and more efficient way to get a response.  Please allow 48 business hours for a response.  Please remember that this is for non-urgent requests.  _______________________________________________________  Thank you for trusting me with your gastrointestinal care!   Boone Master, PA

## 2023-12-29 ENCOUNTER — Other Ambulatory Visit: Payer: Self-pay | Admitting: Family Medicine

## 2023-12-29 ENCOUNTER — Telehealth: Payer: Self-pay | Admitting: Gastroenterology

## 2023-12-29 NOTE — Telephone Encounter (Signed)
 Patient wife called and stated that her husband and her can not afford to pay 400 dollars a month for his linzess . Patient wife is wanting to know if there is a different medication that would work as the linzess  and that is affordable. Patient wife is requesting a call back. Please advise.

## 2023-12-29 NOTE — Telephone Encounter (Signed)
 Called and spoke to patient's wife. Patient has tried a lot of over-the-counter medications that didn't work.(see OV from 12-15-23).  Linzess  samples worked but its too expensive. I encouraged them to call his insurance company and ask which medication is on his formulary and would be covered the best.  They will send a MyChart message to [facility] when they have an alternative to see if she would send a prescription if appropriate.

## 2023-12-30 MED ORDER — MELOXICAM 15 MG PO TABS: 15.0000 mg | ORAL_TABLET | Freq: Every day | ORAL | 0 refills | Status: DC

## 2024-02-23 ENCOUNTER — Ambulatory Visit: Admitting: Gastroenterology

## 2024-05-11 ENCOUNTER — Telehealth: Payer: Self-pay | Admitting: *Deleted

## 2024-05-11 NOTE — Telephone Encounter (Signed)
 Copied from CRM #[id number]. Topic: General - Other >> May 11, 2024  9:41 AM Henretta I wrote: Reason for CRM: Patients wife called because he is having pain in wrist and possible gout. NT was offered but wife did not want to speak to them and stated patient just wanted to be seen and she just wanted to get him scheduled so an appt was scheduled for tomorrow 9/4 at 8:20AM   Patient has an OV tomorrow with PCP  Elora KRAFT

## 2024-05-12 ENCOUNTER — Ambulatory Visit (INDEPENDENT_AMBULATORY_CARE_PROVIDER_SITE_OTHER)

## 2024-05-12 ENCOUNTER — Ambulatory Visit: Admitting: Family Medicine

## 2024-05-12 ENCOUNTER — Encounter: Payer: Self-pay | Admitting: Family Medicine

## 2024-05-12 VITALS — BP 107/69 | HR 59 | Temp 97.0°F | Ht 68.0 in | Wt 228.0 lb

## 2024-05-12 DIAGNOSIS — M79645 Pain in left finger(s): Secondary | ICD-10-CM | POA: Diagnosis not present

## 2024-05-12 DIAGNOSIS — M19042 Primary osteoarthritis, left hand: Secondary | ICD-10-CM | POA: Diagnosis not present

## 2024-05-12 DIAGNOSIS — M1991 Primary osteoarthritis, unspecified site: Secondary | ICD-10-CM | POA: Diagnosis not present

## 2024-05-12 DIAGNOSIS — I10 Essential (primary) hypertension: Secondary | ICD-10-CM

## 2024-05-12 MED ORDER — PREDNISONE 50 MG PO TABS
ORAL_TABLET | ORAL | 0 refills | Status: DC
Start: 2024-05-12 — End: 2024-07-18

## 2024-05-12 NOTE — Assessment & Plan Note (Signed)
 At goal today without meds.

## 2024-05-12 NOTE — Assessment & Plan Note (Signed)
 Pain in thumb likely secondary to osteoarthritis flare.  Previously has been on meloxicam  for back and leg pain however will starting prednisone  burst as above.

## 2024-05-12 NOTE — Progress Notes (Addendum)
   Joseph Mcdaniel is a 78 y.o. male who presents today for an office visit.  Assessment/Plan:  New/Acute Problems: Left Thumb Pain  Likely secondary to osteoarthritis flare however may have component of tendinitis.  Also considered gout however current presentation is not typical of gout and has never had this previously diagnosed in the past.  We did discuss treatment options.  We will start prednisone  burst.  Given that symptoms been present for the last month we will also check plain film today.  Recommended ice and relative rest.  He will follow-up with us  next week.  Consider referral to sports medicine if not improving depending on results of his x-ray.  We discussed reasons to return to care sooner.  Assessment and Plan    Chronic Problems Addressed Today: Essential hypertension At goal today without meds.  Osteoarthritis Pain in thumb likely secondary to osteoarthritis flare.  Previously has been on meloxicam  for back and leg pain however will starting prednisone  burst as above.     Subjective:  HPI:  See assessment / plan for status of chronic conditions.   Discussed the use of AI scribe software for clinical note transcription with the patient, who gave verbal consent to proceed.  History of Present Illness Joseph Mcdaniel is a 78 year old male who presents with left wrist pain and swelling.  He has experienced swelling in his left wrist for about a month, with severe pain, especially when moving the wrist side to side or attempting to pick things up. There is no history of specific injury or trauma to the wrist, as he states he 'just woke up that way.'  He has tried taking ibuprofen  for a day or two without relief. The pain is localized to the joint at the base of the thumb and is exacerbated by movement, such as making a thumbs up gesture or closing his fist. Initially, the wrist was 'real hot for two or three days.'  The pain and swelling have remained about the  same over the past month, with occasional worsening. He experiences a popping sensation in the joint, which he can hear, and describes the pain as being so severe that he sometimes drops objects.  He has a history of suspected gout, although it was never officially confirmed, and he has never experienced gout in his feet. 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  He is not currently taking any medications for this issue other than the previously mentioned ibuprofen .         Objective:  Physical Exam: BP 107/69   Pulse (!) 59   Temp (!) 97 F (36.1 C) (Temporal)   Ht 5' 8 (1.727 m)   Wt 228 lb (103.4 kg)   SpO2 98%   BMI 34.67 kg/m   Gen: No acute distress, resting comfortably MUSCULOSKELETAL: Bony hypertrophy noted in hand.  Pain elicited with palpation of first MCP joint.  Also pain with abduction and adduction of left thumb.  Neurovascular intact distally.  Terrilee deferred due to degree of pain. Neuro: Grossly normal, moves all extremities Psych: Normal affect and thought content      Jodine Muchmore M. Kennyth, MD 05/20/2024 7:34 AM

## 2024-05-12 NOTE — Patient Instructions (Signed)
 It was very nice to see you today!  VISIT SUMMARY: During your visit, we discussed the pain and swelling in your left wrist and thumb that you've been experiencing for the past month. We suspect arthritis flare, tendinitis, or possibly gout as the cause of your symptoms.  YOUR PLAN: ou have been experiencing chronic pain and swelling in your left wrist and thumb for one month, which suggests an arthritis flare, tendinitis, or possibly gout. -We will order an x-ray of your left wrist and thumb to check for arthritis, bone spurs, or other abnormalities. -You are prescribed prednisone  to help with inflammation and pain. Be aware of potential side effects like increased appetite. -Use ice packs on your wrist to help reduce swelling and pain. -If your symptoms persist or worsen, we may refer you to a hand specialist or orthopedist. -Please send a follow-up message via MyChart next week to let us  know how you are responding to the treatment.  Return if symptoms worsen or fail to improve.   Take care, Dr Kennyth  PLEASE NOTE:  If you had any lab tests, please let us  know if you have not heard back within a few days. You may see your results on mychart before we have a chance to review them but we will give you a call once they are reviewed by us .   If we ordered any referrals today, please let us  know if you have not heard from their office within the next week.   If you had any urgent prescriptions sent in today, please check with the pharmacy within an hour of our visit to make sure the prescription was transmitted appropriately.   Please try these tips to maintain a healthy lifestyle:  Eat at least 3 REAL meals and 1-2 snacks per day.  Aim for no more than 5 hours between eating.  If you eat breakfast, please do so within one hour of getting up.   Each meal should contain half fruits/vegetables, one quarter protein, and one quarter carbs (no bigger than a computer mouse)  Cut down on sweet  beverages. This includes juice, soda, and sweet tea.   Drink at least 1 glass of water with each meal and aim for at least 8 glasses per day  Exercise at least 150 minutes every week.

## 2024-05-13 ENCOUNTER — Encounter: Payer: Self-pay | Admitting: Family Medicine

## 2024-05-13 ENCOUNTER — Telehealth: Payer: Self-pay | Admitting: *Deleted

## 2024-05-13 NOTE — Telephone Encounter (Signed)
 Copied from CRM [id number]. Topic: Clinical - Lab/Test Results >> May 13, 2024 10:40 AM Vena HERO wrote: Reason for CRM: pt called in to find out results of xray. Please advise via mychart or callback

## 2024-05-13 NOTE — Telephone Encounter (Signed)
 Left message with wife, results are not in yet  I will give him a call back once resuls are in

## 2024-05-16 NOTE — Telephone Encounter (Signed)
 See message.

## 2024-05-17 NOTE — Telephone Encounter (Signed)
 The radiologist has not read it yet. Looks like he has arthritis based on my read.   Can we check with the radiology department to see when it will be read?

## 2024-05-19 DIAGNOSIS — M654 Radial styloid tenosynovitis [de Quervain]: Secondary | ICD-10-CM | POA: Diagnosis not present

## 2024-05-20 ENCOUNTER — Ambulatory Visit: Payer: Self-pay | Admitting: Family Medicine

## 2024-05-20 NOTE — Progress Notes (Signed)
 X-ray confirms moderate osteoarthritis in his thumb.  He should let us  know if his symptoms are not improving and we can refer to sports medicine.

## 2024-05-20 NOTE — Telephone Encounter (Signed)
 See results note.

## 2024-06-10 DIAGNOSIS — M654 Radial styloid tenosynovitis [de Quervain]: Secondary | ICD-10-CM | POA: Diagnosis not present

## 2024-07-15 ENCOUNTER — Telehealth: Admitting: Physician Assistant

## 2024-07-15 ENCOUNTER — Ambulatory Visit: Payer: Self-pay

## 2024-07-15 DIAGNOSIS — B9689 Other specified bacterial agents as the cause of diseases classified elsewhere: Secondary | ICD-10-CM | POA: Diagnosis not present

## 2024-07-15 DIAGNOSIS — R051 Acute cough: Secondary | ICD-10-CM | POA: Diagnosis not present

## 2024-07-15 DIAGNOSIS — J019 Acute sinusitis, unspecified: Secondary | ICD-10-CM

## 2024-07-15 MED ORDER — BENZONATATE 100 MG PO CAPS: 100.0000 mg | ORAL_CAPSULE | Freq: Three times a day (TID) | ORAL | 0 refills | Status: DC | PRN

## 2024-07-15 MED ORDER — DOXYCYCLINE HYCLATE 100 MG PO TABS: 100.0000 mg | ORAL_TABLET | Freq: Two times a day (BID) | ORAL | 0 refills | Status: DC

## 2024-07-15 MED ORDER — FLUTICASONE PROPIONATE 50 MCG/ACT NA SUSP: 2.0000 | Freq: Every day | NASAL | 0 refills | Status: AC

## 2024-07-15 NOTE — Telephone Encounter (Signed)
 FYI Only or Action Required?: FYI only for provider: appointment scheduled on MyChart scheduled for this afternoon  and ov on Monday.  Patient was last seen in primary care on 05/12/2024 by Kennyth Worth HERO, MD.  Called Nurse Triage reporting Cough - fever - Pt has antibiotic allergy.   Symptoms began Unsure.  Interventions attempted: Unsure.  Symptoms are: gradually worsening.  Triage Disposition: See Physician Within 24 Hours  Patient/caregiver understands and will follow disposition?: Yes                        Called again - s/w wife - wife reports sinus infection, Cough with dark yellow phlegm.  Call answered by Mrs. Ruybal. Introduced myself and line disconnected.  Call disconnected prior to transfer.      Copied from CRM #[id number]. Topic: Clinical - Red Word Triage >> Jul 15, 2024  8:03 AM Victoria A wrote: [facility] that prompted transfer to Nurse Triage: Patient's wife called said patient is have body aches and fever. Patient's wife called Agent Dump because Agent could not hear wife due to the bad connection due to area of where patient lives. Reason for Disposition  Fever present > 3 days (72 hours)  Answer Assessment - Initial Assessment Questions S/w wife. Pt has a cough and fever. They are out of town. She is concerned about a sinus infection. Pt is coughing up dark yellow phlegm. Wanted to see pcp on Monday. Scheduled both MyChart visit for today and OV on Monday.      1. ONSET: When did the cough begin?      Unsure 2. SEVERITY: How bad is the cough today?      moderate 3. SPUTUM: Describe the color of your sputum (e.g., none, dry cough; clear, white, yellow, green)     Dark yellow 4. HEMOPTYSIS: Are you coughing up any blood? If Yes, ask: How much? (e.g., flecks, streaks, tablespoons, etc.)     Did not ask 5. DIFFICULTY BREATHING: Are you having difficulty breathing? If Yes, ask: How bad is it? (e.g., mild, moderate,  severe)      Did not ask 6. FEVER: Do you have a fever? If Yes, ask: What is your temperature, how was it measured, and when did it start?     Was told yes 10. OTHER SYMPTOMS: Do you have any other symptoms? (e.g., runny nose, wheezing, chest pain)       Was told sinus infection  Protocols used: Cough - Acute Productive-A-AH

## 2024-07-15 NOTE — Telephone Encounter (Signed)
 Appt today

## 2024-07-15 NOTE — Patient Instructions (Signed)
 Joseph Mcdaniel, thank you for joining [name], PA-C for today's virtual visit.  While this provider is not your primary care provider (PCP), if your PCP is located in our provider database this encounter information will be shared with them immediately following your visit.   A [facility] MyChart account gives you access to today's visit and all your visits, tests, and labs performed at [facility]  click here if you don't have a [facility] MyChart account or go to [email or web address]  Consent: (Patient) Joseph Mcdaniel provided verbal consent for this virtual visit at the beginning of the encounter.  Current Medications:  Current Outpatient Medications:    benzonatate  (TESSALON ) 100 MG capsule, Take 1 capsule (100 mg total) by mouth 3 (three) times daily as needed., Disp: 30 capsule, Rfl: 0   doxycycline  (VIBRA -TABS) 100 MG tablet, Take 1 tablet (100 mg total) by mouth 2 (two) times daily., Disp: 14 tablet, Rfl: 0   fluticasone (FLONASE) 50 MCG/ACT nasal spray, Place 2 sprays into both nostrils daily., Disp: 16 g, Rfl: 0   aspirin  81 MG chewable tablet, Chew 81 mg by mouth daily., Disp: , Rfl:    Ca Carbonate-Mag Hydroxide (ROLAIDS PO), Take by mouth as needed., Disp: , Rfl:    Cholecalciferol (VITAMIN D) 125 MCG (5000 UT) CAPS, Take 1 capsule by mouth daily., Disp: , Rfl:    Coenzyme Q10 (COQ10) 100 MG CAPS, Take 100 mg by mouth daily., Disp: , Rfl:    fluticasone (FLOVENT  HFA) 220 MCG/ACT inhaler, Inhale 1 puff into the lungs as needed., Disp: , Rfl:    linaclotide  (LINZESS ) 145 MCG CAPS capsule, Take 1 capsule (145 mcg total) by mouth daily before breakfast. Take 30 minutes before meal, Disp: 12 capsule, Rfl: 0   linaclotide  (LINZESS ) 72 MCG capsule, Take 1 capsule (72 mcg total) by mouth daily before breakfast., Disp: 30 capsule, Rfl: 2   meloxicam  (MOBIC ) 15 MG tablet, Take 1 tablet (15 mg total) by mouth daily., Disp: 30 tablet, Rfl: 0   mineral oil  liquid, Take 15 mLs by mouth at bedtime as needed for moderate constipation., Disp: , Rfl:    Multiple Vitamin (MULTIVITAMIN WITH MINERALS) TABS tablet, Take 1 tablet by mouth daily., Disp: , Rfl:    pantoprazole  (PROTONIX ) 40 MG tablet, Take 1 tablet (40 mg total) by mouth daily., Disp: 30 tablet, Rfl: 2   predniSONE  (DELTASONE ) 50 MG tablet, Take 1 tablet daily for 5 days. Then take 1/2 tablet daily for 2 days., Disp: 6 tablet, Rfl: 0   Psyllium 30.9 % POWD, as directed Orally, Disp: , Rfl:    tadalafil  (CIALIS ) 20 MG tablet, Take 1 tablet (20 mg total) by mouth every other day., Disp: 30 tablet, Rfl: 5   Zinc 30 MG CAPS, Take 30 mg by mouth 2 (two) times a week., Disp: , Rfl:    Medications ordered in this encounter:  Meds ordered this encounter  Medications   doxycycline  (VIBRA -TABS) 100 MG tablet    Sig: Take 1 tablet (100 mg total) by mouth 2 (two) times daily.    Dispense:  14 tablet    Refill:  0    Supervising Provider:   LAMPTEY, PHILIP O [[id number]]   fluticasone (FLONASE) 50 MCG/ACT nasal spray    Sig: Place 2 sprays into both nostrils daily.    Dispense:  16 g    Refill:  0    Supervising Provider:   BLAISE ALEENE KIDD [id number]   benzonatate  (  TESSALON ) 100 MG capsule    Sig: Take 1 capsule (100 mg total) by mouth 3 (three) times daily as needed.    Dispense:  30 capsule    Refill:  0    Supervising Provider:   BLAISE ALEENE KIDD [[id number]]     *If you need refills on other medications prior to your next appointment, please contact your pharmacy*  Follow-Up: Call back or seek an in-person evaluation if the symptoms worsen or if the condition fails to improve as anticipated.  [facility] [phone]  Other Instructions  Sinus Infection, Adult A sinus infection, also called sinusitis, is inflammation of your sinuses. Sinuses are hollow spaces in the bones around your face. Your sinuses are located: Around your eyes. In the middle of your forehead. Behind  your nose. In your cheekbones. Mucus normally drains out of your sinuses. When your nasal tissues become inflamed or swollen, mucus can become trapped or blocked. This allows bacteria, viruses, and fungi to grow, which leads to infection. Most infections of the sinuses are caused by a virus. A sinus infection can develop quickly. It can last for up to 4 weeks (acute) or for more than 12 weeks (chronic). A sinus infection often develops after a cold. What are the causes? This condition is caused by anything that creates swelling in the sinuses or stops mucus from draining. This includes: Allergies. Asthma. Infection from bacteria or viruses. Deformities or blockages in your nose or sinuses. Abnormal growths in the nose (nasal polyps). Pollutants, such as chemicals or irritants in the air. Infection from fungi. This is rare. What increases the risk? You are more likely to develop this condition if you: Have a weak body defense system (immune system). Do a lot of swimming or diving. Overuse nasal sprays. Smoke. What are the signs or symptoms? The main symptoms of this condition are pain and a feeling of pressure around the affected sinuses. Other symptoms include: Stuffy nose or congestion that makes it difficult to breathe through your nose. Thick yellow or greenish drainage from your nose. Tenderness, swelling, and warmth over the affected sinuses. A cough that may get worse at night. Decreased sense of smell and taste. Extra mucus that collects in the throat or the back of the nose (postnasal drip) causing a sore throat or bad breath. Tiredness (fatigue). Fever. How is this diagnosed? This condition is diagnosed based on: Your symptoms. Your medical history. A physical exam. Tests to find out if your condition is acute or chronic. This may include: Checking your nose for nasal polyps. Viewing your sinuses using a device that has a light (endoscope). Testing for allergies or  bacteria. Imaging tests, such as an MRI or CT scan. In rare cases, a bone biopsy may be done to rule out more serious types of fungal sinus disease. How is this treated? Treatment for a sinus infection depends on the cause and whether your condition is chronic or acute. If caused by a virus, your symptoms should go away on their own within 10 days. You may be given medicines to relieve symptoms. They include: Medicines that shrink swollen nasal passages (decongestants). A spray that eases inflammation of the nostrils (topical intranasal corticosteroids). Rinses that help get rid of thick mucus in your nose (nasal saline washes). Medicines that treat allergies (antihistamines). Over-the-counter pain relievers. If caused by bacteria, your health care provider may recommend waiting to see if your symptoms improve. Most bacterial infections will get better without antibiotic medicine. You  may be given antibiotics if you have: A severe infection. A weak immune system. If caused by narrow nasal passages or nasal polyps, surgery may be needed. Follow these instructions at home: Medicines Take, use, or apply over-the-counter and prescription medicines only as told by your health care provider. These may include nasal sprays. If you were prescribed an antibiotic medicine, take it as told by your health care provider. Do not stop taking the antibiotic even if you start to feel better. Hydrate and humidify  Drink enough fluid to keep your urine pale yellow. Staying hydrated will help to thin your mucus. Use a cool mist humidifier to keep the humidity level in your home above 50%. Inhale steam for 10-15 minutes, 3-4 times a day, or as told by your health care provider. You can do this in the bathroom while a hot shower is running. Limit your exposure to cool or dry air. Rest Rest as much as possible. Sleep with your head raised (elevated). Make sure you get enough sleep each night. General  instructions  Apply a warm, moist washcloth to your face 3-4 times a day or as told by your health care provider. This will help with discomfort. Use nasal saline washes as often as told by your health care provider. Wash your hands often with soap and water to reduce your exposure to germs. If soap and water are not available, use hand sanitizer. Do not smoke. Avoid being around people who are smoking (secondhand smoke). Keep all follow-up visits. This is important. Contact a health care provider if: You have a fever. Your symptoms get worse. Your symptoms do not improve within 10 days. Get help right away if: You have a severe headache. You have persistent vomiting. You have severe pain or swelling around your face or eyes. You have vision problems. You develop confusion. Your neck is stiff. You have trouble breathing. These symptoms may be an emergency. Get help right away. Call 911. Do not wait to see if the symptoms will go away. Do not drive yourself to the hospital. Summary A sinus infection is soreness and inflammation of your sinuses. Sinuses are hollow spaces in the bones around your face. This condition is caused by nasal tissues that become inflamed or swollen. The swelling traps or blocks the flow of mucus. This allows bacteria, viruses, and fungi to grow, which leads to infection. If you were prescribed an antibiotic medicine, take it as told by your health care provider. Do not stop taking the antibiotic even if you start to feel better. Keep all follow-up visits. This is important. This information is not intended to replace advice given to you by your health care provider. Make sure you discuss any questions you have with your health care provider. Document Revised: 07/30/2021 Document Reviewed: 07/30/2021 Elsevier Patient Education  2024 Elsevier Inc.   If you have been instructed to have an in-person evaluation today at a local Urgent Care facility, please use the  link below. It will take you to a list of all of our available [facility], including address, phone number and hours of operation. Please do not delay care.  [facility]  If you or a family member do not have a primary care provider, use the link below to schedule a visit and establish care. When you choose a [facility] primary care physician or advanced practice provider, you gain a long-term partner in health. Find a Primary Care Provider  Learn more about [facility]'s in-office and virtual care options: [facility] - Get Care Now

## 2024-07-15 NOTE — Progress Notes (Signed)
 Virtual Visit Consent   Joseph Mcdaniel, you are scheduled for a virtual visit with a [facility] provider today. Just as with appointments in the office, your consent must be obtained to participate. Your consent will be active for this visit and any virtual visit you may have with one of our providers in the next 365 days. If you have a MyChart account, a copy of this consent can be sent to you electronically.  As this is a virtual visit, video technology does not allow for your provider to perform a traditional examination. This may limit your provider's ability to fully assess your condition. If your provider identifies any concerns that need to be evaluated in person or the need to arrange testing (such as labs, EKG, etc.), we will make arrangements to do so. Although advances in technology are sophisticated, we cannot ensure that it will always work on either your end or our end. If the connection with a video visit is poor, the visit may have to be switched to a telephone visit. With either a video or telephone visit, we are not always able to ensure that we have a secure connection.  By engaging in this virtual visit, you consent to the provision of healthcare and authorize for your insurance to be billed (if applicable) for the services provided during this visit. Depending on your insurance coverage, you may receive a charge related to this service.  I need to obtain your verbal consent now. Are you willing to proceed with your visit today? Joseph Mcdaniel has provided verbal consent on 07/15/2024 for a virtual visit (video or telephone). [name], PA-C  Date: 07/15/2024 4:11 PM   Virtual Visit via Video Note   I, [name], connected with  Joseph Mcdaniel  ([id number], 06/19/1946) on 07/15/24 at  4:00 PM EST by a video-enabled telemedicine application and verified that I am speaking with the correct person using two identifiers.  Location: Patient: Virtual Visit  Location Patient: Home Provider: Virtual Visit Location Provider: Home Office   I discussed the limitations of evaluation and management by telemedicine and the availability of in person appointments. The patient expressed understanding and agreed to proceed.    History of Present Illness: Joseph Mcdaniel is a 78 y.o. who identifies as a male who was assigned male at birth, and is being seen today for sinus congestion and cough.  HPI: URI  This is a new problem. The current episode started 1 to 4 weeks ago (over a week). The problem has been gradually worsening. The maximum temperature recorded prior to his arrival was 100.4 - 100.9 F. The fever has been present for 1 to 2 days. Associated symptoms include congestion, coughing (intermittently productive and discharge is discolored), headaches, rhinorrhea (and post nasal drainage), sinus pain and a sore throat (mild). Pertinent negatives include no ear pain, plugged ear sensation, swollen glands or wheezing. Treatments tried: claritin. The treatment provided no relief.     Problems:  Patient Active Problem List   Diagnosis Date Noted   Bradycardia 11/03/2021   Erectile dysfunction 07/11/2019   Dilated cardiomyopathy (HCC) 04/27/2018   Osteoarthritis 03/03/2018   Essential hypertension 01/05/2018   PAF (paroxysmal atrial fibrillation) (HCC) 02/13/2016   Cannabis dependence, episodic use (HCC) 08/31/2012   H/O adenomatous polyp of colon 10/29/2011   OSA on CPAP 04/03/2008    Allergies:  Allergies  Allergen Reactions   Codeine Itching   Hydrocodone Itching   Omeprazole  Itching  Reaction unknown Reaction unknown   Penicillins Hives and Itching    Has patient had a PCN reaction causing immediate rash, facial/tongue/throat swelling, SOB or lightheadedness with hypotension: Yes Has patient had a PCN reaction causing severe rash involving mucus membranes or skin necrosis: No Has patient had a PCN reaction that required hospitalization:  No Has patient had a PCN reaction occurring within the last 10 years: No If all of the above answers are NO, then may proceed with Cephalosporin use.    Medications:  Current Outpatient Medications:    benzonatate  (TESSALON ) 100 MG capsule, Take 1 capsule (100 mg total) by mouth 3 (three) times daily as needed., Disp: 30 capsule, Rfl: 0   doxycycline  (VIBRA -TABS) 100 MG tablet, Take 1 tablet (100 mg total) by mouth 2 (two) times daily., Disp: 14 tablet, Rfl: 0   fluticasone (FLONASE) 50 MCG/ACT nasal spray, Place 2 sprays into both nostrils daily., Disp: 16 g, Rfl: 0   aspirin  81 MG chewable tablet, Chew 81 mg by mouth daily., Disp: , Rfl:    Ca Carbonate-Mag Hydroxide (ROLAIDS PO), Take by mouth as needed., Disp: , Rfl:    Cholecalciferol (VITAMIN D) 125 MCG (5000 UT) CAPS, Take 1 capsule by mouth daily., Disp: , Rfl:    Coenzyme Q10 (COQ10) 100 MG CAPS, Take 100 mg by mouth daily., Disp: , Rfl:    fluticasone (FLOVENT  HFA) 220 MCG/ACT inhaler, Inhale 1 puff into the lungs as needed., Disp: , Rfl:    linaclotide  (LINZESS ) 145 MCG CAPS capsule, Take 1 capsule (145 mcg total) by mouth daily before breakfast. Take 30 minutes before meal, Disp: 12 capsule, Rfl: 0   linaclotide  (LINZESS ) 72 MCG capsule, Take 1 capsule (72 mcg total) by mouth daily before breakfast., Disp: 30 capsule, Rfl: 2   meloxicam  (MOBIC ) 15 MG tablet, Take 1 tablet (15 mg total) by mouth daily., Disp: 30 tablet, Rfl: 0   mineral oil liquid, Take 15 mLs by mouth at bedtime as needed for moderate constipation., Disp: , Rfl:    Multiple Vitamin (MULTIVITAMIN WITH MINERALS) TABS tablet, Take 1 tablet by mouth daily., Disp: , Rfl:    pantoprazole  (PROTONIX ) 40 MG tablet, Take 1 tablet (40 mg total) by mouth daily., Disp: 30 tablet, Rfl: 2   predniSONE  (DELTASONE ) 50 MG tablet, Take 1 tablet daily for 5 days. Then take 1/2 tablet daily for 2 days., Disp: 6 tablet, Rfl: 0   Psyllium 30.9 % POWD, as directed Orally, Disp: , Rfl:     tadalafil  (CIALIS ) 20 MG tablet, Take 1 tablet (20 mg total) by mouth every other day., Disp: 30 tablet, Rfl: 5   Zinc 30 MG CAPS, Take 30 mg by mouth 2 (two) times a week., Disp: , Rfl:   Observations/Objective: Patient is well-developed, well-nourished in no acute distress.  Resting comfortably at home.  Head is normocephalic, atraumatic.  No labored breathing.  Speech is clear and coherent with logical content.  Patient is alert and oriented at baseline.    Assessment and Plan: 1. Acute bacterial sinusitis (Primary) - doxycycline  (VIBRA -TABS) 100 MG tablet; Take 1 tablet (100 mg total) by mouth 2 (two) times daily.  Dispense: 14 tablet; Refill: 0 - fluticasone (FLONASE) 50 MCG/ACT nasal spray; Place 2 sprays into both nostrils daily.  Dispense: 16 g; Refill: 0  2. Acute cough - benzonatate  (TESSALON ) 100 MG capsule; Take 1 capsule (100 mg total) by mouth 3 (three) times daily as needed.  Dispense: 30 capsule; Refill: 0  -  Worsening symptoms that have not responded to OTC medications.  - Will give Doxycycline  and Flonase - Tessalon  perles added for cough - Continue allergy medications.  - Steam and humidifier can help - Stay well hydrated and get plenty of rest.  - Seek in person evaluation if no symptom improvement or if symptoms worsen   Follow Up Instructions: I discussed the assessment and treatment plan with the patient. The patient was provided an opportunity to ask questions and all were answered. The patient agreed with the plan and demonstrated an understanding of the instructions.  A copy of instructions were sent to the patient via MyChart unless otherwise noted below.    The patient was advised to call back or seek an in-person evaluation if the symptoms worsen or if the condition fails to improve as anticipated.    [name], PA-C

## 2024-07-18 ENCOUNTER — Encounter: Payer: Self-pay | Admitting: Family Medicine

## 2024-07-18 ENCOUNTER — Ambulatory Visit: Admitting: Family Medicine

## 2024-07-18 VITALS — BP 140/80 | HR 57 | Temp 97.5°F | Ht 68.0 in | Wt 230.6 lb

## 2024-07-18 DIAGNOSIS — I10 Essential (primary) hypertension: Secondary | ICD-10-CM

## 2024-07-18 DIAGNOSIS — Z13220 Encounter for screening for lipoid disorders: Secondary | ICD-10-CM

## 2024-07-18 DIAGNOSIS — M1991 Primary osteoarthritis, unspecified site: Secondary | ICD-10-CM | POA: Diagnosis not present

## 2024-07-18 DIAGNOSIS — Z131 Encounter for screening for diabetes mellitus: Secondary | ICD-10-CM

## 2024-07-18 LAB — CBC
HCT: 42.7 % (ref 39.0–52.0)
Hemoglobin: 14.2 g/dL (ref 13.0–17.0)
MCHC: 33.4 g/dL (ref 30.0–36.0)
MCV: 90.4 fl (ref 78.0–100.0)
Platelets: 166 K/uL (ref 150.0–400.0)
RBC: 4.72 Mil/uL (ref 4.22–5.81)
RDW: 14 % (ref 11.5–15.5)
WBC: 5.6 K/uL (ref 4.0–10.5)

## 2024-07-18 LAB — COMPREHENSIVE METABOLIC PANEL WITH GFR
ALT: 16 U/L (ref 0–53)
AST: 25 U/L (ref 0–37)
Albumin: 4.2 g/dL (ref 3.5–5.2)
Alkaline Phosphatase: 64 U/L (ref 39–117)
BUN: 21 mg/dL (ref 6–23)
CO2: 26 meq/L (ref 19–32)
Calcium: 8.3 mg/dL — ABNORMAL LOW (ref 8.4–10.5)
Chloride: 106 meq/L (ref 96–112)
Creatinine, Ser: 1.01 mg/dL (ref 0.40–1.50)
GFR: 71.6 mL/min (ref >60.00)
Glucose, Bld: 77 mg/dL (ref 70–99)
Potassium: 3.8 meq/L (ref 3.5–5.1)
Sodium: 142 meq/L (ref 135–145)
Total Bilirubin: 0.8 mg/dL (ref 0.2–1.2)
Total Protein: 6.7 g/dL (ref 6.0–8.3)

## 2024-07-18 LAB — TSH: TSH: 1.56 u[IU]/mL (ref 0.35–5.50)

## 2024-07-18 LAB — LIPID PANEL
Cholesterol: 168 mg/dL (ref 0–200)
HDL: 35.7 mg/dL — ABNORMAL LOW (ref >39.00)
LDL Cholesterol: 112 mg/dL — ABNORMAL HIGH (ref 0–99)
NonHDL: 132.77
Total CHOL/HDL Ratio: 5
Triglycerides: 104 mg/dL (ref 0.0–149.0)
VLDL: 20.8 mg/dL (ref 0.0–40.0)

## 2024-07-18 LAB — HEMOGLOBIN A1C: Hgb A1c MFr Bld: 5.9 % (ref 4.6–6.5)

## 2024-07-18 MED ORDER — MELOXICAM 15 MG PO TABS: 15.0000 mg | ORAL_TABLET | Freq: Every day | ORAL | 0 refills | Status: AC

## 2024-07-18 NOTE — Progress Notes (Signed)
   DAGAN HEINZ is a 78 y.o. male who presents today for an office visit.  Assessment/Plan:  New/Acute Problems: Sinusitis  Improving with doxycycline  that was started last week during his virtual visit.  He will complete course of antibiotics and let us  know if he needs any further assistance with this.  Chronic Problems Addressed Today: Osteoarthritis Stable on meloxicam  as needed.  Will refill today.  Check labs today.  Would consider referral to PT or sports medicine if symptoms are not controlled with meloxicam  as needed or if he has to take this more frequently.  Essential hypertension  mildly elevated today though typically well-controlled.  He will monitor at home and let us  know if persistently elevated.  Not currently on any medications.     Subjective:  HPI:  See assessment / plan for status of chronic conditions.   Discussed the use of AI scribe software for clinical note transcription with the patient, who gave verbal consent to proceed.  History of Present Illness THEADORE BLUNCK is a 78 year old male who presents with joint pain and concerns about meloxicam  use.  He experiences significant joint pain, which has been a recurring issue. He uses meloxicam  periodically, approximately every three months for four to five days, and has only filled the prescription once in the past year. He is concerned about the potential impact of meloxicam  on his kidneys, as he has not had a urinalysis recently.  He recently had a sinus infection for which he received doxycycline  through a telemedicine consultation. The medication has been effective in clearing up the infection.  He maintains an active lifestyle, including farming and hunting, which involves processing deer meat. He described significant joint pain and stated 'my bones are just killing me' after a busy morning.  His blood pressure was noted to be elevated during the visit, which is unusual for him as he typically has  low blood pressure and heart rate.         Objective:  Physical Exam: BP (!) 140/80   Pulse (!) 57   Temp (!) 97.5 F (36.4 C) (Temporal)   Ht 5' 8 (1.727 m)   Wt 230 lb 9.6 oz (104.6 kg)   SpO2 97%   BMI 35.06 kg/m   Gen: No acute distress, resting comfortably CV: Regular rate and rhythm with no murmurs appreciated Pulm: Normal work of breathing, clear to auscultation bilaterally with no crackles, wheezes, or rhonchi Neuro: Grossly normal, moves all extremities Psych: Normal affect and thought content      Amorie Rentz M. Kennyth, MD 07/18/2024 11:03 AM

## 2024-07-18 NOTE — Patient Instructions (Signed)
 It was very nice to see you today!  VISIT SUMMARY: During your visit, we discussed your joint pain, meloxicam  use, and recent sinus infection. We also noted an unusual elevation in your blood pressure and reviewed your general health maintenance plan.  YOUR PLAN: OSTEOARTHRITIS: You have chronic joint pain, which you manage with meloxicam . -We have refilled your meloxicam  prescription. -We ordered blood work to check your kidney function, cholesterol, and glucose levels.  ESSENTIAL HYPERTENSION: Your blood pressure was elevated today, which may be due to your recent sinus infection. -We rechecked your blood pressure before you left. -Please monitor your blood pressure at home and report if it remains high.  GENERAL HEALTH MAINTENANCE: We discussed your general health maintenance plan. -We agreed to stop PSA testing since you are over 77 years old.  Return if symptoms worsen or fail to improve.   Take care, Dr Kennyth  PLEASE NOTE:  If you had any lab tests, please let us  know if you have not heard back within a few days. You may see your results on mychart before we have a chance to review them but we will give you a call once they are reviewed by us .   If we ordered any referrals today, please let us  know if you have not heard from their office within the next week.   If you had any urgent prescriptions sent in today, please check with the pharmacy within an hour of our visit to make sure the prescription was transmitted appropriately.   Please try these tips to maintain a healthy lifestyle:  Eat at least 3 REAL meals and 1-2 snacks per day.  Aim for no more than 5 hours between eating.  If you eat breakfast, please do so within one hour of getting up.   Each meal should contain half fruits/vegetables, one quarter protein, and one quarter carbs (no bigger than a computer mouse)  Cut down on sweet beverages. This includes juice, soda, and sweet tea.   Drink at least 1 glass of  water with each meal and aim for at least 8 glasses per day  Exercise at least 150 minutes every week.

## 2024-07-18 NOTE — Assessment & Plan Note (Signed)
 Stable on meloxicam  as needed.  Will refill today.  Check labs today.  Would consider referral to PT or sports medicine if symptoms are not controlled with meloxicam  as needed or if he has to take this more frequently.

## 2024-07-18 NOTE — Assessment & Plan Note (Signed)
 mildly elevated today though typically well-controlled.  He will monitor at home and let us  know if persistently elevated.  Not currently on any medications.

## 2024-07-21 ENCOUNTER — Ambulatory Visit: Payer: Self-pay | Admitting: Family Medicine

## 2024-07-21 NOTE — Progress Notes (Signed)
 His cholesterol is a little elevated.  He would benefit from starting a statin to improve his numbers and lower risk of heart attack and stroke.  Please send Lipitor 40 mg daily if he is agreeable to start.  We should recheck again in 6 to 12 months.  A1c is a little bit elevated.  Not at the point where need to start meds but he should work on diet and exercise and we can recheck it here.  All of his other labs  Including his kidney numbers are within goal range and we can recheck again in a year or so.

## 2024-08-22 ENCOUNTER — Other Ambulatory Visit: Payer: Self-pay | Admitting: Family Medicine

## 2024-09-19 ENCOUNTER — Ambulatory Visit: Payer: Medicare Other

## 2024-09-19 VITALS — BP 110/68 | HR 68 | Temp 98.2°F | Ht 69.0 in | Wt 228.0 lb

## 2024-09-19 DIAGNOSIS — Z0000 Encounter for general adult medical examination without abnormal findings: Secondary | ICD-10-CM

## 2024-09-19 NOTE — Patient Instructions (Signed)
 [name],  Thank you for taking the time for your Medicare Wellness Visit. I appreciate your continued commitment to your health goals. Please review the care plan we discussed, and feel free to reach out if I can assist you further.  Please note that Annual Wellness Visits do not include a physical exam. Some assessments may be limited, especially if the visit was conducted virtually. If needed, we may recommend an in-person follow-up with your provider.  Ongoing Care Seeing your primary care provider every 3 to 6 months helps us  monitor your health and provide consistent, personalized care.   Referrals If a referral was made during today's visit and you haven't received any updates within two weeks, please contact the referred provider directly to check on the status.  Recommended Screenings:  Health Maintenance  Topic Date Due   Hepatitis C Screening  Never done   Flu Shot  12/06/2024*   Medicare Annual Wellness Visit  09/19/2025   DTaP/Tdap/Td vaccine (2 - Td or Tdap) 11/06/2027   Pneumococcal Vaccine for age over 60  Completed   Zoster (Shingles) Vaccine  Completed   Meningitis B Vaccine  Aged Out   Colon Cancer Screening  Discontinued   COVID-19 Vaccine  Discontinued  *Topic was postponed. The date shown is not the original due date.       09/14/2023    1:50 PM  Advanced Directives  Does Patient Have a Medical Advance Directive? No  Would patient like information on creating a medical advance directive? No - Patient declined    Vision: Annual vision screenings are recommended for early detection of glaucoma, cataracts, and diabetic retinopathy. These exams can also reveal signs of chronic conditions such as diabetes and high blood pressure.  Dental: Annual dental screenings help detect early signs of oral cancer, gum disease, and other conditions linked to overall health, including heart disease and diabetes.  Please see the attached documents for additional  preventive care recommendations.

## 2024-09-19 NOTE — Progress Notes (Addendum)
 "  Chief Complaint  Patient presents with   Medicare Wellness     Subjective:   Joseph Mcdaniel is a 79 y.o. male who presents for a Medicare Annual Wellness Visit.  Visit info / Clinical Intake: Medicare Wellness Visit Type:: Subsequent Annual Wellness Visit Persons participating in visit and providing information:: patient Medicare Wellness Visit Mode:: In-person (required for WTM) Interpreter Needed?: No Pre-visit prep was completed: yes AWV questionnaire completed by patient prior to visit?: no Living arrangements:: lives with spouse/significant other Patient's Overall Health Status Rating: (!) fair Typical amount of pain: some Does pain affect daily life?: (!) yes Are you currently prescribed opioids?: no  Dietary Habits and Nutritional Risks How many meals a day?: 3 Eats fruit and vegetables daily?: yes Most meals are obtained by: preparing own meals In the last 2 weeks, have you had any of the following?: none Diabetic:: no  Functional Status Activities of Daily Living (to include ambulation/medication): Independent Ambulation: Independent with device- listed below Home Assistive Devices/Equipment: Eyeglasses; Cane; CPAP Medication Administration: Independent Home Management (perform basic housework or laundry): Independent Manage your own finances?: yes Primary transportation is: driving Concerns about vision?: no *vision screening is required for WTM* Concerns about hearing?: (!) yes Uses hearing aids?: no Hear whispered voice?: yes  Fall Screening Falls in the past year?: 1 Number of falls in past year: 1 Was there an injury with Fall?: 1 (right hip during hunting) Fall Risk Category Calculator: 3 Patient Fall Risk Level: High Fall Risk  Fall Risk Patient at Risk for Falls Due to: History of fall(s); Impaired balance/gait Fall risk Follow up: Falls evaluation completed  Home and Transportation Safety: All rugs have non-skid backing?: yes All stairs  or steps have railings?: yes Grab bars in the bathtub or shower?: (!) no Have non-skid surface in bathtub or shower?: yes Good home lighting?: yes Regular seat belt use?: yes Hospital stays in the last year:: no  Cognitive Assessment Difficulty concentrating, remembering, or making decisions? : yes Will 6CIT or Mini Cog be Completed: yes What year is it?: 0 points What month is it?: 0 points Give patient an address phrase to remember (5 components): [address] [place]  About what time is it?: 0 points Count backwards from 20 to 1: 0 points Say the months of the year in reverse: 0 points Repeat the address phrase from earlier: 0 points 6 CIT Score: 0 points  Advance Directives (For Healthcare) Does Patient Have a Medical Advance Directive?: No  Reviewed/Updated  Reviewed/Updated: Reviewed All (Medical, Surgical, Family, Medications, Allergies, Care Teams, Patient Goals)    Allergies (verified) Codeine, Hydrocodone, Naproxen, Omeprazole , Penicillin v, and Penicillins   Current Medications (verified) Outpatient Encounter Medications as of 09/19/2024  Medication Sig   aspirin  81 MG chewable tablet Chew 81 mg by mouth daily.   benzonatate  (TESSALON ) 100 MG capsule Take 1 capsule (100 mg total) by mouth 3 (three) times daily as needed.   Cholecalciferol (VITAMIN D) 125 MCG (5000 UT) CAPS Take 1 capsule by mouth daily.   fluticasone  (FLONASE ) 50 MCG/ACT nasal spray Place 2 sprays into both nostrils daily.   fluticasone  (FLOVENT  HFA) 220 MCG/ACT inhaler Inhale 1 puff into the lungs as needed.   meloxicam  (MOBIC ) 15 MG tablet Take 1 tablet (15 mg total) by mouth daily.   mineral oil liquid Take 15 mLs by mouth at bedtime as needed for moderate constipation.   Multiple Vitamin (MULTIVITAMIN WITH MINERALS) TABS tablet Take 1 tablet by mouth daily.  Psyllium 30.9 % POWD as directed Orally   tadalafil  (CIALIS ) 20 MG tablet TAKE 1 TABLET BY MOUTH EVERY OTHER DAY   doxycycline   (VIBRA -TABS) 100 MG tablet Take 1 tablet (100 mg total) by mouth 2 (two) times daily. (Patient not taking: Reported on 09/19/2024)   No facility-administered encounter medications on file as of 09/19/2024.    History: Past Medical History:  Diagnosis Date   A-fib (HCC) with spontaneous convert to NSR 08/2012   Adenomatous colon polyp 09/2007   Anxiety    Cardiac CT on 5/11, calcium score 0    Cataracts, bilateral    DDD (degenerative disc disease)    ED (erectile dysfunction)    Fibromyalgia    Generalized OA    GERD (gastroesophageal reflux disease)    Myalgia and myositis    OSA (obstructive sleep apnea), compliant with CPAP    Sleep apnea    cpap   Vitamin D deficiency    Past Surgical History:  Procedure Laterality Date   CATARACT EXTRACTION Right 02/2019   CHONDROPLASTY Left    COLONOSCOPY     LIPOMA EXCISION     LUMBAR LAMINECTOMY     POLYPECTOMY     ROTATOR CUFF REPAIR Right    UPPER GASTROINTESTINAL ENDOSCOPY     Family History  Problem Relation Age of Onset   Breast cancer Mother    Heart attack Father 75   Diabetes Maternal Aunt    Colon cancer Neg Hx    Colon polyps Neg Hx    Rectal cancer Neg Hx    Stomach cancer Neg Hx    Esophageal cancer Neg Hx    Pancreatic cancer Neg Hx    Social History   Occupational History   Occupation: Retired    Comment: PT @ [employer]  Tobacco Use   Smoking status: Former    Current packs/day: 0.00    Average packs/day: 1.5 packs/day for 30.0 years (45.0 ttl pk-yrs)    Types: Cigarettes    Start date: 09/08/1964    Quit date: 09/08/1994    Years since quitting: 30.0   Smokeless tobacco: Never   Tobacco comments:    Quit smoking 1996, smoked for 30 years, pt smoke 3 packs per day for 1-2 years.   Vaping Use   Vaping status: Never Used  Substance and Sexual Activity   Alcohol use: Yes    Alcohol/week: 3.0 standard drinks of alcohol    Types: 3 Cans of beer per week    Comment: occ beer on weekends   Drug use: No    Sexual activity: Not on file   Tobacco Counseling Counseling given: Not Answered Tobacco comments: Quit smoking 1996, smoked for 30 years, pt smoke 3 packs per day for 1-2 years.   SDOH Screenings   Food Insecurity: No Food Insecurity (09/19/2024)  Housing: Low Risk (09/19/2024)  Transportation Needs: No Transportation Needs (09/19/2024)  Utilities: Not At Risk (09/19/2024)  Alcohol Screen: Low Risk (11/23/2023)  Depression (PHQ2-9): Low Risk (09/19/2024)  Financial Resource Strain: Low Risk (11/23/2023)  Physical Activity: Inactive (09/19/2024)  Social Connections: Socially Integrated (09/19/2024)  Stress: No Stress Concern Present (09/19/2024)  Tobacco Use: Medium Risk (09/19/2024)  Health Literacy: Adequate Health Literacy (09/19/2024)   See flowsheets for full screening details  Depression Screen PHQ 2 & 9 Depression Scale- Over the past 2 weeks, how often have you been bothered by any of the following problems? Little interest or pleasure in doing things: 0 Feeling down, depressed, or hopeless (  PHQ Adolescent also includes...irritable): 0 PHQ-2 Total Score: 0     Goals Addressed               This Visit's Progress     Patient Stated        Patient Stated (pt-stated)        Maintain health and activity until 100              Objective:    Today's Vitals   09/19/24 1303  BP: 110/68  Pulse: 68  Temp: 98.2 F (36.8 C)  SpO2: 99%  Weight: 228 lb (103.4 kg)  Height: 5' 9 (1.753 m)  PainSc: 8   PainLoc: Hip   Body mass index is 33.67 kg/m.  Hearing/Vision screen Hearing Screening - Comments:: Pt stated HOH no hearing aids  Vision Screening - Comments:: Wears rx glasses - not up to date with routine eye exams with Dr Octavia encouraged to follow up  Immunizations and Health Maintenance Health Maintenance  Topic Date Due   Hepatitis C Screening  Never done   Influenza Vaccine  12/06/2024 (Originally 04/08/2024)   Medicare Annual Wellness (AWV)  09/19/2025    DTaP/Tdap/Td (2 - Td or Tdap) 11/06/2027   Pneumococcal Vaccine: 50+ Years  Completed   Zoster Vaccines- Shingrix   Completed   Meningococcal B Vaccine  Aged Out   Colonoscopy  Discontinued   COVID-19 Vaccine  Discontinued        Assessment/Plan:  This is a routine wellness examination for Bertis.  Patient Care Team: Kennyth Worth HERO, MD as PCP - General (Family Medicine) Lavona Agent, MD as PCP - Cardiology (Cardiology) Gerome Charleston, MD (Inactive) as Consulting Physician (Orthopedic Surgery)  I have personally reviewed and noted the following in the patients chart:   Medical and social history Use of alcohol, tobacco or illicit drugs  Current medications and supplements including opioid prescriptions. Functional ability and status Nutritional status Physical activity Advanced directives List of other physicians Hospitalizations, surgeries, and ER visits in previous 12 months Vitals Screenings to include cognitive, depression, and falls Referrals and appointments  No orders of the defined types were placed in this encounter.  In addition, I have reviewed and discussed with patient certain preventive protocols, quality metrics, and best practice recommendations. A written personalized care plan for preventive services as well as general preventive health recommendations were provided to patient.   Ellouise VEAR Haws, LPN   8/87/7973   Return in about 1 year (around 09/19/2025).  After Visit Summary: (In Person-Printed) AVS printed and given to the patient  Nurse Notes: HM Addressed: Labs Due and discussed and follow up with physical on 01/17/25   "

## 2024-09-26 ENCOUNTER — Ambulatory Visit: Admitting: Family

## 2024-09-26 ENCOUNTER — Encounter: Payer: Self-pay | Admitting: Family

## 2024-09-26 VITALS — BP 100/62 | HR 66 | Temp 99.9°F | Ht 69.0 in | Wt 227.8 lb

## 2024-09-26 DIAGNOSIS — R051 Acute cough: Secondary | ICD-10-CM

## 2024-09-26 DIAGNOSIS — R509 Fever, unspecified: Secondary | ICD-10-CM | POA: Diagnosis not present

## 2024-09-26 DIAGNOSIS — J329 Chronic sinusitis, unspecified: Secondary | ICD-10-CM | POA: Diagnosis not present

## 2024-09-26 LAB — POCT INFLUENZA A/B
Influenza A, POC: NEGATIVE
Influenza B, POC: NEGATIVE

## 2024-09-26 LAB — POC COVID19 BINAXNOW: SARS Coronavirus 2 Ag: NEGATIVE

## 2024-09-26 MED ORDER — DOXYCYCLINE HYCLATE 100 MG PO TABS: 100.0000 mg | ORAL_TABLET | Freq: Two times a day (BID) | ORAL | 0 refills | Status: AC

## 2024-09-26 MED ORDER — BENZONATATE 100 MG PO CAPS: 100.0000 mg | ORAL_CAPSULE | Freq: Three times a day (TID) | ORAL | 0 refills | Status: AC | PRN

## 2024-09-26 NOTE — Progress Notes (Signed)
 "  Patient ID: Joseph Mcdaniel, male    DOB: Oct 29, 1945, 79 y.o.   MRN: [id number]  Chief Complaint  Patient presents with   Cough  Discussed the use of AI scribe software for clinical note transcription with the patient, who gave verbal consent to proceed.  History of Present Illness Joseph Mcdaniel is a 79 year old male with a history of sinus issues and sleep apnea who presents with sinus congestion and cough.  He has had frontal sinus congestion, dry yellow nasal drainage, and headache for 1 week. Yesterday he developed a worsening cough productive of bright yellow sputum. He had a similar episode in November that improved but did not fully resolve with doxycycline  and a liquid cough medicine. He uses Flonase  at night and has taken Claritin, which still helps his symptoms. He is allergic to penicillin, which causes a rash. He noted a maximum temperature of 99.30F today. He denies ear pain.  Assessment & Plan Chronic sinusitis, cough, fever Headache, yellow nasal drainage, congestion, and productive cough with yellow sputum for a week. Previous sinus infection treated with doxycycline  2 months ago. Allergic to penicillin. ENT referral needed for recurrent sinus issues. - Prescribed doxycycline  100mg  x7d, twice daily. - Prescribed Tessalon  Perles 100mg  tid prn for cough. - Advised to increase use of Flonase  twice daily for one week. - Referred to ENT for further evaluation. - Encouraged increased fluid intake. - Call back if sx are persisting.   Subjective:    Outpatient Medications Prior to Visit  Medication Sig Dispense Refill   aspirin  81 MG chewable tablet Chew 81 mg by mouth daily.     benzonatate  (TESSALON ) 100 MG capsule Take 1 capsule (100 mg total) by mouth 3 (three) times daily as needed. 30 capsule 0   Cholecalciferol (VITAMIN D) 125 MCG (5000 UT) CAPS Take 1 capsule by mouth daily.     doxycycline  (VIBRA -TABS) 100 MG tablet Take 1 tablet (100 mg total) by mouth 2 (two)  times daily. 14 tablet 0   fluticasone  (FLONASE ) 50 MCG/ACT nasal spray Place 2 sprays into both nostrils daily. 16 g 0   fluticasone  (FLOVENT  HFA) 220 MCG/ACT inhaler Inhale 1 puff into the lungs as needed.     meloxicam  (MOBIC ) 15 MG tablet Take 1 tablet (15 mg total) by mouth daily. 30 tablet 0   mineral oil liquid Take 15 mLs by mouth at bedtime as needed for moderate constipation.     Multiple Vitamin (MULTIVITAMIN WITH MINERALS) TABS tablet Take 1 tablet by mouth daily.     Psyllium 30.9 % POWD as directed Orally     tadalafil  (CIALIS ) 20 MG tablet TAKE 1 TABLET BY MOUTH EVERY OTHER DAY 30 tablet 0   No facility-administered medications prior to visit.   Past Medical History:  Diagnosis Date   A-fib [facility]) with spontaneous convert to NSR 08/2012   Adenomatous colon polyp 09/2007   Anxiety    Cardiac CT on 5/11, calcium score 0    Cataracts, bilateral    DDD (degenerative disc disease)    ED (erectile dysfunction)    Fibromyalgia    Generalized OA    GERD (gastroesophageal reflux disease)    Myalgia and myositis    OSA (obstructive sleep apnea), compliant with CPAP    Sleep apnea    cpap   Vitamin D deficiency    Past Surgical History:  Procedure Laterality Date   CATARACT EXTRACTION Right 02/2019   CHONDROPLASTY Left    COLONOSCOPY  LIPOMA EXCISION     LUMBAR LAMINECTOMY     POLYPECTOMY     ROTATOR CUFF REPAIR Right    UPPER GASTROINTESTINAL ENDOSCOPY     Allergies[1]    Objective:    Physical Exam Vitals and nursing note reviewed.  Constitutional:      General: He is not in acute distress.    Appearance: Normal appearance. He is not ill-appearing.  HENT:     Head: Normocephalic.     Right Ear: Tympanic membrane and ear canal normal.     Left Ear: Tympanic membrane and ear canal normal.     Nose:     Right Sinus: Frontal sinus tenderness present. No maxillary sinus tenderness.     Left Sinus: Frontal sinus tenderness present. No maxillary sinus  tenderness.     Mouth/Throat:     Mouth: Mucous membranes are moist.     Pharynx: Posterior oropharyngeal erythema and postnasal drip present. No pharyngeal swelling, oropharyngeal exudate or uvula swelling.     Tonsils: No tonsillar exudate or tonsillar abscesses.  Cardiovascular:     Rate and Rhythm: Normal rate and regular rhythm.  Pulmonary:     Effort: Pulmonary effort is normal.     Breath sounds: Normal breath sounds.  Musculoskeletal:        General: Normal range of motion.     Cervical back: Normal range of motion.  Lymphadenopathy:     Head:     Right side of head: No preauricular or posterior auricular adenopathy.     Left side of head: No preauricular or posterior auricular adenopathy.     Cervical: No cervical adenopathy.  Skin:    General: Skin is warm and dry.  Neurological:     Mental Status: He is alert and oriented to person, place, and time.  Psychiatric:        Mood and Affect: Mood normal.    BP 100/62   Pulse 66   Temp 99.9 F (37.7 C)   Ht 5' 9 (1.753 m)   Wt 227 lb 12.8 oz (103.3 kg)   SpO2 99%   BMI 33.64 kg/m  Wt Readings from Last 3 Encounters:  09/26/24 227 lb 12.8 oz (103.3 kg)  09/19/24 228 lb (103.4 kg)  07/18/24 230 lb 9.6 oz (104.6 kg)      Abbagale Goguen, NP     [1]  Allergies Allergen Reactions   Codeine Itching   Hydrocodone Itching   Naproxen Other (See Comments)   Omeprazole  Itching    Reaction unknown Reaction unknown   Penicillin V Other (See Comments)   Penicillins Hives and Itching    Has patient had a PCN reaction causing immediate rash, facial/tongue/throat swelling, SOB or lightheadedness with hypotension: Yes Has patient had a PCN reaction causing severe rash involving mucus membranes or skin necrosis: No Has patient had a PCN reaction that required hospitalization: No Has patient had a PCN reaction occurring within the last 10 years: No If all of the above answers are NO, then may proceed with  Cephalosporin use.    "

## 2024-10-20 ENCOUNTER — Institutional Professional Consult (permissible substitution) (INDEPENDENT_AMBULATORY_CARE_PROVIDER_SITE_OTHER): Admitting: Otolaryngology

## 2025-01-17 ENCOUNTER — Encounter: Admitting: Family Medicine

## 2025-09-21 ENCOUNTER — Ambulatory Visit
